# Patient Record
Sex: Female | Born: 1940
Health system: Southern US, Community
[De-identification: ages and names within clinical notes are randomized; demographics above are authoritative.]

## PROBLEM LIST (undated history)

## (undated) DIAGNOSIS — N390 Urinary tract infection, site not specified: Secondary | ICD-10-CM

## (undated) DIAGNOSIS — K5792 Diverticulitis of intestine, part unspecified, without perforation or abscess without bleeding: Secondary | ICD-10-CM

## (undated) DIAGNOSIS — K284 Chronic or unspecified gastrojejunal ulcer with hemorrhage: Secondary | ICD-10-CM

## (undated) DIAGNOSIS — E785 Hyperlipidemia, unspecified: Secondary | ICD-10-CM

## (undated) DIAGNOSIS — Q249 Congenital malformation of heart, unspecified: Secondary | ICD-10-CM

## (undated) DIAGNOSIS — M1990 Unspecified osteoarthritis, unspecified site: Secondary | ICD-10-CM

## (undated) DIAGNOSIS — F329 Major depressive disorder, single episode, unspecified: Secondary | ICD-10-CM

## (undated) DIAGNOSIS — K274 Chronic or unspecified peptic ulcer, site unspecified, with hemorrhage: Secondary | ICD-10-CM

## (undated) DIAGNOSIS — K55039 Acute (reversible) ischemia of large intestine, extent unspecified: Secondary | ICD-10-CM

## (undated) DIAGNOSIS — R51 Headache: Secondary | ICD-10-CM

## (undated) DIAGNOSIS — F32A Depression, unspecified: Secondary | ICD-10-CM

## (undated) DIAGNOSIS — K921 Melena: Secondary | ICD-10-CM

## (undated) DIAGNOSIS — I10 Essential (primary) hypertension: Secondary | ICD-10-CM

## (undated) HISTORY — DX: Hyperlipidemia, unspecified: E78.5

## (undated) HISTORY — DX: Congenital malformation of heart, unspecified: Q24.9

## (undated) HISTORY — DX: Headache: R51

## (undated) HISTORY — DX: Acute (reversible) ischemia of large intestine, extent unspecified: K55.039

## (undated) HISTORY — DX: Essential (primary) hypertension: I10

## (undated) HISTORY — DX: Unspecified osteoarthritis, unspecified site: M19.90

## (undated) HISTORY — DX: Chronic or unspecified peptic ulcer, site unspecified, with hemorrhage: K27.4

## (undated) HISTORY — DX: Major depressive disorder, single episode, unspecified: F32.9

## (undated) HISTORY — DX: Urinary tract infection, site not specified: N39.0

## (undated) HISTORY — DX: Diverticulitis of intestine, part unspecified, without perforation or abscess without bleeding: K57.92

## (undated) HISTORY — DX: Depression, unspecified: F32.A

## (undated) HISTORY — DX: Melena: K92.1

## (undated) HISTORY — PX: OTHER SURGICAL HISTORY: SHX169

## (undated) HISTORY — DX: Chronic or unspecified gastrojejunal ulcer with hemorrhage: K28.4

---

## 2000-06-02 ENCOUNTER — Encounter: Admission: RE | Admit: 2000-06-02 | Discharge: 2000-06-02 | Payer: Self-pay | Admitting: Internal Medicine

## 2005-12-09 ENCOUNTER — Emergency Department (HOSPITAL_COMMUNITY): Admission: EM | Admit: 2005-12-09 | Discharge: 2005-12-09 | Payer: Self-pay | Admitting: Emergency Medicine

## 2009-09-23 ENCOUNTER — Ambulatory Visit: Payer: Self-pay | Admitting: Cardiology

## 2009-11-04 ENCOUNTER — Ambulatory Visit: Payer: Self-pay | Admitting: Cardiology

## 2010-01-18 DIAGNOSIS — K55039 Acute (reversible) ischemia of large intestine, extent unspecified: Secondary | ICD-10-CM

## 2010-01-18 HISTORY — DX: Acute (reversible) ischemia of large intestine, extent unspecified: K55.039

## 2010-01-22 ENCOUNTER — Inpatient Hospital Stay (HOSPITAL_COMMUNITY)
Admission: EM | Admit: 2010-01-22 | Discharge: 2010-01-23 | Payer: Self-pay | Source: Home / Self Care | Attending: Internal Medicine | Admitting: Internal Medicine

## 2010-01-22 LAB — MRSA PCR SCREENING: MRSA by PCR: NEGATIVE

## 2010-01-22 LAB — CBC
HCT: 40.9 % (ref 36.0–46.0)
HCT: 36.4 % (ref 36.0–46.0)
Hemoglobin: 13.4 g/dL (ref 12.0–15.0)
Hemoglobin: 11.8 g/dL — ABNORMAL LOW (ref 12.0–15.0)
MCH: 30.4 pg (ref 26.0–34.0)
MCH: 30.2 pg (ref 26.0–34.0)
MCHC: 32.8 g/dL (ref 30.0–36.0)
MCHC: 32.4 g/dL (ref 30.0–36.0)
MCV: 92.7 fL (ref 78.0–100.0)
MCV: 93.1 fL (ref 78.0–100.0)
Platelets: 209 10*3/uL (ref 150–400)
Platelets: 237 10*3/uL (ref 150–400)
RBC: 4.41 MIL/uL (ref 3.87–5.11)
RBC: 3.91 MIL/uL (ref 3.87–5.11)
RDW: 12.6 % (ref 11.5–15.5)
RDW: 12.8 % (ref 11.5–15.5)
WBC: 13.4 10*3/uL — ABNORMAL HIGH (ref 4.0–10.5)
WBC: 12.9 10*3/uL — ABNORMAL HIGH (ref 4.0–10.5)

## 2010-01-22 LAB — COMPREHENSIVE METABOLIC PANEL
ALT: 27 U/L (ref 0–35)
AST: 23 U/L (ref 0–37)
Albumin: 4.8 g/dL (ref 3.5–5.2)
Alkaline Phosphatase: 69 U/L (ref 39–117)
BUN: 40 mg/dL — ABNORMAL HIGH (ref 6–23)
CO2: 22 mEq/L (ref 19–32)
Calcium: 10.3 mg/dL (ref 8.4–10.5)
Chloride: 104 mEq/L (ref 96–112)
Creatinine, Ser: 1.44 mg/dL — ABNORMAL HIGH (ref 0.4–1.2)
GFR calc Af Amer: 44 mL/min — ABNORMAL LOW (ref >60)
GFR calc non Af Amer: 36 mL/min — ABNORMAL LOW (ref >60)
Glucose, Bld: 155 mg/dL — ABNORMAL HIGH (ref 70–99)
Potassium: 4.1 mEq/L (ref 3.5–5.1)
Sodium: 137 mEq/L (ref 135–145)
Total Bilirubin: 0.6 mg/dL (ref 0.3–1.2)
Total Protein: 8.6 g/dL — ABNORMAL HIGH (ref 6.0–8.3)

## 2010-01-22 LAB — PROTIME-INR
INR: 1.02 (ref 0.00–1.49)
Prothrombin Time: 13.6 seconds (ref 11.6–15.2)

## 2010-01-22 LAB — ABO/RH: ABO/RH(D): O POS

## 2010-01-22 LAB — APTT: aPTT: 31 seconds (ref 24–37)

## 2010-01-22 LAB — LACTIC ACID, PLASMA: Lactic Acid, Venous: 1.3 mmol/L (ref 0.5–2.2)

## 2010-01-22 LAB — TYPE AND SCREEN
ABO/RH(D): O POS
Antibody Screen: NEGATIVE

## 2010-01-23 ENCOUNTER — Encounter (INDEPENDENT_AMBULATORY_CARE_PROVIDER_SITE_OTHER): Payer: Self-pay | Admitting: Internal Medicine

## 2010-01-23 LAB — URINALYSIS, ROUTINE W REFLEX MICROSCOPIC
Bilirubin Urine: NEGATIVE
Hemoglobin, Urine: NEGATIVE
Ketones, ur: 15 mg/dL — AB
Nitrite: NEGATIVE
Protein, ur: NEGATIVE mg/dL
Specific Gravity, Urine: 1.012 (ref 1.005–1.030)
Urine Glucose, Fasting: NEGATIVE mg/dL
Urobilinogen, UA: 0.2 mg/dL (ref 0.0–1.0)
pH: 5.5 (ref 5.0–8.0)

## 2010-01-23 LAB — DIFFERENTIAL
Basophils Absolute: 0 10*3/uL (ref 0.0–0.1)
Basophils Relative: 0 % (ref 0–1)
Eosinophils Absolute: 0.1 10*3/uL (ref 0.0–0.7)
Eosinophils Relative: 1 % (ref 0–5)
Lymphocytes Relative: 17 % (ref 12–46)
Lymphs Abs: 1.7 10*3/uL (ref 0.7–4.0)
Monocytes Absolute: 1 10*3/uL (ref 0.1–1.0)
Monocytes Relative: 10 % (ref 3–12)
Neutro Abs: 7.4 10*3/uL (ref 1.7–7.7)
Neutrophils Relative %: 72 % (ref 43–77)

## 2010-01-23 LAB — BASIC METABOLIC PANEL
BUN: 18 mg/dL (ref 6–23)
CO2: 24 mEq/L (ref 19–32)
Calcium: 8.8 mg/dL (ref 8.4–10.5)
Chloride: 110 mEq/L (ref 96–112)
Creatinine, Ser: 0.99 mg/dL (ref 0.4–1.2)
GFR calc Af Amer: >60 mL/min (ref >60)
GFR calc non Af Amer: 56 mL/min — ABNORMAL LOW (ref >60)
Glucose, Bld: 114 mg/dL — ABNORMAL HIGH (ref 70–99)
Potassium: 4.2 mEq/L (ref 3.5–5.1)
Sodium: 141 mEq/L (ref 135–145)

## 2010-01-23 LAB — CBC
HCT: 31.9 % — ABNORMAL LOW (ref 36.0–46.0)
HCT: 32.2 % — ABNORMAL LOW (ref 36.0–46.0)
Hemoglobin: 10.2 g/dL — ABNORMAL LOW (ref 12.0–15.0)
Hemoglobin: 10.3 g/dL — ABNORMAL LOW (ref 12.0–15.0)
MCH: 30.1 pg (ref 26.0–34.0)
MCH: 30.3 pg (ref 26.0–34.0)
MCHC: 32 g/dL (ref 30.0–36.0)
MCHC: 32 g/dL (ref 30.0–36.0)
MCV: 94.1 fL (ref 78.0–100.0)
MCV: 94.7 fL (ref 78.0–100.0)
Platelets: 149 10*3/uL — ABNORMAL LOW (ref 150–400)
Platelets: 187 10*3/uL (ref 150–400)
RBC: 3.39 MIL/uL — ABNORMAL LOW (ref 3.87–5.11)
RBC: 3.4 MIL/uL — ABNORMAL LOW (ref 3.87–5.11)
RDW: 13 % (ref 11.5–15.5)
RDW: 13 % (ref 11.5–15.5)
WBC: 10.3 10*3/uL (ref 4.0–10.5)
WBC: 10.8 10*3/uL — ABNORMAL HIGH (ref 4.0–10.5)

## 2010-01-23 LAB — LIPID PANEL
Cholesterol: 132 mg/dL (ref 0–200)
HDL: 58 mg/dL (ref >39)
LDL Cholesterol: 61 mg/dL (ref 0–99)
Total CHOL/HDL Ratio: 2.3 RATIO
Triglycerides: 67 mg/dL (ref <150)
VLDL: 13 mg/dL (ref 0–40)

## 2010-02-02 LAB — URINE CULTURE
Colony Count: 75000
Culture  Setup Time: 201201061139
Special Requests: NEGATIVE

## 2010-02-06 NOTE — H&P (Signed)
Madison Hernandez, Madison Hernandez               ACCOUNT NO.:  [id number]  MEDICAL RECORD NO.:  [id number]          PATIENT TYPE:  EMS  LOCATION:  ED                           FACILITY:  [facility]  PHYSICIAN:  Hartley Barefoot, MD    DATE OF BIRTH:  1940-11-13  DATE OF ADMISSION:  01/22/2010 DATE OF DISCHARGE:                             HISTORY & PHYSICAL   PRIMARY CARE PROVIDER:  [facility].  CARDIOLOGIST:  Colleen Can. Deborah Chalk, MD  The patient is being admitted triad hospitalist [facility].  CHIEF COMPLAINT:  Bright red blood per rectum and abdominal pain.  HISTORY OF PRESENT ILLNESS:  [name] is a very pleasant 70 year old female with a history of hypertension, hypercholesterolemia, and remote peptic ulcer disease, who presented to the [facility] ED this morning from home with a chief complaint of abdominal pain and bright red blood per rectum.  Information is obtained from the patient.  She states that she was in her usual state of health until she was awakened late last night with diffuse abdominal pain.  She describes the pain as sharp, crampy like.  She felt urge to have a BM that was loose, but she is unsure of the color as she did not look.  She states that she then became nauseated, diaphoretic.  Shortly thereafter, she vomited a small amount of emesis and again did not look, so she is unsure of its color or if it had any coffee-ground substance.  She states this episode lasted about 45 minutes.  She then went back to bed, rested for several hours, and later had a reoccurrence of the abdominal pain and another large loose dark stool as well as bright red blood.  She indicates that she saw bright red blood in her stool as well as on her toilet paper. She also indicates that the stool was very dark.  She then called for assistance and came to the emergency room.  She denies any chest pain, palpitations, shortness of breath.  She does endorse some chills and indicates that she  has suffered with chronic back pain and takes 2 to 3 Aleve 3 to 5 days a week and has done so for prolonged period of time. Information from the patient is quite mixed in terms of details around time frames and clinical symptoms.  She indicates that about 25 years ago she was hospitalized for "bleeding ulcer."  She has not had colonoscopy that she can remember or endoscopy.  She had 1 small BM while in the emergency room that did have bright red blood.  Symptoms came on suddenly, have persisted or characterized as moderate to severe. We are asked to admit for further evaluation and treatment.  ALLERGIES:  No known drug allergies.  PAST MEDICAL HISTORY: 1. Hypertension. 2. Hypercholesterolemia. 3. Chronic back pain. 4. Remote history of peptic ulcer disease.  PAST SURGICAL HISTORY:  None.  FAMILY MEDICAL HISTORY:  Her mother was deceased.  She was in her 90s. She had diabetes and hypertension and died of an MI.  Father was deceased and his medical history is unknown.  She has 3 siblings, they  are collective.  Medical history is positive for hypertension.  Negative for heart disease, GI bleed, stroke, cancer.  SOCIAL HISTORY:  She lives alone.  She is divorced.  She denies tobacco use.  Denies EtOH.  Denies drug use.  She is retired from [employer].  MEDICATIONS: 1. Crestor 10 mg p.o. daily 2. Toprol-XL 50 mg p.o. daily. 3. Amlodipine and benazepril 5/20 mg p.o. daily.  REVIEW OF SYSTEMS:  10-point system review is negative except for as stated in HPI.  LABORATORY DATA:  Sodium 137, potassium 4.1, chloride 104, CO2 22, BUN 40, creatinine 1.44, glucose 155.  WBC 13.4, hemoglobin 13.4, hematocrit 40.9, platelets 209.  PTT 31, PT 13.6, INR 1.02.  FOBT positive.  RADIOLOGY:  Acute abdominal series yield no acute findings.  PHYSICAL EXAMINATION:  VITAL SIGNS:  T 97.4, BP 123/64, HR 103, respiration 18, sats 97. GENERAL:  Awake, alert, well nourished, somewhat ill  appearing, no acute distress. HEENT:  Head, normocephalic, atraumatic.  Pupils equal, round, reactive to light.  EOMI.  Mucous membranes of her mouth are moist and pink.  No obvious lesion or exudate in her nose or ears. NECK:  Supple.  No JVD.  Full range of motion.  No lymphadenopathy. CV: Tachycardic, but regular.  No murmur, gallop, or rub.  No lower extremity edema.  Pedal pulses present and palpable. RESPIRATORY:  No increased work of breathing.  Breath sounds clear to auscultation bilaterally.  No rhonchi, wheezes, or rales. ABDOMEN:  Round, soft.  Positive bowel sounds, but sluggish.  Mild tenderness to palpation.  No mass or organomegaly noted. NEURO:  Alert and oriented x3.  Speech clear.  Facial symmetry. MUSCULOSKELETAL:  Moves all extremities.  No joint swelling/erythema. Full range of motion.  ASSESSMENT AND PLAN: 1. Bright red blood per rectum and melena, etiology unclear. Differential includes    diverticular bleed, peptic ulcer, gastritis, hemorrhoids, Ischemic colitis.     We will admit to step-down. We will get serial CBCs. We will provide Protonix.      We will keep n.p.o.  We will type and screen.  We will request a GI consult.  2. Abdominal pain. Differential: colitis, PUD, Gastritis.      We will hold off on a CT scan given her creatinine is 1.5,     request a GI consult, provide pain medicines. Will check lactic acid.  3. Leukocytosis.  We will workup for infection, may be secondary to     infectious colitis versus stress.  We will check a UA, lactoferrin,     We will cover with Cipro and Flagyl for now. 4. Acute renal failure, creatinine 1.4, probably prerenal secondary to     dehydration/diarrhea.  We will hold nephrotoxins.  We will gently     hydrate with IV fluids. We will check BMET in the a.m.      Increase BUN could be from upper GI bleed.  5. Hypertension.  Blood pressure is currently 123/64.  We will hold     antihypertensives for now. 6.  Hypercholesterolemia.  We will check a fasting lipid panel in the     a.m.  We will hold Crestor for now secondary to #1. 7. Deep venous thrombosis prophylaxis, we will use SCDs. 8. Code status.  The patient is a full code.  This assessment and plan was discussed with Dr. Sunnie Nielsen.     Gwenyth Bender, NP   ______________________________ Hartley Barefoot, MD    KMB/MEDQ  D:  01/22/2010  T:  01/22/2010  Job:  [id number]  cc:   Colleen Can. Deborah Chalk, M.D. Fax: [phone]  [facility]  Electronically Signed by Hartley Barefoot MD on 01/22/2010 05:59:52 PM Electronically Signed by Toya Smothers  on 02/06/2010 08:25:50 AM

## 2010-02-12 LAB — OCCULT BLOOD, POC DEVICE: Fecal Occult Bld: POSITIVE

## 2010-04-13 ENCOUNTER — Ambulatory Visit: Payer: Self-pay | Admitting: Cardiology

## 2010-04-29 ENCOUNTER — Telehealth: Payer: Self-pay | Admitting: Cardiology

## 2010-04-29 NOTE — Telephone Encounter (Signed)
Spoke with Britta Mccreedy (pt's sister) and RN will fax [facility] for Amlodipine 5 mg and Benazepril 20 mg one po daily #90 with three refills.

## 2010-04-29 NOTE — Telephone Encounter (Signed)
ABOUT AMLOIPINE/DRUG PLAN WILL NOT APPROVE BUT WILL IF SEPARATED AT NO CHARGE. CHART IN BOX.

## 2010-05-26 ENCOUNTER — Emergency Department (HOSPITAL_COMMUNITY)
Admission: EM | Admit: 2010-05-26 | Discharge: 2010-05-26 | Disposition: A | Discharge status: Home / Self Care | Payer: Medicare Other | Attending: Emergency Medicine | Admitting: Emergency Medicine

## 2010-05-26 ENCOUNTER — Emergency Department (HOSPITAL_COMMUNITY): Payer: Medicare Other

## 2010-05-26 DIAGNOSIS — R51 Headache: Secondary | ICD-10-CM | POA: Insufficient documentation

## 2010-05-26 DIAGNOSIS — I10 Essential (primary) hypertension: Secondary | ICD-10-CM | POA: Insufficient documentation

## 2010-05-26 DIAGNOSIS — E7800 Pure hypercholesterolemia, unspecified: Secondary | ICD-10-CM | POA: Insufficient documentation

## 2010-06-21 LAB — HM COLONOSCOPY

## 2010-07-07 ENCOUNTER — Encounter: Payer: Self-pay | Admitting: Cardiovascular Disease

## 2010-07-08 ENCOUNTER — Encounter: Payer: Self-pay | Admitting: Cardiovascular Disease

## 2010-07-08 ENCOUNTER — Ambulatory Visit (INDEPENDENT_AMBULATORY_CARE_PROVIDER_SITE_OTHER): Payer: Medicare Other | Admitting: Cardiovascular Disease

## 2010-07-08 DIAGNOSIS — E785 Hyperlipidemia, unspecified: Secondary | ICD-10-CM

## 2010-07-08 DIAGNOSIS — Z79899 Other long term (current) drug therapy: Secondary | ICD-10-CM

## 2010-07-08 DIAGNOSIS — E782 Mixed hyperlipidemia: Secondary | ICD-10-CM

## 2010-07-08 DIAGNOSIS — R9431 Abnormal electrocardiogram [ECG] [EKG]: Secondary | ICD-10-CM

## 2010-07-08 DIAGNOSIS — R002 Palpitations: Secondary | ICD-10-CM

## 2010-07-08 DIAGNOSIS — I10 Essential (primary) hypertension: Secondary | ICD-10-CM | POA: Insufficient documentation

## 2010-07-08 LAB — LIPID PANEL
HDL: 57 mg/dL (ref >39.00)
Total CHOL/HDL Ratio: 5
Triglycerides: 158 mg/dL — ABNORMAL HIGH (ref 0.0–149.0)

## 2010-07-08 LAB — HEPATIC FUNCTION PANEL
ALT: 31 U/L (ref 0–35)
Albumin: 4.7 g/dL (ref 3.5–5.2)
Total Bilirubin: 0.5 mg/dL (ref 0.3–1.2)

## 2010-07-08 LAB — LDL CHOLESTEROL, DIRECT: Direct LDL: 228.2 mg/dL

## 2010-07-08 NOTE — Assessment & Plan Note (Signed)
Well controlled.  Continue current medications and low sodium Dash type diet.    

## 2010-07-08 NOTE — Patient Instructions (Signed)
Your physician recommends that you schedule a follow-up appointment in: 6 MONTHS WITH DR [facility]  Your physician recommends that you continue on your current medications as directed. Please refer to the Current Medication list given to you today.  Your physician recommends that you return for lab work in: TODAY LIPID LIVER DX 272.4 V58.69

## 2010-07-08 NOTE — Assessment & Plan Note (Signed)
Cholesterol is at goal.  Continue current dose of statin and diet Rx.  No myalgias or side effects.  F/U  LFT's in 6 months. Lab Results  Component Value Date   LDLCALC  Value: 61        Total Cholesterol/HDL:CHD Risk Coronary Heart Disease Risk Table                     Men   Women  1/2 Average Risk   3.4   3.3  Average Risk       5.0   4.4  2 X Average Risk   9.6   7.1  3 X Average Risk  23.4   11.0        Use the calculated Patient Ratio above and the CHD Risk Table to determine the patient's CHD Risk.        ATP III CLASSIFICATION (LDL):  <100     mg/dL   Optimal  161-096  mg/dL   Near or Above                    Optimal  130-159  mg/dL   Borderline  045-409  mg/dL   High  >811     mg/dL   Very High 09/18/4780  Will have lab work today. No myalgias

## 2010-07-08 NOTE — Progress Notes (Signed)
70 yo divorced femal new to me.  Previously seen by Dr Deborah Chalk.  Reviewed his office notes for the past 3 years.  History of palpitations improved with beta blocker.  CRF;s HTN and elevated lipids.  No real primary care doctor.  Goes to urgent care sporadically.  Dr Deborah Chalk usually checks lab work.  Needs lipid and liver today.  On stable BP regiman for last 2 years except calcium blocker and ACE separated.  Caffeine makes palpitatons worse and she tries to minimize.  No known structural heart disease.  Previous echo and ETT normal in last 5 years.  No SSCP, syncope.  Mild LE edema with previous vein procedures in both legs ? Varicosities  ROS: Denies fever, malais, weight loss, blurry vision, decreased visual acuity, cough, sputum, SOB, hemoptysis, pleuritic pain, palpitaitons, heartburn, abdominal pain, melena, lower extremity edema, claudication, or rash.  All other systems reviewed and negative   General: Affect appropriate Healthy:  appears stated age HEENT: normal Neck supple with no adenopathy JVP normal no bruits no thyromegaly Lungs clear with no wheezing and good diaphragmatic motion Heart:  S1/S2 no murmur,rub, gallop or click PMI normal Abdomen: benighn, BS positve, no tenderness, no AAA no bruit.  No HSM or HJR Distal pulses intact with no bruits No edema Neuro non-focal Skin warm and dry No muscular weakness  Medications Current Outpatient Prescriptions  Medication Sig Dispense Refill  . amLODipine (NORVASC) 5 MG tablet Take 5 mg by mouth daily.        . benazepril (LOTENSIN) 20 MG tablet Take 20 mg by mouth daily.        . metoprolol (TOPROL-XL) 50 MG 24 hr tablet Take 50 mg by mouth daily.        . rosuvastatin (CRESTOR) 10 MG tablet Take 10 mg by mouth daily.          Allergies Review of patient's allergies indicates no known allergies.  Family History: Family History  Problem Relation Age of Onset  . Heart disease Mother     age 9    Social  History: History   Social History  . Marital Status: Single    Spouse Name: N/A    Number of Children: N/A  . Years of Education: N/A   Occupational History  . Not on file.   Social History Main Topics  . Smoking status: Former Smoker    Types: Cigarettes    Quit date: 01/19/1983  . Smokeless tobacco: Never Used  . Alcohol Use: Not on file  . Drug Use: Not on file  . Sexually Active: Not on file   Other Topics Concern  . Not on file   Social History Narrative   She retired early. She is non smoker. Never knew her father. Her mother died age 49 of heart disease. Her half sister Lucrezia [place]    Electrocardiogram:  NSR 60 Nonspecific ST/T wave changes QT 478  Assessment and Plan

## 2010-07-08 NOTE — Assessment & Plan Note (Signed)
Nonspecific chagnes.  Compared to one from [facility] Ed office a year ago and no change

## 2010-07-08 NOTE — Assessment & Plan Note (Signed)
Improved on beta blocker  

## 2010-07-20 ENCOUNTER — Telehealth: Payer: Self-pay | Admitting: Cardiovascular Disease

## 2010-07-20 NOTE — Telephone Encounter (Signed)
Pt needs refill on crestor 10mg  qd / amlodipine 5mg  qd / benazepril 20mg  qd / called into [facility]

## 2010-07-20 NOTE — Telephone Encounter (Signed)
Pt needs blood test results

## 2010-07-20 NOTE — Telephone Encounter (Signed)
Spoke with pt, she has not been taking the crestor due to muscle aches. She states she has been on all the meds and is unable to tolerate.she is not interested in seeing the lipid clinic. She states she is going to stasrt back on the crestor [employer]

## 2010-07-21 MED ORDER — ROSUVASTATIN CALCIUM 10 MG PO TABS: 10.0000 mg | ORAL_TABLET | Freq: Every day | ORAL | Status: DC

## 2010-07-21 MED ORDER — AMLODIPINE BESYLATE 5 MG PO TABS: 5.0000 mg | ORAL_TABLET | Freq: Every day | ORAL | Status: DC

## 2010-07-21 MED ORDER — BENAZEPRIL HCL 20 MG PO TABS: 20.0000 mg | ORAL_TABLET | Freq: Every day | ORAL | Status: DC

## 2010-08-21 ENCOUNTER — Ambulatory Visit (INDEPENDENT_AMBULATORY_CARE_PROVIDER_SITE_OTHER): Payer: Medicare Other | Admitting: Family Medicine

## 2010-08-21 ENCOUNTER — Encounter: Payer: Self-pay | Admitting: Family Medicine

## 2010-08-21 DIAGNOSIS — I10 Essential (primary) hypertension: Secondary | ICD-10-CM

## 2010-08-21 DIAGNOSIS — K55039 Acute (reversible) ischemia of large intestine, extent unspecified: Secondary | ICD-10-CM | POA: Insufficient documentation

## 2010-08-21 DIAGNOSIS — G43909 Migraine, unspecified, not intractable, without status migrainosus: Secondary | ICD-10-CM | POA: Insufficient documentation

## 2010-08-21 DIAGNOSIS — E785 Hyperlipidemia, unspecified: Secondary | ICD-10-CM

## 2010-08-21 DIAGNOSIS — Z8659 Personal history of other mental and behavioral disorders: Secondary | ICD-10-CM

## 2010-08-21 NOTE — Progress Notes (Signed)
  Subjective:    Patient ID: Madison Hernandez, female    DOB: 14-Sep-1940, 70 y.o.   MRN: [id number]  HPI New patient to establish care. Has been seen by local cardiology group. She is treated for hypertension hyperlipidemia. She relates history of osteoarthritis, past history of depression, diverticular disease of colon and history of migraine headaches. She had colonoscopy in January of this year which apparently showed some ischemic colitis. No flareups since then. Medications reviewed. Compliant with all. Blood pressure is well controlled. No history of CAD.  History of allergy with Avelox. Family history significant for mother diabetes and hypertension. Sister with hypertension.  Colonoscopy in January. Nonsmoker. Smoked only briefly in her 25s. No alcohol use. Last tetanus over 10 years ago. Pneumovax last year. No recent mammogram   Review of Systems  Constitutional: Negative for fever, activity change, appetite change and fatigue.  HENT: Negative for hearing loss, ear pain, sore throat and trouble swallowing.   Eyes: Negative for visual disturbance.  Respiratory: Negative for cough and shortness of breath.   Cardiovascular: Negative for chest pain and palpitations.  Gastrointestinal: Negative for abdominal pain, diarrhea, constipation and blood in stool.  Genitourinary: Negative for dysuria and hematuria.  Musculoskeletal: Negative for myalgias, back pain and arthralgias.  Skin: Negative for rash.  Neurological: Negative for dizziness, syncope and headaches.  Hematological: Negative for adenopathy.  Psychiatric/Behavioral: Negative for confusion and dysphoric mood.       Objective:   Physical Exam  Constitutional: She is oriented to person, place, and time. She appears well-developed and well-nourished.  HENT:  Head: Normocephalic and atraumatic.  Eyes: EOM are normal. Pupils are equal, round, and reactive to light.  Neck: Normal range of motion. Neck supple. No thyromegaly  present.  Cardiovascular: Normal rate, regular rhythm and normal heart sounds.   No murmur heard. Pulmonary/Chest: Breath sounds normal. No respiratory distress. She has no wheezes. She has no rales.  Abdominal: Soft. Bowel sounds are normal. She exhibits no distension and no mass. There is no tenderness. There is no rebound and no guarding.  Musculoskeletal: Normal range of motion. She exhibits no edema.  Lymphadenopathy:    She has no cervical adenopathy.  Neurological: She is alert and oriented to person, place, and time. She displays normal reflexes. No cranial nerve deficit.  Skin: No rash noted.  Psychiatric: She has a normal mood and affect. Her behavior is normal. Judgment and thought content normal.          Assessment & Plan:  #1 hypertension stable #2 hyperlipidemia #3 recent low back pain resolved at this time #4 history of migraine headaches  #5 history of ischemic colitis #6 health maintenance. Patient not had complete physical in several years and we have suggested scheduling in next few months. Address issues such as tetanus, shingles vaccine, mammogram, and discuss Pap smear

## 2010-08-25 ENCOUNTER — Encounter: Payer: Self-pay | Admitting: Family Medicine

## 2010-08-25 ENCOUNTER — Ambulatory Visit (INDEPENDENT_AMBULATORY_CARE_PROVIDER_SITE_OTHER): Payer: Medicare Other | Admitting: Family Medicine

## 2010-08-25 VITALS — BP 130/78 | Temp 98.1°F | Wt 158.0 lb

## 2010-08-25 DIAGNOSIS — R58 Hemorrhage, not elsewhere classified: Secondary | ICD-10-CM

## 2010-08-25 LAB — CBC WITH DIFFERENTIAL/PLATELET
Basophils Absolute: 0 10*3/uL (ref 0.0–0.1)
Hemoglobin: 12.3 g/dL (ref 12.0–15.0)
Lymphocytes Relative: 31 % (ref 12.0–46.0)
Monocytes Relative: 8.3 % (ref 3.0–12.0)
Neutro Abs: 3.5 10*3/uL (ref 1.4–7.7)
Neutrophils Relative %: 56.9 % (ref 43.0–77.0)
RDW: 13.2 % (ref 11.5–14.6)

## 2010-08-25 NOTE — Progress Notes (Signed)
  Subjective:    Patient ID: Madison Hernandez, female    DOB: 01-01-1941, 70 y.o.   MRN: [id number]  HPI Patient woke up with blood in mouth and on her bed sheets and clothing last night. No history of similar bleeding. She denies any recent obvious nosebleed. No hematemesis. No hemoptysis or cough. No dyspnea or pleuritic pain. She denies any abdominal pain or melena. No obvious mouth ulcers or sores. No difficulty with swallowing. Denies nausea or vomiting. No gum bleeding. No easy bruising. Rarely takes aspirin and none in several days.  Past Medical History  Diagnosis Date  . Bleeding ulcer     22 years ago  . Hypertension   . Dyslipidemia   . Diverticulitis   . Arthritis   . Blood in stool   . Depression   . Headache   . Cardiac arrhythmia due to congenital heart disease   . Hyperlipidemia   . UTI (urinary tract infection)   . Acute ischemic colitis 1/12  . Migraine    Past Surgical History  Procedure Date  . Bleeding ulcers     22 years ago  . Vein procedures     right and left lower extremity vein procedures    reports that she quit smoking about 27 years ago. Her smoking use included Cigarettes. She has never used smokeless tobacco. Her alcohol and drug histories not on file. family history includes Diabetes in her mother; Heart disease in her mother; and Hypertension in her mother and sister. Allergies  Allergen Reactions  . Avelox (Moxifloxacin Hcl In Nacl) Shortness Of Breath      Review of Systems  Constitutional: Negative for fever, chills and unexpected weight change.  HENT: Negative for sore throat, trouble swallowing and sinus pressure.   Respiratory: Negative for cough and shortness of breath.   Cardiovascular: Negative for chest pain.  Gastrointestinal: Negative for nausea, vomiting, abdominal pain and blood in stool.  Neurological: Negative for dizziness, syncope and headaches.  Hematological: Does not bruise/bleed easily.       Objective:   Physical Exam  Constitutional: She appears well-developed and well-nourished.  HENT:  Right Ear: External ear normal.  Left Ear: External ear normal.  Nose: Nose normal.  Mouth/Throat: Oropharynx is clear and moist.       No evidence of oropharyngeal ulcers or lesions.  No evidence for intranasal bleed  Neck: Neck supple.  Cardiovascular: Normal rate and regular rhythm.   Pulmonary/Chest: Effort normal and breath sounds normal. No respiratory distress. She has no wheezes. She has no rales.  Abdominal: Soft. There is no tenderness.  Lymphadenopathy:    She has no cervical adenopathy.          Assessment & Plan:  Bleeding. Source not determined.  No evidence to suggest hematemesis or hemoptysis.  By history, suspect this may have been a posterior nosebleed. No evidence for persistent bleeding at this time. Check CBC. Avoid aspirin.  followup promptly for any recurrent episodes

## 2010-08-25 NOTE — Patient Instructions (Signed)
Avoid aspirin for the next few days. Follow up promptly for any recurrent bleeding or other problems. Stay out of heat for next couple of days.

## 2010-08-26 ENCOUNTER — Telehealth: Payer: Self-pay

## 2010-08-26 NOTE — Telephone Encounter (Signed)
Message copied by Beverely Low on Wed Aug 26, 2010  9:40 AM ------      Message from: Kristian Covey      Created: Tue Aug 25, 2010 11:03 PM       CBC normal.

## 2010-08-26 NOTE — Telephone Encounter (Signed)
Pt.notified

## 2010-09-02 ENCOUNTER — Other Ambulatory Visit: Payer: Self-pay | Admitting: Cardiovascular Disease

## 2010-09-02 ENCOUNTER — Emergency Department (HOSPITAL_COMMUNITY)
Admission: EM | Admit: 2010-09-02 | Discharge: 2010-09-02 | Disposition: A | Discharge status: Home / Self Care | Payer: Medicare Other | Attending: Emergency Medicine | Admitting: Emergency Medicine

## 2010-09-02 DIAGNOSIS — I10 Essential (primary) hypertension: Secondary | ICD-10-CM | POA: Insufficient documentation

## 2010-09-02 DIAGNOSIS — E7800 Pure hypercholesterolemia, unspecified: Secondary | ICD-10-CM | POA: Insufficient documentation

## 2010-09-02 DIAGNOSIS — M549 Dorsalgia, unspecified: Secondary | ICD-10-CM | POA: Insufficient documentation

## 2010-09-03 ENCOUNTER — Encounter: Payer: Self-pay | Admitting: Family Medicine

## 2010-09-03 ENCOUNTER — Ambulatory Visit (INDEPENDENT_AMBULATORY_CARE_PROVIDER_SITE_OTHER): Payer: Medicare Other | Admitting: Family Medicine

## 2010-09-03 VITALS — BP 150/68 | Temp 98.3°F | Wt 157.0 lb

## 2010-09-03 DIAGNOSIS — M546 Pain in thoracic spine: Secondary | ICD-10-CM

## 2010-09-03 MED ORDER — METOPROLOL SUCCINATE ER 50 MG PO TB24: 50.0000 mg | ORAL_TABLET | Freq: Every day | ORAL | Status: DC

## 2010-09-03 MED ORDER — AMLODIPINE BESYLATE 5 MG PO TABS: 5.0000 mg | ORAL_TABLET | Freq: Every day | ORAL | Status: DC

## 2010-09-03 MED ORDER — BENAZEPRIL HCL 20 MG PO TABS: 20.0000 mg | ORAL_TABLET | Freq: Every day | ORAL | Status: DC

## 2010-09-03 MED ORDER — ROSUVASTATIN CALCIUM 10 MG PO TABS: 10.0000 mg | ORAL_TABLET | Freq: Every day | ORAL | Status: DC

## 2010-09-03 MED ORDER — OXYCODONE-ACETAMINOPHEN 5-325 MG PO TABS: 1.0000 | ORAL_TABLET | Freq: Four times a day (QID) | ORAL | Status: DC | PRN

## 2010-09-03 NOTE — Progress Notes (Signed)
  Subjective:    Patient ID: Madison Hernandez, female    DOB: Aug 18, 1940, 70 y.o.   MRN: [id number]  HPI Patient seen for ER followup. Onset thoracic back pain around 11 AM yesterday. Location was around T6 while exercising. She was doing some mild lifting with food cans lifting overhead while walking. Onset was acute. Quality was sharp. Severe pain initially. Went to the emergency room. Given Toradol 30 mg without help. Given Percocet which has helped. No clear exacerbating features. Pain is relieved totally this time with Percocet. History of similar occurrence in the past.  ER notes reviewed.  Patient denies any dyspnea, pleuritic pain, chest pain, cough, fever, chills, or any abdominal pain. No nausea or vomiting.   Review of Systems  Constitutional: Negative for fever, chills, appetite change, fatigue and unexpected weight change.  Respiratory: Negative for cough, shortness of breath and stridor.   Cardiovascular: Negative for chest pain, palpitations and leg swelling.  Gastrointestinal: Negative for abdominal pain and blood in stool.  Musculoskeletal: Negative for gait problem.  Neurological: Negative for dizziness, syncope and weakness.  Hematological: Negative for adenopathy.       Objective:   Physical Exam  Constitutional: She is oriented to person, place, and time. She appears well-developed and well-nourished. No distress.  Neck: Neck supple.  Cardiovascular: Normal rate, regular rhythm and normal heart sounds.   Pulmonary/Chest: Effort normal and breath sounds normal. No respiratory distress. She has no wheezes. She has no rales.  Abdominal: Soft. Bowel sounds are normal. She exhibits no distension and no mass. There is no tenderness. There is no rebound and no guarding.  Musculoskeletal: She exhibits no edema.       Thoracic spine is nontender. Minimal left parathoracic muscular tenderness. No visible rash  Lymphadenopathy:    She has no cervical adenopathy.  Neurological:  She is alert and oriented to person, place, and time.  Psychiatric: She has a normal mood and affect. Her behavior is normal.          Assessment & Plan:  Thoracic back pain. Suspect musculoskeletal. By history, no suggestion of pulmonary etiology.  No suspicion of abdominal or cardiac etiology.  Spine nontender so do not suspect acute compression fracture.   Pain is fully relieved at this time. Refilled Percocet 5/325 mg one every 6 hours #30 for when necessary use. Followup immediately for worsening symptoms. Consider thoracic spine films if no better by next week

## 2010-09-03 NOTE — Patient Instructions (Signed)
Follow up immediately for any chest pain, shortness of breath, fever, or worsening pain. Also, follow up if pain no better by next week.

## 2010-09-18 ENCOUNTER — Ambulatory Visit (INDEPENDENT_AMBULATORY_CARE_PROVIDER_SITE_OTHER): Payer: Medicare Other | Admitting: Family Medicine

## 2010-09-18 ENCOUNTER — Encounter: Payer: Self-pay | Admitting: Family Medicine

## 2010-09-18 VITALS — BP 140/70 | Temp 98.2°F | Wt 156.0 lb

## 2010-09-18 DIAGNOSIS — M546 Pain in thoracic spine: Secondary | ICD-10-CM

## 2010-09-18 DIAGNOSIS — M549 Dorsalgia, unspecified: Secondary | ICD-10-CM

## 2010-09-18 NOTE — Progress Notes (Signed)
  Subjective:    Patient ID: Madison Hernandez, female    DOB: 03/14/1940, 71 y.o.   MRN: [id number]  HPI Persistent thoracic back pain. Refer to prior note. Pain remains localized left of thoracic spine around T6. No rash. Sharp quality. Moderate severity. Relatively constant. Heat with temporary relief. No relief with ice. She's tried Advil, Aleve and had Toradol injection through emergency department without relief. Percocet helps slightly but had nausea and does not wish to stay on this. She's had some mild recent weight loss but hasscaled back calorie consumption and has lost some weight due to her efforts. Good appetite. Denies fever, chills, pleuritic pain, dyspnea, or abdominal pain   Review of Systems  Constitutional: Negative for fever, chills and appetite change.  Respiratory: Negative for cough, shortness of breath and wheezing.   Cardiovascular: Negative for chest pain and leg swelling.  Skin: Negative for rash.  Hematological: Negative for adenopathy.       Objective:   Physical Exam  Constitutional: She appears well-developed and well-nourished.  Neck: Neck supple.  Cardiovascular: Normal rate and regular rhythm.   Pulmonary/Chest: Effort normal and breath sounds normal. No respiratory distress. She has no wheezes. She has no rales.  Musculoskeletal:       No thoracic spine tenderness. She has area muscular tenderness as previously noted just left of thoracic spine around T6. No rash. No overlying skin changes. No erythema or ecchymosis. No visible swelling.  Lymphadenopathy:    She has no cervical adenopathy.          Assessment & Plan:  Thoracic back pain. Question trigger point. Discussed risk and benefits of trial of injection and patient consents.  Prepped skin with betadine over area of muscular tenderness R parathoracic area.  Using 25 gouge 5/8 inch needle injected 40 mg depomedrol and 1 cc plain xylocaine and pt tolerated well.  If no improvement thoracic spine  films by next week.

## 2010-09-18 NOTE — Patient Instructions (Signed)
Trigger Point Therapy Your exam shows your pain is coming from one or more trigger points. These points are places where the muscles of the neck, shoulder, back, and legs are tender to touch and cause pain, spasm, and fatigue. This condition can cause headaches, painful neck spasms, and low back pain. Trigger points can be thought of as weak areas of muscle that spasm and form hard knots when under chronic strain or stress. Treatment of trigger points may include medicines to reduce pain and inflammation and relax the muscles. Physical therapy can include several techniques. Deep friction massage over the tender areas can often help relieve pain and stiffness. Relaxation exercises and stretching may be helpful; cervical and lumbar traction can be used in severe cases. Cold therapy and hot packs have both proven helpful. Injection therapy with saline, anesthetics, or cortisone medicines, has also been very successful at reducing symptoms. Two or more injections are often needed over several weeks to gain the greatest benefit. Call your doctor to arrange for further treatment as recommended. Document Released: 02/12/2004 Document Re-Released: 03/31/2009 [facility] Patient Information 2011 [place], [place].  Call by next week if no improvement. Would consider thoracic spine films if no improvement. Also try local tissue massage

## 2010-09-22 ENCOUNTER — Telehealth: Payer: Self-pay | Admitting: *Deleted

## 2010-09-22 NOTE — Telephone Encounter (Signed)
Pt called to report the trigger point inj to back has helped, Dr Caryl Never wanted to know she stated.

## 2010-09-23 ENCOUNTER — Telehealth: Payer: Self-pay | Admitting: Family Medicine

## 2010-09-23 NOTE — Telephone Encounter (Signed)
Needs to be seen.  See if we can get her in tomorrow to be seen.

## 2010-09-23 NOTE — Telephone Encounter (Signed)
Pt informed, she does not want to come in again.  Pt will call back prn

## 2010-09-23 NOTE — Telephone Encounter (Signed)
Pt is having blood drain from sinus and is wanting a prescription called in. Please contact pt.

## 2010-09-23 NOTE — Telephone Encounter (Signed)
I called pt back to clarify request.  Pt states the blood is coming from her sinuses, draining down her throat, and they spitting it out, which has flecks/clots of blood.   Wondering if she needs Rx for her sinuses?

## 2010-11-20 ENCOUNTER — Ambulatory Visit: Payer: Medicare Other | Admitting: Family Medicine

## 2010-12-11 ENCOUNTER — Encounter: Payer: Self-pay | Admitting: Family Medicine

## 2010-12-11 ENCOUNTER — Ambulatory Visit (INDEPENDENT_AMBULATORY_CARE_PROVIDER_SITE_OTHER): Payer: Medicare Other | Admitting: Family Medicine

## 2010-12-11 VITALS — BP 138/78 | Temp 97.9°F | Wt 160.0 lb

## 2010-12-11 DIAGNOSIS — N644 Mastodynia: Secondary | ICD-10-CM

## 2010-12-11 MED ORDER — OXYCODONE-ACETAMINOPHEN 5-325 MG PO TABS: 1.0000 | ORAL_TABLET | Freq: Four times a day (QID) | ORAL | Status: DC | PRN

## 2010-12-11 NOTE — Progress Notes (Signed)
  Subjective:    Patient ID: Madison Hernandez, female    DOB: October 01, 1940, 70 y.o.   MRN: [id number]  HPI  Left breast itching and intermittent stinging type pain off and on for several months-if not years. No history of shingles and no recent rash. Denies any nipple discharge or nipple inversion. She has not had a mammogram in over 20 years. She denies any adenopathy and pt has not noted any breast masses. She has not had any right breast symptoms. She has not noted any areolar rashes.   Review of Systems  Constitutional: Negative for appetite change and unexpected weight change.  Skin: Negative for rash.  Hematological: Negative for adenopathy.       Objective:   Physical Exam  Constitutional: She appears well-developed and well-nourished.  Cardiovascular: Normal rate and regular rhythm.   Pulmonary/Chest: Effort normal and breath sounds normal. No respiratory distress. She has no wheezes. She has no rales.       Right and left breast exam was essentially normal. No nipple inversion. No breast masses. No skin changes such as erythema or warmth. No adenopathy          Assessment & Plan:  Left breast pain intermittently for several months if not years with basically normal exam. We have recommend she go ahead with screening mammography. She does not have evidence for cellulitis. Question neuropathic. Symptoms are relatively mild.  If mammogram normal observe.

## 2010-12-11 NOTE — Patient Instructions (Signed)
Consider setting up repeat mammogram.  

## 2010-12-14 ENCOUNTER — Other Ambulatory Visit: Payer: Self-pay | Admitting: Family Medicine

## 2010-12-14 DIAGNOSIS — Z1231 Encounter for screening mammogram for malignant neoplasm of breast: Secondary | ICD-10-CM

## 2011-01-05 ENCOUNTER — Ambulatory Visit: Payer: Medicare Other | Admitting: Cardiovascular Disease

## 2011-01-06 ENCOUNTER — Ambulatory Visit
Admission: RE | Admit: 2011-01-06 | Discharge: 2011-01-06 | Disposition: A | Discharge status: Home / Self Care | Payer: Medicare Other | Source: Ambulatory Visit | Attending: Family Medicine | Admitting: Family Medicine

## 2011-01-06 DIAGNOSIS — Z1231 Encounter for screening mammogram for malignant neoplasm of breast: Secondary | ICD-10-CM

## 2011-01-21 ENCOUNTER — Ambulatory Visit (INDEPENDENT_AMBULATORY_CARE_PROVIDER_SITE_OTHER): Payer: Medicare Other | Admitting: Cardiovascular Disease

## 2011-01-21 ENCOUNTER — Encounter: Payer: Self-pay | Admitting: Cardiovascular Disease

## 2011-01-21 DIAGNOSIS — E782 Mixed hyperlipidemia: Secondary | ICD-10-CM

## 2011-01-21 DIAGNOSIS — R5383 Other fatigue: Secondary | ICD-10-CM | POA: Insufficient documentation

## 2011-01-21 DIAGNOSIS — R5381 Other malaise: Secondary | ICD-10-CM | POA: Diagnosis not present

## 2011-01-21 DIAGNOSIS — I10 Essential (primary) hypertension: Secondary | ICD-10-CM

## 2011-01-21 DIAGNOSIS — R9431 Abnormal electrocardiogram [ECG] [EKG]: Secondary | ICD-10-CM | POA: Diagnosis not present

## 2011-01-21 DIAGNOSIS — R002 Palpitations: Secondary | ICD-10-CM

## 2011-01-21 NOTE — Patient Instructions (Signed)
Your physician wants you to follow-up in: 6 MONTHS WITH DR Haywood Filler will receive a reminder letter in the mail two months in advance. If you don't receive a letter, please call our office to schedule the follow-up appointment.  Your physician recommends that you continue on your current medications as directed. Please refer to the Current Medication list given to you today.  Your physician recommends that you return for lab work in: TODAY BMET LIPID  TSH T4  DX FATIGUE  272.4

## 2011-01-21 NOTE — Assessment & Plan Note (Signed)
She is now compliant with crestor.  This is the statin that has worked best for her.  Labs today

## 2011-01-21 NOTE — Progress Notes (Signed)
71 yo divorced femal new to me. Previously seen by Dr Deborah Chalk. Reviewed his office notes for the past 3 years. History of palpitations improved with beta blocker. CRF;s HTN and elevated lipids. No real primary care doctor. Goes to urgent care sporadically. Dr Deborah Chalk usually checks lab work. Needs lipid and liver today. On stable BP regiman for last 2 years except calcium blocker and ACE separated. Caffeine makes palpitatons worse and she tries to minimize. No known structural heart disease. Previous echo and ETT normal in last 5 years. No SSCP, syncope. Mild LE edema with previous vein procedures in both legs ? Varicosities  ROS: Denies fever, malais, weight loss, blurry vision, decreased visual acuity, cough, sputum, SOB, hemoptysis, pleuritic pain, palpitaitons, heartburn, abdominal pain, melena, lower extremity edema, claudication, or rash.  All other systems reviewed and negative  General: Affect appropriate Healthy:  appears stated age HEENT: normal Neck supple with no adenopathy JVP normal no bruits no thyromegaly Lungs clear with no wheezing and good diaphragmatic motion Heart:  S1/S2 no murmur,rub, gallop or click PMI normal Abdomen: benighn, BS positve, no tenderness, no AAA no bruit.  No HSM or HJR Distal pulses intact with no bruits No edema Neuro non-focal Skin warm and dry No muscular weakness   Current Outpatient Prescriptions  Medication Sig Dispense Refill  . amLODipine (NORVASC) 5 MG tablet Take 1 tablet (5 mg total) by mouth daily.  90 tablet  1  . benazepril (LOTENSIN) 20 MG tablet Take 1 tablet (20 mg total) by mouth daily.  90 tablet  1  . metoprolol (TOPROL-XL) 50 MG 24 hr tablet Take 1 tablet (50 mg total) by mouth daily.  90 tablet  1  . oxyCODONE-acetaminophen (PERCOCET) 5-325 MG per tablet Take 1 tablet by mouth every 6 (six) hours as needed for pain. May take 1-2 tabs every 6 hours prn pain  30 tablet  0  . rosuvastatin (CRESTOR) 10 MG tablet Take 1 tablet (10  mg total) by mouth daily.  90 tablet  1    Allergies  Avelox  Electrocardiogram:  Assessment and Plan

## 2011-01-21 NOTE — Assessment & Plan Note (Signed)
Well controlled.  Continue current medications and low sodium Dash type diet.    

## 2011-01-21 NOTE — Progress Notes (Signed)
Addended by: Royanne Foots on: 01/21/2011 03:07 PM   Modules accepted: Orders

## 2011-01-21 NOTE — Assessment & Plan Note (Signed)
Resolved   Observe

## 2011-01-21 NOTE — Assessment & Plan Note (Signed)
Check BMET and TSH today

## 2011-01-21 NOTE — Assessment & Plan Note (Signed)
No ECG done today  F/U in 6 months

## 2011-01-22 LAB — LIPID PANEL
Cholesterol: 167 mg/dL (ref 0–200)
HDL: 58.9 mg/dL (ref >39.00)
LDL Cholesterol: 87 mg/dL (ref 0–99)
Triglycerides: 104 mg/dL (ref 0.0–149.0)
VLDL: 20.8 mg/dL (ref 0.0–40.0)

## 2011-01-22 LAB — BASIC METABOLIC PANEL WITH GFR
CO2: 27 mEq/L (ref 19–32)
Chloride: 105 mEq/L (ref 96–112)
Creat: 0.9 mg/dL (ref 0.50–1.10)
GFR, Est Non African American: 66 mL/min
Potassium: 4.8 mEq/L (ref 3.5–5.3)
Sodium: 140 mEq/L (ref 135–145)

## 2011-01-22 LAB — HEPATIC FUNCTION PANEL
ALT: 27 U/L (ref 0–35)
Albumin: 4.4 g/dL (ref 3.5–5.2)
Total Protein: 7.6 g/dL (ref 6.0–8.3)

## 2011-02-01 DIAGNOSIS — H01009 Unspecified blepharitis unspecified eye, unspecified eyelid: Secondary | ICD-10-CM | POA: Diagnosis not present

## 2011-02-01 DIAGNOSIS — L719 Rosacea, unspecified: Secondary | ICD-10-CM | POA: Diagnosis not present

## 2011-02-01 DIAGNOSIS — H1045 Other chronic allergic conjunctivitis: Secondary | ICD-10-CM | POA: Diagnosis not present

## 2011-02-08 DIAGNOSIS — H01009 Unspecified blepharitis unspecified eye, unspecified eyelid: Secondary | ICD-10-CM | POA: Diagnosis not present

## 2011-02-08 DIAGNOSIS — H1045 Other chronic allergic conjunctivitis: Secondary | ICD-10-CM | POA: Diagnosis not present

## 2011-02-08 DIAGNOSIS — H04129 Dry eye syndrome of unspecified lacrimal gland: Secondary | ICD-10-CM | POA: Diagnosis not present

## 2011-02-08 DIAGNOSIS — H16149 Punctate keratitis, unspecified eye: Secondary | ICD-10-CM | POA: Diagnosis not present

## 2011-02-21 ENCOUNTER — Emergency Department (HOSPITAL_COMMUNITY)
Admission: EM | Admit: 2011-02-21 | Discharge: 2011-02-21 | Disposition: A | Discharge status: Home / Self Care | Payer: Medicare Other | Attending: Emergency Medicine | Admitting: Emergency Medicine

## 2011-02-21 ENCOUNTER — Encounter (HOSPITAL_COMMUNITY): Payer: Self-pay | Admitting: *Deleted

## 2011-02-21 DIAGNOSIS — M549 Dorsalgia, unspecified: Secondary | ICD-10-CM

## 2011-02-21 DIAGNOSIS — N390 Urinary tract infection, site not specified: Secondary | ICD-10-CM | POA: Insufficient documentation

## 2011-02-21 DIAGNOSIS — I10 Essential (primary) hypertension: Secondary | ICD-10-CM | POA: Insufficient documentation

## 2011-02-21 DIAGNOSIS — M545 Low back pain: Secondary | ICD-10-CM | POA: Diagnosis not present

## 2011-02-21 LAB — URINALYSIS, ROUTINE W REFLEX MICROSCOPIC
Ketones, ur: NEGATIVE mg/dL
Protein, ur: NEGATIVE mg/dL
Urobilinogen, UA: 0.2 mg/dL (ref 0.0–1.0)

## 2011-02-21 LAB — URINE MICROSCOPIC-ADD ON

## 2011-02-21 MED ORDER — DIAZEPAM 5 MG PO TABS: 5.0000 mg | ORAL_TABLET | Freq: Every day | ORAL | Status: AC

## 2011-02-21 MED ORDER — OXYCODONE-ACETAMINOPHEN 5-325 MG PO TABS: 1.0000 | ORAL_TABLET | Freq: Three times a day (TID) | ORAL | Status: DC | PRN

## 2011-02-21 MED ORDER — CEPHALEXIN 500 MG PO CAPS: 500.0000 mg | ORAL_CAPSULE | Freq: Two times a day (BID) | ORAL | Status: AC

## 2011-02-21 MED ORDER — CEPHALEXIN 500 MG PO CAPS
500.0000 mg | ORAL_CAPSULE | Freq: Once | ORAL | Status: AC
  Administered 2011-02-21: 500 mg via ORAL
  Filled 2011-02-21: qty 1

## 2011-02-21 NOTE — ED Provider Notes (Signed)
History     CSN: [id number]  Arrival date & time 02/21/11  4098   First MD Initiated Contact with Patient 02/21/11 [phone]      Chief Complaint  Patient presents with  . Back Pain   HPI The patient describes pain that began insidiously yesterday, ~12hr pta.  She recalls only "line dancing" as new or unusual activity that preceded the onset of Sx.  Since onset the pain has been constant, "crampy" R-sided in the lower back.  Minimal relief w activity, no attempts at PO control (she is out of Percocet).  She also c/o f/c (which is new).  She denies abd pain, emesis/diarrhea, confusion, le pain/weakness. Past Medical History  Diagnosis Date  . Bleeding ulcer     22 years ago  . Hypertension   . Dyslipidemia   . Diverticulitis   . Arthritis   . Blood in stool   . Depression   . Headache   . Cardiac arrhythmia due to congenital heart disease   . Hyperlipidemia   . UTI (urinary tract infection)   . Acute ischemic colitis 1/12  . Migraine     Past Surgical History  Procedure Date  . Bleeding ulcers     22 years ago  . Vein procedures     right and left lower extremity vein procedures    Family History  Problem Relation Age of Onset  . Heart disease Mother     age 59  . Diabetes Mother   . Hypertension Mother   . Hypertension Sister     History  Substance Use Topics  . Smoking status: Former Smoker    Types: Cigarettes    Quit date: 01/19/1983  . Smokeless tobacco: Never Used  . Alcohol Use: Not on file    OB History    Grav Para Term Preterm Abortions TAB SAB Ect Mult Living                  Review of Systems  Constitutional:       HPI  HENT:       HPI otherwise negative  Eyes: Negative.   Respiratory:       HPI, otherwise negative  Cardiovascular:       HPI, otherwise nmegative  Gastrointestinal: Negative for vomiting.  Genitourinary:       HPI, otherwise negative  Musculoskeletal:       HPI, otherwise negative  Skin: Negative.   Neurological:  Negative for syncope.    Allergies  Avelox  Home Medications   Current Outpatient Rx  Name Route Sig Dispense Refill  . AMLODIPINE BESYLATE 5 MG PO TABS Oral Take 1 tablet (5 mg total) by mouth daily. 90 tablet 1  . BENAZEPRIL HCL 20 MG PO TABS Oral Take 1 tablet (20 mg total) by mouth daily. 90 tablet 1  . METOPROLOL SUCCINATE ER 50 MG PO TB24 Oral Take 1 tablet (50 mg total) by mouth daily. 90 tablet 1  . OXYCODONE-ACETAMINOPHEN 5-325 MG PO TABS Oral Take 1 tablet by mouth every 6 (six) hours as needed for pain. May take 1-2 tabs every 6 hours prn pain 30 tablet 0  . ROSUVASTATIN CALCIUM 10 MG PO TABS Oral Take 1 tablet (10 mg total) by mouth daily. 90 tablet 1    BP 183/60  Pulse 67  Resp 18  SpO2 100%  Physical Exam  Nursing note and vitals reviewed. Constitutional: She is oriented to person, place, and time. She appears well-developed and well-nourished.  No distress.  HENT:  Head: Normocephalic and atraumatic.  Eyes: Conjunctivae and EOM are normal.  Cardiovascular: Normal rate and regular rhythm.   Pulmonary/Chest: Effort normal and breath sounds normal. No stridor. No respiratory distress.  Abdominal: Normal appearance. She exhibits no distension. There is no tenderness. There is no rebound and no guarding.  Musculoskeletal: She exhibits no edema.       Right hip: She exhibits normal strength.       Left hip: She exhibits normal strength.       Back:  Neurological: She is alert and oriented to person, place, and time. No cranial nerve deficit.  Skin: Skin is warm and dry.  Psychiatric: She has a normal mood and affect.    ED Course  Procedures (including critical care time)   Labs Reviewed  URINALYSIS, ROUTINE W REFLEX MICROSCOPIC   No results found.   No diagnosis found.    MDM  This elderly female now presents with one day of back pain.  On exam the patient is in no distress with no neurologic dysfunction, mild tenderness to palpation about her right  back.  Given the patient's description of fevers and chills, there was some concern for urinary tract infection.  Patient's urinalysis is suggestive of this, though not definitive.  Given her description she will provided antibiotics as well as analgesics.  She was discharged in stable condition to follow up with her primary care physician      Gerhard Munch, MD 02/21/11 [phone]

## 2011-02-21 NOTE — ED Notes (Addendum)
Pt c/o low back pain starting late yesterday. Pt states she last took ibuprofen 600 mg last night. Pt reports not having any other pain medication. Pt normally gets percocet for her back pain. Pt drove self to the ED today, has no one with her.

## 2011-02-21 NOTE — ED Notes (Signed)
MD at bedside. 

## 2011-02-21 NOTE — ED Notes (Signed)
Pt is experiencing lower back muscular spasms.  Pt has hx of same.  Pain is rated @ 9/10 and intermittent.

## 2011-02-21 NOTE — ED Notes (Signed)
Vital signs stable. 

## 2011-02-21 NOTE — ED Notes (Signed)
Patient is resting comfortably. 

## 2011-02-22 ENCOUNTER — Other Ambulatory Visit: Payer: Self-pay | Admitting: Family Medicine

## 2011-02-22 NOTE — Telephone Encounter (Signed)
Pt need new rx generic percocet. Pt was given 12 pills on 02-21-2011 by ER md.

## 2011-02-22 NOTE — Telephone Encounter (Signed)
If only received yesterday, should not need yet.  Would try to only take prn to avoid dependency.

## 2011-02-23 NOTE — Telephone Encounter (Signed)
Pt informed on VM

## 2011-03-11 DIAGNOSIS — L719 Rosacea, unspecified: Secondary | ICD-10-CM | POA: Diagnosis not present

## 2011-03-11 DIAGNOSIS — H16149 Punctate keratitis, unspecified eye: Secondary | ICD-10-CM | POA: Diagnosis not present

## 2011-03-22 ENCOUNTER — Other Ambulatory Visit: Payer: Self-pay | Admitting: Cardiovascular Disease

## 2011-03-22 MED ORDER — METOPROLOL SUCCINATE ER 50 MG PO TB24: 50.0000 mg | ORAL_TABLET | Freq: Every day | ORAL | Status: DC

## 2011-04-13 ENCOUNTER — Encounter: Payer: Self-pay | Admitting: *Deleted

## 2011-05-20 DIAGNOSIS — L719 Rosacea, unspecified: Secondary | ICD-10-CM | POA: Diagnosis not present

## 2011-05-20 DIAGNOSIS — H04129 Dry eye syndrome of unspecified lacrimal gland: Secondary | ICD-10-CM | POA: Diagnosis not present

## 2011-05-20 DIAGNOSIS — H179 Unspecified corneal scar and opacity: Secondary | ICD-10-CM | POA: Diagnosis not present

## 2011-05-20 DIAGNOSIS — H01009 Unspecified blepharitis unspecified eye, unspecified eyelid: Secondary | ICD-10-CM | POA: Diagnosis not present

## 2011-07-16 DIAGNOSIS — I10 Essential (primary) hypertension: Secondary | ICD-10-CM | POA: Diagnosis not present

## 2011-07-16 DIAGNOSIS — N390 Urinary tract infection, site not specified: Secondary | ICD-10-CM | POA: Diagnosis not present

## 2011-07-16 DIAGNOSIS — R300 Dysuria: Secondary | ICD-10-CM | POA: Diagnosis not present

## 2011-07-16 DIAGNOSIS — E785 Hyperlipidemia, unspecified: Secondary | ICD-10-CM | POA: Diagnosis not present

## 2011-07-21 ENCOUNTER — Encounter: Payer: Self-pay | Admitting: Cardiovascular Disease

## 2011-07-21 ENCOUNTER — Ambulatory Visit (INDEPENDENT_AMBULATORY_CARE_PROVIDER_SITE_OTHER): Payer: Medicare Other | Admitting: Cardiovascular Disease

## 2011-07-21 VITALS — BP 140/69 | HR 88 | Wt 161.0 lb

## 2011-07-21 DIAGNOSIS — Z79899 Other long term (current) drug therapy: Secondary | ICD-10-CM | POA: Diagnosis not present

## 2011-07-21 DIAGNOSIS — E785 Hyperlipidemia, unspecified: Secondary | ICD-10-CM | POA: Diagnosis not present

## 2011-07-21 DIAGNOSIS — I10 Essential (primary) hypertension: Secondary | ICD-10-CM

## 2011-07-21 DIAGNOSIS — R609 Edema, unspecified: Secondary | ICD-10-CM | POA: Insufficient documentation

## 2011-07-21 DIAGNOSIS — R002 Palpitations: Secondary | ICD-10-CM | POA: Diagnosis not present

## 2011-07-21 LAB — HEPATIC FUNCTION PANEL
ALT: 26 U/L (ref 0–35)
AST: 25 U/L (ref 0–37)
Total Protein: 8 g/dL (ref 6.0–8.3)

## 2011-07-21 LAB — LIPID PANEL
Cholesterol: 197 mg/dL (ref 0–200)
LDL Cholesterol: 110 mg/dL — ABNORMAL HIGH (ref 0–99)
Triglycerides: 134 mg/dL (ref 0.0–149.0)

## 2011-07-21 MED ORDER — BENAZEPRIL HCL 20 MG PO TABS: 20.0000 mg | ORAL_TABLET | Freq: Every day | ORAL | Status: DC

## 2011-07-21 MED ORDER — HYDROCHLOROTHIAZIDE 25 MG PO TABS: 25.0000 mg | ORAL_TABLET | ORAL | Status: DC

## 2011-07-21 MED ORDER — AMLODIPINE BESYLATE 5 MG PO TABS: 5.0000 mg | ORAL_TABLET | Freq: Every day | ORAL | Status: DC

## 2011-07-21 MED ORDER — METOPROLOL SUCCINATE ER 50 MG PO TB24: 50.0000 mg | ORAL_TABLET | Freq: Every day | ORAL | Status: DC

## 2011-07-21 NOTE — Assessment & Plan Note (Signed)
Dependant with minor varicosities  Low sodium diet PRN HCTZ called in

## 2011-07-21 NOTE — Assessment & Plan Note (Signed)
Well controlled.  Continue current medications and low sodium Dash type diet.    

## 2011-07-21 NOTE — Assessment & Plan Note (Signed)
Recheck labs today May need more statin or add zetia

## 2011-07-21 NOTE — Assessment & Plan Note (Signed)
Resolved continue beta blocker 

## 2011-07-21 NOTE — Patient Instructions (Signed)
Your physician wants you to follow-up in:   6 MONTHS WITH DR Haywood Filler will receive a reminder letter in the mail two months in advance. If you don't receive a letter, please call our office to schedule the follow-up appointment. Your physician has recommended you make the following change in your medication: YOU MAY TAKE HYDROCHLOROTHIAZIDE 25 MG AS NEEDED FOR SWELLING Your physician recommends that you return for lab work in: TODAY  LIPID LIVER  DX 272.4  V58.69

## 2011-07-21 NOTE — Progress Notes (Signed)
Patient ID: Madison Hernandez, female   DOB: 22-Sep-1940, 71 y.o.   MRN: [id number] 71 yo divorced femal new to me. Previously seen by Dr Deborah Chalk. Reviewed his office notes for the past 3 years. History of palpitations improved with beta blocker. CRF;s HTN and elevated lipids. No real primary care doctor. Goes to urgent care sporadically. Dr Deborah Chalk usually checks lab work.   On stable BP regiman for last 2 years except calcium blocker and ACE separated. Caffeine makes palpitatons worse and she tries to minimize. No known structural heart disease. Previous echo and ETT normal in last 5 years. No SSCP, syncope. Mild LE edema with previous vein procedures in both legs ? Varicosities  LDL 228 in February and crestor restarted  ROS: Denies fever, malais, weight loss, blurry vision, decreased visual acuity, cough, sputum, SOB, hemoptysis, pleuritic pain, palpitaitons, heartburn, abdominal pain, melena, lower extremity edema, claudication, or rash.  All other systems reviewed and negative  General: Affect appropriate Healthy:  appears stated age HEENT: normal Neck supple with no adenopathy JVP normal no bruits no thyromegaly Lungs clear with no wheezing and good diaphragmatic motion Heart:  S1/S2 no murmur, no rub, gallop or click PMI normal Abdomen: benighn, BS positve, no tenderness, no AAA no bruit.  No HSM or HJR Distal pulses intact with no bruits No edema Neuro non-focal Skin warm and dry No muscular weakness   Current Outpatient Prescriptions  Medication Sig Dispense Refill  . amLODipine (NORVASC) 5 MG tablet Take 5 mg by mouth daily.      . benazepril (LOTENSIN) 20 MG tablet Take 20 mg by mouth daily.      . metoprolol succinate (TOPROL-XL) 50 MG 24 hr tablet Take 1 tablet (50 mg total) by mouth daily.  90 tablet  1  . rosuvastatin (CRESTOR) 10 MG tablet Take 10 mg by mouth daily.        Allergies  Avelox  Electrocardiogram:  NSR PAC PVC QT 486 rate 88  Assessment and Plan

## 2011-07-26 ENCOUNTER — Other Ambulatory Visit: Payer: Self-pay | Admitting: *Deleted

## 2011-07-26 ENCOUNTER — Telehealth: Payer: Self-pay | Admitting: Cardiovascular Disease

## 2011-07-26 MED ORDER — ROSUVASTATIN CALCIUM 10 MG PO TABS: 10.0000 mg | ORAL_TABLET | Freq: Every day | ORAL | Status: DC

## 2011-07-26 NOTE — Telephone Encounter (Signed)
New Problem:    Patient called in wanting to know the results of her blood work.  Please call back.

## 2011-07-26 NOTE — Telephone Encounter (Signed)
PT AWARE OF LAB RESULTS./CY 

## 2011-08-12 ENCOUNTER — Encounter (HOSPITAL_COMMUNITY): Payer: Self-pay | Admitting: *Deleted

## 2011-08-12 ENCOUNTER — Emergency Department (HOSPITAL_COMMUNITY)
Admission: EM | Admit: 2011-08-12 | Discharge: 2011-08-12 | Disposition: A | Discharge status: Home / Self Care | Payer: Medicare Other | Attending: Emergency Medicine | Admitting: Emergency Medicine

## 2011-08-12 DIAGNOSIS — M129 Arthropathy, unspecified: Secondary | ICD-10-CM | POA: Insufficient documentation

## 2011-08-12 DIAGNOSIS — H6120 Impacted cerumen, unspecified ear: Secondary | ICD-10-CM

## 2011-08-12 DIAGNOSIS — E785 Hyperlipidemia, unspecified: Secondary | ICD-10-CM | POA: Diagnosis not present

## 2011-08-12 DIAGNOSIS — Z87891 Personal history of nicotine dependence: Secondary | ICD-10-CM | POA: Insufficient documentation

## 2011-08-12 DIAGNOSIS — I10 Essential (primary) hypertension: Secondary | ICD-10-CM | POA: Insufficient documentation

## 2011-08-12 DIAGNOSIS — H9190 Unspecified hearing loss, unspecified ear: Secondary | ICD-10-CM | POA: Diagnosis not present

## 2011-08-12 NOTE — ED Notes (Signed)
Pt reports she was cleaning left ear and now is having difficulty hearing out or ear. Pt reports she is unable to hear out of right ear since childhood.  Pt denies pain.  Pt reports history of puncturing left TM with Q-tip prior to this episode and having it patched.

## 2011-08-12 NOTE — ED Provider Notes (Signed)
History     CSN: [id number]  Arrival date & time 08/12/11  1107   First MD Initiated Contact with Patient 08/12/11 1123      Chief Complaint  Patient presents with  . Ear Injury    (Consider location/radiation/quality/duration/timing/severity/associated sxs/prior treatment) HPI Comments: Madison Hernandez is a 71 year old Caucasian female who presents today with a chief complaint of sudden hearing loss in her left ear. 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 She also reports a past rupture of her left TM due to a q-tip approximately 3 years ago that was surgically repaired. Today Madison Hernandez was using a q-tip to clean wax from her left external ear when she experienced a sudden loss of hearing. She denies trauma to her ear, pain, discharge, tinnitus, or bleeding from the ear. She denies recent fever, chills, nausea, vomiting, otalgia, congestion, rhinorrhea, or sore throat.   Patient is a 71 y.o. female presenting with plugged ear sensation. The history is provided by the patient.  Ear Fullness This is a new problem. The current episode started today. The problem occurs constantly. The problem has been unchanged. Pertinent negatives include no congestion, fever, headaches, nausea, sore throat or vomiting. Nothing aggravates the symptoms. She has tried nothing for the symptoms.    Past Medical History  Diagnosis Date  . Bleeding ulcer     22 years ago  . Hypertension   . Dyslipidemia   . Diverticulitis   . Arthritis   . Blood in stool   . Depression   . Headache   . Cardiac arrhythmia due to congenital heart disease   . Hyperlipidemia   . UTI (urinary tract infection)   . Acute ischemic colitis 1/12  . Migraine     Past Surgical History  Procedure Date  . Bleeding ulcers     22 years ago  . Vein procedures     right and left lower extremity vein procedures    Family History  Problem Relation Age of  Onset  . Heart disease Mother     age 38  . Diabetes Mother   . Hypertension Mother   . Hypertension Sister     History  Substance Use Topics  . Smoking status: Former Smoker    Types: Cigarettes    Quit date: 01/19/1983  . Smokeless tobacco: Never Used  . Alcohol Use: Not on file    OB History    Grav Para Term Preterm Abortions TAB SAB Ect Mult Living                  Review of Systems  Constitutional: Negative for fever.  HENT: Positive for hearing loss. Negative for ear pain, congestion, sore throat, rhinorrhea and ear discharge.   Gastrointestinal: Negative for nausea and vomiting.  Neurological: Negative for headaches.    Allergies  Avelox  Home Medications   Current Outpatient Rx  Name Route Sig Dispense Refill  . AMLODIPINE BESYLATE 5 MG PO TABS Oral Take 5 mg by mouth daily.    Marland Kitchen BENAZEPRIL HCL 20 MG PO TABS Oral Take 20 mg by mouth daily.    Marland Kitchen HYDROCHLOROTHIAZIDE 25 MG PO TABS Oral Take 25 mg by mouth daily.    Marland Kitchen ROSUVASTATIN CALCIUM 10 MG PO TABS Oral Take 10 mg by mouth daily.      BP 159/62  Pulse 92  Temp 97.8 F (36.6 C) (Oral)  Resp 18  SpO2 98%  Physical Exam  Nursing note and vitals reviewed. Constitutional: She appears well-developed and well-nourished.  HENT:  Head: Normocephalic and atraumatic.  Right Ear: No drainage or tenderness. No mastoid tenderness. Tympanic membrane is perforated.  Left Ear: No drainage or tenderness. No mastoid tenderness. Tympanic membrane is scarred. Tympanic membrane is not injected and not perforated.  Eyes: Conjunctivae are normal.       Cerumen impaction in L ear canal. R TM appears chronically damaged.   Neck: Normal range of motion. Neck supple.  Pulmonary/Chest: No respiratory distress.  Neurological: She is alert.  Skin: Skin is warm and dry.  Psychiatric: She has a normal mood and affect.    ED Course  Procedures (including critical care time)  Labs Reviewed - No data to display No results  found.   1. Hearing loss secondary to cerumen impaction     12:13 PM Patient seen and examined. Cerumen removed from ear canal with cuvette without difficulty.   Vital signs reviewed and are as follows: Filed Vitals:   08/12/11 1112  BP: 159/62  Pulse: 92  Temp: 97.8 F (36.6 C)  Resp: 18   ENT referral given for hearing difficulty.    MDM  Hearing loss 2/2 cerumen impaction -- impaction removed and hearing improved to baseline.   Chronic hearing loss -- ENT referral to re-establish care.         Renne Crigler, [place] 08/12/11 1217

## 2011-08-13 NOTE — ED Provider Notes (Signed)
Medical screening examination/treatment/procedure(s) were conducted as a shared visit with non-physician practitioner(s) and myself.  I personally evaluated the patient during the encounter.  71yF with decreased hearing in L ear 2/2 cerumen impaction. Chronic hearing problems R ear from prior TM perf. Cerumen removed with relief of symptoms. L TM and ext aud canal normal after procedure.  Raeford Razor, MD 08/13/11 [phone]

## 2011-08-25 ENCOUNTER — Ambulatory Visit (INDEPENDENT_AMBULATORY_CARE_PROVIDER_SITE_OTHER): Payer: Medicare Other | Admitting: Family Medicine

## 2011-08-25 ENCOUNTER — Encounter: Payer: Self-pay | Admitting: Family Medicine

## 2011-08-25 VITALS — BP 160/70 | Temp 98.3°F | Wt 168.0 lb

## 2011-08-25 DIAGNOSIS — I10 Essential (primary) hypertension: Secondary | ICD-10-CM | POA: Diagnosis not present

## 2011-08-25 DIAGNOSIS — M546 Pain in thoracic spine: Secondary | ICD-10-CM | POA: Diagnosis not present

## 2011-08-25 MED ORDER — HYDROCODONE-ACETAMINOPHEN 5-325 MG PO TABS: 1.0000 | ORAL_TABLET | Freq: Four times a day (QID) | ORAL | Status: AC | PRN

## 2011-08-25 MED ORDER — CYCLOBENZAPRINE HCL 5 MG PO TABS: 5.0000 mg | ORAL_TABLET | Freq: Three times a day (TID) | ORAL | Status: AC | PRN

## 2011-08-25 NOTE — Patient Instructions (Addendum)
Take HCTZ daily until follow up and continue other blood pressure medications. Watch sodium intake.

## 2011-08-25 NOTE — Progress Notes (Signed)
  Subjective:    Patient ID: Madison Hernandez, female    DOB: [birth year], 71 y.o.   MRN: [id number]  HPI  Patient seen with recurrent left thoracic back pain. Had similar episodes several times over the years. Never had any rash. Burning quality. Just left of midline. No dyspnea. No pleuritic pain. No cough. Denies chest pain. Tried heat and Aleve without relief. Has previously taken muscle relaxers and opioids with relief. Denies injury.  Hypertension treated with amlodipine and enalapril. Not monitoring blood pressure on. Recent blood pressure 160 systolic in cardiology office. She takes HCTZ but only sporadically-for edema. Using support hose. No headaches. Has had some gradual increase in edema over the past year. Occasionally significant sodium intake  Past Medical History  Diagnosis Date  . Bleeding ulcer     22 years ago  . Hypertension   . Dyslipidemia   . Diverticulitis   . Arthritis   . Blood in stool   . Depression   . Headache   . Cardiac arrhythmia due to congenital heart disease   . Hyperlipidemia   . UTI (urinary tract infection)   . Acute ischemic colitis 1/12  . Migraine    Past Surgical History  Procedure Date  . Bleeding ulcers     22 years ago  . Vein procedures     right and left lower extremity vein procedures    reports that she quit smoking about 28 years ago. Her smoking use included Cigarettes. She has never used smokeless tobacco. Her alcohol and drug histories not on file. family history includes Diabetes in her mother; Heart disease in her mother; and Hypertension in her mother and sister. Allergies  Allergen Reactions  . Avelox (Moxifloxacin Hcl In Nacl) Shortness Of Breath      Review of Systems  Constitutional: Negative for fatigue.  Eyes: Negative for visual disturbance.  Respiratory: Negative for cough, chest tightness, shortness of breath and wheezing.   Cardiovascular: Positive for leg swelling. Negative for chest pain and palpitations.    Genitourinary: Negative for dysuria.  Musculoskeletal: Positive for back pain.  Neurological: Negative for dizziness, seizures, syncope, weakness, light-headedness and headaches.       Objective:   Physical Exam  Constitutional: She appears well-developed and well-nourished.  Neck: Neck supple. No thyromegaly present.  Cardiovascular: Normal rate and regular rhythm.   Pulmonary/Chest: Effort normal and breath sounds normal. No respiratory distress. She has no wheezes. She has no rales.  Musculoskeletal:       Patient has support hose on. Mild bilateral peripheral edema  Mild tenderness left parathoracic musculature. No ecchymosis. No visible rash. No erythema. No spinal tenderness          Assessment & Plan:  #1 left thoracic back pain. Suspect muscular. Low-dose short-term use Flexeril 5 mg every 8 hours when necessary. Limited hydrocodone 5/325 mg one every 6 hours as a for severe pain. Question neuropathic. Consider low-dose gabapentin if pain persists. #2 hypertension. Poorly controlled. Add HCTZ 25 mg daily and reassess blood pressure 3 weeks. Check basic metabolic panel then. Consider home blood pressure cuff. Low sodium diet

## 2011-09-15 ENCOUNTER — Ambulatory Visit (INDEPENDENT_AMBULATORY_CARE_PROVIDER_SITE_OTHER): Payer: Medicare Other | Admitting: Family Medicine

## 2011-09-15 ENCOUNTER — Encounter: Payer: Self-pay | Admitting: Family Medicine

## 2011-09-15 VITALS — BP 138/60 | Temp 97.6°F | Wt 165.0 lb

## 2011-09-15 DIAGNOSIS — M25562 Pain in left knee: Secondary | ICD-10-CM

## 2011-09-15 DIAGNOSIS — I10 Essential (primary) hypertension: Secondary | ICD-10-CM | POA: Diagnosis not present

## 2011-09-15 DIAGNOSIS — M25569 Pain in unspecified knee: Secondary | ICD-10-CM | POA: Diagnosis not present

## 2011-09-15 MED ORDER — HYDROCHLOROTHIAZIDE 25 MG PO TABS: 25.0000 mg | ORAL_TABLET | Freq: Every day | ORAL | Status: DC

## 2011-09-15 MED ORDER — DICLOFENAC SODIUM 75 MG PO TBEC: 75.0000 mg | DELAYED_RELEASE_TABLET | Freq: Two times a day (BID) | ORAL | Status: DC

## 2011-09-15 NOTE — Progress Notes (Signed)
  Subjective:    Patient ID: Madison Hernandez, female    DOB: [birth year], 71 y.o.   MRN: [id number]  HPI  Patient seen for the following issues  Hypertension. Last visit systolic around 160. We added HCTZ to her amlodipine and benazepril. Blood pressure improved since then. Overall feels more energy and feels well overall. No headaches. No dizziness.  New complaint of intermittent left knee pain. Off and on for years. No injury. Possibly some mild swelling during flareups. She has not noted warmth or erythema. No history of gout or pseudogout. Pain is very intermittent. Worse with ambulation. Requesting anti-inflammatory for flareups. No history of peptic ulcer disease.  Past Medical History  Diagnosis Date  . Bleeding ulcer     22 years ago  . Hypertension   . Dyslipidemia   . Diverticulitis   . Arthritis   . Blood in stool   . Depression   . Headache   . Cardiac arrhythmia due to congenital heart disease   . Hyperlipidemia   . UTI (urinary tract infection)   . Acute ischemic colitis 1/12  . Migraine    Past Surgical History  Procedure Date  . Bleeding ulcers     22 years ago  . Vein procedures     right and left lower extremity vein procedures    reports that she quit smoking about 28 years ago. Her smoking use included Cigarettes. She has never used smokeless tobacco. Her alcohol and drug histories not on file. family history includes Diabetes in her mother; Heart disease in her mother; and Hypertension in her mother and sister. Allergies  Allergen Reactions  . Avelox (Moxifloxacin Hcl In Nacl) Shortness Of Breath      Review of Systems  Constitutional: Negative for fatigue.  Eyes: Negative for visual disturbance.  Respiratory: Negative for cough, chest tightness, shortness of breath and wheezing.   Cardiovascular: Negative for chest pain, palpitations and leg swelling.  Neurological: Negative for dizziness, seizures, syncope, weakness, light-headedness and  headaches.       Objective:   Physical Exam  Constitutional: She appears well-developed and well-nourished.  HENT:       Right eardrum reveals chronic perforation with no signs of infection. She has distorted landmarks. Left eardrum is normal  Neck: Neck supple. No thyromegaly present.  Cardiovascular: Normal rate and regular rhythm.  Exam reveals no gallop.   Pulmonary/Chest: Effort normal and breath sounds normal. No respiratory distress. She has no wheezes. She has no rales.  Musculoskeletal:       Left knee reveals full range of motion. No localized tenderness. No warmth. No effusion.  Lymphadenopathy:    She has no cervical adenopathy.          Assessment & Plan:  #1 hypertension. Improved. Check basic metabolic panel with recent initiation of HCTZ. Refill HCTZ for one year #2 left knee pain. Intermittent. Question acute inflammatory etiology such as pseudogout. Prescription for diclofenac 75 mg twice a day when necessary for flareups only. Consider x-rays if she has any further recurrences or worsening

## 2011-10-08 DIAGNOSIS — Z23 Encounter for immunization: Secondary | ICD-10-CM | POA: Diagnosis not present

## 2011-10-21 ENCOUNTER — Ambulatory Visit (INDEPENDENT_AMBULATORY_CARE_PROVIDER_SITE_OTHER): Payer: Medicare Other | Admitting: Family Medicine

## 2011-10-21 ENCOUNTER — Encounter: Payer: Self-pay | Admitting: Family Medicine

## 2011-10-21 VITALS — BP 140/62 | Temp 98.0°F | Wt 167.0 lb

## 2011-10-21 DIAGNOSIS — M549 Dorsalgia, unspecified: Secondary | ICD-10-CM

## 2011-10-21 DIAGNOSIS — M546 Pain in thoracic spine: Secondary | ICD-10-CM

## 2011-10-21 DIAGNOSIS — J309 Allergic rhinitis, unspecified: Secondary | ICD-10-CM

## 2011-10-21 MED ORDER — DICLOFENAC SODIUM 75 MG PO TBEC: 75.0000 mg | DELAYED_RELEASE_TABLET | Freq: Two times a day (BID) | ORAL | Status: DC

## 2011-10-21 NOTE — Progress Notes (Signed)
  Subjective:    Patient ID: Madison Hernandez, female    DOB: 04-Jul-1940, 71 y.o.   MRN: [id number]  HPI  Patient seen with 2 day history of increased nasal congestion symptoms. She has had frequent symptoms of watery discharge and itching. She woke up this morning with some coughing and reported wheezing. No dyspnea. No fevers or chills. She's had some upper back pain left upper thoracic area past couple days. History of similar pain in the past which is very intermittent and she thinks related to a recent cough over the past day. She has pain with movement and touch. No rash. No pleuritic pain. No hemoptysis. Nonsmoker   Review of Systems  Constitutional: Negative for fever, chills, appetite change and unexpected weight change.  HENT: Positive for congestion and postnasal drip.   Respiratory: Positive for cough and wheezing. Negative for shortness of breath.   Neurological: Negative for dizziness.       Objective:   Physical Exam  Constitutional: She appears well-developed and well-nourished.  Neck: Neck supple.  Cardiovascular: Normal rate and regular rhythm.   Pulmonary/Chest: Effort normal and breath sounds normal. No respiratory distress. She has no wheezes. She has no rales.  Musculoskeletal:       Patient has some minimal tenderness palpating musculature left midthoracic area lateral to spine  Skin: No rash noted.          Assessment & Plan:  #1 probable allergic rhinitis. Try over-the-counter Allegra 180 mg once daily #2 muscular pain left upper back. Continue heat. Refill diclofenac 75 mg twice daily with food and use cautiously and short-term only. She has used this previously without difficulty

## 2011-10-21 NOTE — Patient Instructions (Addendum)
Try over the counter Allegra (fexofenadine) one daily

## 2011-10-22 ENCOUNTER — Telehealth: Payer: Self-pay | Admitting: Family Medicine

## 2011-10-22 NOTE — Telephone Encounter (Signed)
Caller: Brezlyn/Patient; Patient Name: Madison Hernandez; PCP: Evelena Peat [facility]); Best Callback Phone Number: [phone]; Reason for call: Cough/Congestion. Along with mid back pain.   Onset x4 days. She was seen in the office yesterday 10/21/11. Dr. Caryl Never told her that she had allergies. He gave her Allegra and Diflocenac for back pain. She reports last night taking Diclofenac and she felt worse.  Refused allegra and did not take it.  She found an old prescription for Entex at home and she states it has loosened the congestion.  She is coughing up mucus- yellow and green.  No fever, +sneezing intermittent.  She states she now feels better since starting the Entex. She does not have enough of the medication.  Emergent s/sx ruled out per Upper Respiratory infection Protocol with the exception of "Productive Cough with colored sputum". See Provider in 24 hours. Home care instructions reviewed, understanding reviewed. Encouraged to call back for questions, changes or concerns. Marland Kitchen PATIENT REQUEST MEDICATION ENTEX.  . Advised message would be forwarded to provider for review.

## 2011-10-22 NOTE — Telephone Encounter (Signed)
Pt reports she cannot take mucinex as it causes her heart to flutter.  She will purchase something OTC and monitor her BP.

## 2011-10-22 NOTE — Telephone Encounter (Signed)
Entex is an expectorant/decongestant.  Contains guaifenesin and phenylephrine (decongestant). I would be cautious about using decongestants with her hypertension hx.  Mucinex contains guaifenesin and I would suggest OTC Mucinex .Marland Kitchen  Decongestants such as sudafed are available without prescription but must be signed for -but again would only use for limited time and only if BP monitored closely.

## 2011-11-25 DIAGNOSIS — H524 Presbyopia: Secondary | ICD-10-CM | POA: Diagnosis not present

## 2011-11-25 DIAGNOSIS — H2510 Age-related nuclear cataract, unspecified eye: Secondary | ICD-10-CM | POA: Diagnosis not present

## 2011-11-25 DIAGNOSIS — H01009 Unspecified blepharitis unspecified eye, unspecified eyelid: Secondary | ICD-10-CM | POA: Diagnosis not present

## 2011-11-25 DIAGNOSIS — L719 Rosacea, unspecified: Secondary | ICD-10-CM | POA: Diagnosis not present

## 2011-12-31 ENCOUNTER — Ambulatory Visit (INDEPENDENT_AMBULATORY_CARE_PROVIDER_SITE_OTHER): Payer: Medicare Other | Admitting: Cardiovascular Disease

## 2011-12-31 ENCOUNTER — Encounter: Payer: Self-pay | Admitting: Cardiovascular Disease

## 2011-12-31 VITALS — BP 146/78 | HR 87 | Ht 67.0 in | Wt 165.8 lb

## 2011-12-31 DIAGNOSIS — R002 Palpitations: Secondary | ICD-10-CM | POA: Diagnosis not present

## 2011-12-31 DIAGNOSIS — R7309 Other abnormal glucose: Secondary | ICD-10-CM

## 2011-12-31 DIAGNOSIS — E785 Hyperlipidemia, unspecified: Secondary | ICD-10-CM | POA: Diagnosis not present

## 2011-12-31 DIAGNOSIS — R739 Hyperglycemia, unspecified: Secondary | ICD-10-CM

## 2011-12-31 DIAGNOSIS — Z131 Encounter for screening for diabetes mellitus: Secondary | ICD-10-CM | POA: Diagnosis not present

## 2011-12-31 DIAGNOSIS — I10 Essential (primary) hypertension: Secondary | ICD-10-CM

## 2011-12-31 LAB — HEMOGLOBIN A1C: Hgb A1c MFr Bld: 6.7 % — ABNORMAL HIGH (ref 4.6–6.5)

## 2011-12-31 LAB — COMPREHENSIVE METABOLIC PANEL
ALT: 91 U/L — ABNORMAL HIGH (ref 0–35)
AST: 53 U/L — ABNORMAL HIGH (ref 0–37)
Albumin: 4.9 g/dL (ref 3.5–5.2)
BUN: 29 mg/dL — ABNORMAL HIGH (ref 6–23)
CO2: 25 mEq/L (ref 19–32)
Calcium: 9.7 mg/dL (ref 8.4–10.5)
Chloride: 102 mEq/L (ref 96–112)
Creatinine, Ser: 1.3 mg/dL — ABNORMAL HIGH (ref 0.4–1.2)
GFR: 44.24 mL/min — ABNORMAL LOW (ref >60.00)
Potassium: 3.7 mEq/L (ref 3.5–5.1)

## 2011-12-31 MED ORDER — ROSUVASTATIN CALCIUM 10 MG PO TABS: 10.0000 mg | ORAL_TABLET | Freq: Every day | ORAL | Status: DC

## 2011-12-31 MED ORDER — HYDROCHLOROTHIAZIDE 25 MG PO TABS: 25.0000 mg | ORAL_TABLET | Freq: Every day | ORAL | Status: DC

## 2011-12-31 MED ORDER — BENAZEPRIL HCL 20 MG PO TABS: 20.0000 mg | ORAL_TABLET | Freq: Every day | ORAL | Status: DC

## 2011-12-31 MED ORDER — AMLODIPINE BESYLATE 5 MG PO TABS: 5.0000 mg | ORAL_TABLET | Freq: Every day | ORAL | Status: DC

## 2011-12-31 NOTE — Patient Instructions (Addendum)
LABS TODAY:  CBC w/ platelet and diff, CMET, TSH, HGB A1C.  Your physician wants you to follow-up in: 1 year with Dr. Eden Emms. You will receive a reminder letter in the mail two months in advance. If you don't receive a letter, please call our office to schedule the follow-up appointment.

## 2011-12-31 NOTE — Progress Notes (Signed)
Patient ID: Madison Hernandez, female   DOB: [birth year], 71 y.o.   MRN: [id number] 71 yo divorced female. Previously seen by Dr Deborah Chalk. Reviewed his office notes for the past 3 years. History of palpitations improved with beta blocker. CRF;s HTN and elevated lipids. No real primary care doctor. Goes to urgent care sporadically. Dr Deborah Chalk usually checks lab work. On stable BP regiman for last 2 years except calcium blocker and ACE separated. Caffeine makes palpitatons worse and she tries to minimize. No known structural heart disease. Previous echo and ETT normal in last 5 years. No SSCP, syncope. Mild LE edema with previous vein procedures in both legs ? Varicosities   LDL 228 in February and crestor restarted  F/U LDL 110 on 7/13 much improved  Wants refills for 90 days and repeat lab work.  No side effects from statin   ROS: Denies fever, malais, weight loss, blurry vision, decreased visual acuity, cough, sputum, SOB, hemoptysis, pleuritic pain, palpitaitons, heartburn, abdominal pain, melena, lower extremity edema, claudication, or rash.  All other systems reviewed and negative  General: Affect appropriate Healthy:  appears stated age HEENT: normal Neck supple with no adenopathy JVP normal no bruits no thyromegaly Lungs clear with no wheezing and good diaphragmatic motion Heart:  S1/S2 no murmur, no rub, gallop or click PMI normal Abdomen: benighn, BS positve, no tenderness, no AAA no bruit.  No HSM or HJR Distal pulses intact with no bruits No edema Neuro non-focal Skin warm and dry No muscular weakness   Current Outpatient Prescriptions  Medication Sig Dispense Refill  . amLODipine (NORVASC) 5 MG tablet Take 5 mg by mouth daily.      . benazepril (LOTENSIN) 20 MG tablet Take 20 mg by mouth daily.      . diclofenac (VOLTAREN) 75 MG EC tablet Take 1 tablet (75 mg total) by mouth 2 (two) times daily.  60 tablet  0  . hydrochlorothiazide (HYDRODIURIL) 25 MG tablet Take 1 tablet (25 mg  total) by mouth daily.  90 tablet  1  . rosuvastatin (CRESTOR) 10 MG tablet Take 10 mg by mouth daily.        Allergies  Avelox and Mucinex  Electrocardiogram:  Assessment and Plan

## 2011-12-31 NOTE — Assessment & Plan Note (Signed)
Resolved with no evidence of structural heart disease  

## 2011-12-31 NOTE — Assessment & Plan Note (Signed)
Well controlled.  Continue current medications and low sodium Dash type diet.    

## 2011-12-31 NOTE — Assessment & Plan Note (Signed)
Cholesterol is at goal.  Continue current dose of statin and diet Rx.  No myalgias or side effects.  F/U  LFT's in 6 months. Lab Results  Component Value Date   LDLCALC 110* 07/21/2011

## 2012-01-03 ENCOUNTER — Telehealth: Payer: Self-pay | Admitting: *Deleted

## 2012-01-03 ENCOUNTER — Other Ambulatory Visit: Payer: Self-pay | Admitting: *Deleted

## 2012-01-03 DIAGNOSIS — E785 Hyperlipidemia, unspecified: Secondary | ICD-10-CM

## 2012-01-03 DIAGNOSIS — R748 Abnormal levels of other serum enzymes: Secondary | ICD-10-CM

## 2012-01-03 DIAGNOSIS — Z79899 Other long term (current) drug therapy: Secondary | ICD-10-CM

## 2012-01-03 MED ORDER — AMLODIPINE BESYLATE 5 MG PO TABS: 10.0000 mg | ORAL_TABLET | Freq: Every day | ORAL | Status: DC

## 2012-01-03 MED ORDER — ROSUVASTATIN CALCIUM 10 MG PO TABS: 5.0000 mg | ORAL_TABLET | Freq: Every day | ORAL | Status: DC

## 2012-01-03 MED ORDER — HYDROCHLOROTHIAZIDE 25 MG PO TABS: 12.5000 mg | ORAL_TABLET | Freq: Every day | ORAL | Status: DC

## 2012-01-03 NOTE — Telephone Encounter (Signed)
SEE LAB RESULTS NOTE MED CHANGES  .Madison Hernandez

## 2012-01-07 ENCOUNTER — Telehealth: Payer: Self-pay | Admitting: Cardiovascular Disease

## 2012-01-07 NOTE — Telephone Encounter (Signed)
Spoke with pharmacy about the increase in meds

## 2012-01-07 NOTE — Telephone Encounter (Signed)
They need clarification on amilodopine meds ref# [id number]

## 2012-02-04 ENCOUNTER — Other Ambulatory Visit: Payer: Self-pay | Admitting: *Deleted

## 2012-02-04 MED ORDER — AMLODIPINE BESYLATE 10 MG PO TABS: 10.0000 mg | ORAL_TABLET | Freq: Every day | ORAL | Status: DC

## 2012-02-04 NOTE — Telephone Encounter (Signed)
Pt called for refill of amodipine 10mg  once daily by mouth, [facility] never received rx per patient. She also wanted the nurse to call her back about lab results for better understanding of why her medications have changed and her lab results. Will forward message to [place]. amlo 10mg  90, 3 refills sent to [facility] 02/04/12   Tymara Saur CMA

## 2012-02-08 NOTE — Telephone Encounter (Signed)
REVIEWED LAB VALUES ONCE AGAIN WITH PT AND INSTRUCTED  PT TO FOLLOW UP WITH PMD .Zack Seal

## 2012-02-14 ENCOUNTER — Encounter: Payer: Self-pay | Admitting: Family Medicine

## 2012-02-14 ENCOUNTER — Ambulatory Visit (INDEPENDENT_AMBULATORY_CARE_PROVIDER_SITE_OTHER): Payer: Medicare Other | Admitting: Family Medicine

## 2012-02-14 ENCOUNTER — Telehealth: Payer: Self-pay | Admitting: Family Medicine

## 2012-02-14 VITALS — BP 130/60 | Temp 97.5°F | Wt 153.0 lb

## 2012-02-14 DIAGNOSIS — R6889 Other general symptoms and signs: Secondary | ICD-10-CM | POA: Diagnosis not present

## 2012-02-14 DIAGNOSIS — R7989 Other specified abnormal findings of blood chemistry: Secondary | ICD-10-CM

## 2012-02-14 DIAGNOSIS — R7309 Other abnormal glucose: Secondary | ICD-10-CM | POA: Diagnosis not present

## 2012-02-14 DIAGNOSIS — I10 Essential (primary) hypertension: Secondary | ICD-10-CM | POA: Diagnosis not present

## 2012-02-14 DIAGNOSIS — E782 Mixed hyperlipidemia: Secondary | ICD-10-CM

## 2012-02-14 DIAGNOSIS — R5383 Other fatigue: Secondary | ICD-10-CM

## 2012-02-14 DIAGNOSIS — R739 Hyperglycemia, unspecified: Secondary | ICD-10-CM

## 2012-02-14 MED ORDER — DICLOFENAC SODIUM 75 MG PO TBEC: 75.0000 mg | DELAYED_RELEASE_TABLET | Freq: Two times a day (BID) | ORAL | Status: DC

## 2012-02-14 NOTE — Telephone Encounter (Signed)
Refill Percocet 5/325 mg one every 6 hours prn severe pain. Use sparingly #30 with 0 refill.

## 2012-02-14 NOTE — Progress Notes (Signed)
  Subjective:    Patient ID: Madison Hernandez, female    DOB: 1940/02/10, 72 y.o.   MRN: [id number]  HPI Medical followup. Patient recently saw cardiologist. She had several labs which were abnormal. Fasting blood sugar 131 with A1c 6.7%. TSH slightly over 6. Patient has never been diagnosed with hypothyroidism. She's had some chronic thirst but no urine frequency.  Patient has done a tremendous job over the past month with dietary change. She's lost over 10 pounds. She feels great. Good appetite. She's done this mostly through reduction of sugars and starches. She does not have any symptoms of overt hypothyroidism such as fatigue, constipation, hair loss, or cold intolerance. No significant fatigue  She takes Lotensin, HCTZ, and amlodipine. Recent increase in amlodipine and reduction hydrochlorothiazide. No orthostasis. No dizziness. No chest pains. Takes Crestor 5 mg daily for hyperlipidemia. Takes rare diclofenac. Minimally elevated liver transaminases last visit.  Past Medical History  Diagnosis Date  . Bleeding ulcer     22 years ago  . Hypertension   . Dyslipidemia   . Diverticulitis   . Arthritis   . Blood in stool   . Depression   . Headache   . Cardiac arrhythmia due to congenital heart disease   . Hyperlipidemia   . UTI (urinary tract infection)   . Acute ischemic colitis 1/12  . Migraine    Past Surgical History  Procedure Date  . Bleeding ulcers     22 years ago  . Vein procedures     right and left lower extremity vein procedures    reports that she quit smoking about 29 years ago. Her smoking use included Cigarettes. She has a 20 pack-year smoking history. She has never used smokeless tobacco. Her alcohol and drug histories not on file. family history includes Diabetes in her mother; Heart disease in her mother; and Hypertension in her mother and sister. Allergies  Allergen Reactions  . Avelox (Moxifloxacin Hcl In Nacl) Shortness Of Breath  . Mucinex (Guaifenesin Er)      "Makes my heart flutter"      Review of Systems  Constitutional: Negative for fatigue.  Eyes: Negative for visual disturbance.  Respiratory: Negative for cough, chest tightness, shortness of breath and wheezing.   Cardiovascular: Negative for chest pain, palpitations and leg swelling.  Neurological: Negative for dizziness, seizures, syncope, weakness, light-headedness and headaches.       Objective:   Physical Exam  Constitutional: She appears well-developed and well-nourished.  Neck: Neck supple. No thyromegaly present.  Cardiovascular: Normal rate and regular rhythm.  Exam reveals no gallop.   Pulmonary/Chest: Effort normal and breath sounds normal. No respiratory distress. She has no wheezes. She has no rales.  Musculoskeletal: She exhibits no edema.          Assessment & Plan:  #1 hyperglycemia. Recent A1c 6.7% with fasting blood sugar 131. Recheck fasting blood sugar today. Consider A1C in 3 months #2 abnormal TSH. Elevated at 6 and no symptoms of hypothyroidism. Question subclinical hypothyroidism. Recheck TSH in 3 months and if elevating at that point consider thyroid replacement #3 hypertension. Well controlled  #4 hyperlipidemia on low-dose Crestor  #5 minimally elevated liver transaminase. Recheck hepatic in 3 months and see if this is improved with her weight loss

## 2012-02-14 NOTE — Telephone Encounter (Signed)
Patient Information:  Caller Name: Cheyanne  Phone: [phone]  Patient: Madison Hernandez, Madison Hernandez  Gender: Female  DOB: 17-Jan-1941  Age: 72 Years  PCP: Evelena Peat [facility])  Office Follow Up:  Does the office need to follow up with this patient?: Yes  Instructions For The Office: Requesting Percocet Pain medication be called to [facility] at  [place]. Voltaren does not help.  RN Note:  She states that she has Chronic Back Pain and it flairs up once or twice a year requiring Pain medicine. She spoke to Dr. about it today and thought he was going to call in Percocet.  Symptoms  Reason For Call & Symptoms: Seen in office today and Prescribed medication for back pain that doesn't help-Voltaren. She is requesting Percocet be called to [facility] at [place].  Reviewed Health History In EMR: Yes  Reviewed Medications In EMR: Yes  Reviewed Allergies In EMR: Yes  Reviewed Surgeries / Procedures: Yes  Date of Onset of Symptoms: 02/14/2012  Guideline(s) Used:  No Protocol Available - Sick Adult  Disposition Per Guideline:   Discuss with PCP and Callback by Nurse within 1 Hour  Reason For Disposition Reached:   Nursing judgment  Advice Given:  N/A

## 2012-02-14 NOTE — Telephone Encounter (Signed)
Per CAN note, pt reported diclofenac does not work.

## 2012-02-14 NOTE — Telephone Encounter (Signed)
We refilled her Diclofenac, which is what I thought she was requesting.  I would try that first.

## 2012-02-14 NOTE — Patient Instructions (Signed)
Continue weight loss/weight maintenance efforts

## 2012-02-15 MED ORDER — OXYCODONE-ACETAMINOPHEN 5-325 MG PO TABS: 1.0000 | ORAL_TABLET | Freq: Four times a day (QID) | ORAL | Status: DC | PRN

## 2012-02-15 NOTE — Telephone Encounter (Signed)
Pt informed Rx ready to pick up

## 2012-02-24 ENCOUNTER — Other Ambulatory Visit (INDEPENDENT_AMBULATORY_CARE_PROVIDER_SITE_OTHER): Payer: Medicare Other

## 2012-02-24 DIAGNOSIS — R748 Abnormal levels of other serum enzymes: Secondary | ICD-10-CM | POA: Diagnosis not present

## 2012-02-24 DIAGNOSIS — Z79899 Other long term (current) drug therapy: Secondary | ICD-10-CM

## 2012-02-24 DIAGNOSIS — E785 Hyperlipidemia, unspecified: Secondary | ICD-10-CM | POA: Diagnosis not present

## 2012-02-24 LAB — BASIC METABOLIC PANEL
CO2: 25 mEq/L (ref 19–32)
Calcium: 10.2 mg/dL (ref 8.4–10.5)
Creatinine, Ser: 1.6 mg/dL — ABNORMAL HIGH (ref 0.4–1.2)
GFR: 33.63 mL/min — ABNORMAL LOW (ref >60.00)
Glucose, Bld: 97 mg/dL (ref 70–99)
Sodium: 136 mEq/L (ref 135–145)

## 2012-02-24 LAB — HEPATIC FUNCTION PANEL
ALT: 25 U/L (ref 0–35)
Total Protein: 7.4 g/dL (ref 6.0–8.3)

## 2012-02-25 ENCOUNTER — Telehealth: Payer: Self-pay | Admitting: Cardiovascular Disease

## 2012-02-25 NOTE — Telephone Encounter (Signed)
Pt aware of kidney values ./cy

## 2012-02-25 NOTE — Telephone Encounter (Signed)
New Problem:    Patient called in wanting to speak with you about her elevated kidney levels.  Please call back.

## 2012-03-01 ENCOUNTER — Encounter: Payer: Self-pay | Admitting: Family Medicine

## 2012-03-01 ENCOUNTER — Ambulatory Visit (INDEPENDENT_AMBULATORY_CARE_PROVIDER_SITE_OTHER): Payer: Medicare Other | Admitting: Family Medicine

## 2012-03-01 VITALS — BP 134/70 | HR 72 | Temp 97.7°F | Wt 152.0 lb

## 2012-03-01 DIAGNOSIS — N390 Urinary tract infection, site not specified: Secondary | ICD-10-CM

## 2012-03-01 DIAGNOSIS — R350 Frequency of micturition: Secondary | ICD-10-CM

## 2012-03-01 LAB — POCT URINALYSIS DIPSTICK
Leukocytes, UA: NEGATIVE
Nitrite, UA: POSITIVE
Protein, UA: 1
Urobilinogen, UA: 1

## 2012-03-01 MED ORDER — SULFAMETHOXAZOLE-TRIMETHOPRIM 800-160 MG PO TABS: 1.0000 | ORAL_TABLET | Freq: Two times a day (BID) | ORAL | Status: DC

## 2012-03-01 NOTE — Progress Notes (Addendum)
Chief Complaint  Patient presents with  . bladder pressure    HPI:  Acute visit for ? UTI: -started: a few weeks ago -symptoms: pressure in bladder when urinates, frequent urination -denies: fevers, dysuria, blood in urine, NVD, back pain or flank pain -Hx of: has hx of recurrent UTIs and has seen urology in the past for this, also has known chronic kidney disease -reports can't take cipro, but can take bactrim  ROS: See pertinent positives and negatives per HPI.  Past Medical History  Diagnosis Date  . Bleeding ulcer     22 years ago  . Hypertension   . Dyslipidemia   . Diverticulitis   . Arthritis   . Blood in stool   . Depression   . Headache   . Cardiac arrhythmia due to congenital heart disease   . Hyperlipidemia   . UTI (urinary tract infection)   . Acute ischemic colitis 1/12  . Migraine     Family History  Problem Relation Age of Onset  . Heart disease Mother     age 83  . Diabetes Mother   . Hypertension Mother   . Hypertension Sister     History   Social History  . Marital Status: Single    Spouse Name: N/A    Number of Children: N/A  . Years of Education: N/A   Social History Main Topics  . Smoking status: Former Smoker -- 2.00 packs/day for 10 years    Types: Cigarettes    Quit date: 01/19/1983  . Smokeless tobacco: Never Used  . Alcohol Use: None  . Drug Use: None  . Sexually Active: None   Other Topics Concern  . None   Social History Narrative   She retired early. She is non smoker. Never knew her father. Her mother died age 78 of heart disease. Her half sister Lucrezia [place]    Current outpatient prescriptions:amLODipine (NORVASC) 10 MG tablet, Take 1 tablet (10 mg total) by mouth daily., Disp: 90 tablet, Rfl: 3;  benazepril (LOTENSIN) 20 MG tablet, Take 1 tablet (20 mg total) by mouth daily., Disp: 90 tablet, Rfl: 3;  diclofenac (VOLTAREN) 75 MG EC tablet, Take 1 tablet (75 mg total) by mouth 2 (two) times daily., Disp: 60 tablet,  Rfl: 0 hydrochlorothiazide (HYDRODIURIL) 25 MG tablet, Take 0.5 tablets (12.5 mg total) by mouth daily., Disp: 90 tablet, Rfl: 3;  oxyCODONE-acetaminophen (PERCOCET/ROXICET) 5-325 MG per tablet, Take 1 tablet by mouth every 6 (six) hours as needed. For pain., Disp: 30 tablet, Rfl: 0;  rosuvastatin (CRESTOR) 10 MG tablet, Take 0.5 tablets (5 mg total) by mouth daily., Disp: 90 tablet, Rfl: 3 sulfamethoxazole-trimethoprim (BACTRIM DS,SEPTRA DS) 800-160 MG per tablet, Take 1 tablet by mouth 2 (two) times daily., Disp: 14 tablet, Rfl: 0  EXAM:  Filed Vitals:   03/01/12 0847  BP: 134/70  Pulse: 72  Temp: 97.7 F (36.5 C)    Body mass index is 23.8 kg/(m^2).  GENERAL: vitals reviewed and listed above, alert, oriented, appears well hydrated and in no acute distress  HEENT: atraumatic, conjunttiva clear, no obvious abnormalities on inspection of external nose and ears  NECK: no obvious masses on inspection  LUNGS: clear to auscultation bilaterally, no wheezes, rales or rhonchi, good air movement  ABD: soft, NTTP, no CVA TTP  CV: HRRR, no peripheral edema  MS: moves all extremities without noticeable abnormality  PSYCH: pleasant and cooperative, no obvious depression or anxiety  ASSESSMENT AND PLAN:  Discussed the following assessment and  plan:  1. Urinary frequency  POCT urinalysis dipstick   POCT urinalysis dipstick  2. Urinary tract infection, site not specified  sulfamethoxazole-trimethoprim (BACTRIM DS,SEPTRA DS) 800-160 MG per tablet   sulfamethoxazole-trimethoprim (BACTRIM DS,SEPTRA DS) 800-160 MG per tablet   -Udip with nitrites, P, glu, ket - and pt with symptoms of UTI, otherwise appears well - will get UA, micro and culture and tx in case UTI as pt is concern for UTI. Our udips have been rather inconsistent recently. Discussed risks with abx and dosed abx per GFR. Advised follow up with PCP to recheck urine and renal function. If no UTI on culture will refer to urology for  further evaluation. -Patient advised to return or notify a doctor immediately if symptoms worsen or persist or new concerns arise.  There are no Patient Instructions on file for this visit.   Kriste Basque R.

## 2012-03-01 NOTE — Addendum Note (Signed)
Addended by: Azucena Freed on: 03/01/2012 10:14 AM   Modules accepted: Orders

## 2012-03-01 NOTE — Patient Instructions (Addendum)
-  As we discussed, we have prescribed a new medication (Bactrim) for you at this appointment. We discussed the common and serious potential adverse effects of this medication and you can review these and more with the pharmacist when you pick up your medication.  Please follow the instructions for use carefully and notify us immediately if you have any problems taking this medication.  -please follow up with your doctor in 1-2 weeks to recheck urine and kidney function and ensure symptoms are resolving

## 2012-03-07 ENCOUNTER — Ambulatory Visit (INDEPENDENT_AMBULATORY_CARE_PROVIDER_SITE_OTHER): Payer: Medicare Other | Admitting: Family Medicine

## 2012-03-07 ENCOUNTER — Encounter: Payer: Self-pay | Admitting: Family Medicine

## 2012-03-07 ENCOUNTER — Other Ambulatory Visit (HOSPITAL_COMMUNITY)
Admission: RE | Admit: 2012-03-07 | Discharge: 2012-03-07 | Disposition: A | Discharge status: Home / Self Care | Payer: Medicare Other | Source: Ambulatory Visit | Attending: Family Medicine | Admitting: Family Medicine

## 2012-03-07 VITALS — HR 91 | Temp 98.0°F | Wt 152.0 lb

## 2012-03-07 DIAGNOSIS — N390 Urinary tract infection, site not specified: Secondary | ICD-10-CM | POA: Diagnosis not present

## 2012-03-07 DIAGNOSIS — N760 Acute vaginitis: Secondary | ICD-10-CM | POA: Diagnosis not present

## 2012-03-07 DIAGNOSIS — R300 Dysuria: Secondary | ICD-10-CM | POA: Diagnosis not present

## 2012-03-07 LAB — POCT URINALYSIS DIPSTICK
Blood, UA: NEGATIVE
Glucose, UA: NEGATIVE
Leukocytes, UA: NEGATIVE
Nitrite, UA: NEGATIVE

## 2012-03-07 NOTE — Progress Notes (Signed)
Chief Complaint  Patient presents with  . bladder pressure    HPI:  Acute visit for Bladder Pressure: -seen 2/12 for pressure in bladder (urgency), burning with urination at urethral opening, when urinating and frequent urination - tx empirically and UA with micro and culture ordered, but due to lab error not done -she is worried because she was contacted about lab error and wonders if could still have a UTI as has continued symptoms -pt reports hx of CKD and chronic urologic issues including recurrent UTIs for which she saw urology in the past - reports she saw urologist in the past and had to have urethral dilation - she wants a referral to see a urologist as she is worried about a stricture or a kidney stone -denies vaginal pruritis or discharge, fevers, vomiting, nausea, back pain or pelvic pain, urinary incontinence   ROS: See pertinent positives and negatives per HPI.  Past Medical History  Diagnosis Date  . Bleeding ulcer     22 years ago  . Hypertension   . Dyslipidemia   . Diverticulitis   . Arthritis   . Blood in stool   . Depression   . Headache   . Cardiac arrhythmia due to congenital heart disease   . Hyperlipidemia   . UTI (urinary tract infection)   . Acute ischemic colitis 1/12  . Migraine     Family History  Problem Relation Age of Onset  . Heart disease Mother     age 58  . Diabetes Mother   . Hypertension Mother   . Hypertension Sister     History   Social History  . Marital Status: Single    Spouse Name: N/A    Number of Children: N/A  . Years of Education: N/A   Social History Main Topics  . Smoking status: Former Smoker -- 2.00 packs/day for 10 years    Types: Cigarettes    Quit date: 01/19/1983  . Smokeless tobacco: Never Used  . Alcohol Use: None  . Drug Use: None  . Sexually Active: None   Other Topics Concern  . None   Social History Narrative   She retired early. She is non smoker. Never knew her father. Her mother died age 24  of heart disease. Her half sister Lucrezia [place]    Current outpatient prescriptions:amLODipine (NORVASC) 10 MG tablet, Take 1 tablet (10 mg total) by mouth daily., Disp: 90 tablet, Rfl: 3;  benazepril (LOTENSIN) 20 MG tablet, Take 1 tablet (20 mg total) by mouth daily., Disp: 90 tablet, Rfl: 3;  diclofenac (VOLTAREN) 75 MG EC tablet, Take 1 tablet (75 mg total) by mouth 2 (two) times daily., Disp: 60 tablet, Rfl: 0 hydrochlorothiazide (HYDRODIURIL) 25 MG tablet, Take 0.5 tablets (12.5 mg total) by mouth daily., Disp: 90 tablet, Rfl: 3;  oxyCODONE-acetaminophen (PERCOCET/ROXICET) 5-325 MG per tablet, Take 1 tablet by mouth every 6 (six) hours as needed. For pain., Disp: 30 tablet, Rfl: 0;  rosuvastatin (CRESTOR) 10 MG tablet, Take 0.5 tablets (5 mg total) by mouth daily., Disp: 90 tablet, Rfl: 3 sulfamethoxazole-trimethoprim (BACTRIM DS,SEPTRA DS) 800-160 MG per tablet, Take 1 tablet by mouth 2 (two) times daily., Disp: 14 tablet, Rfl: 0  EXAM:  Filed Vitals:   03/07/12 1324  Pulse: 91  Temp: 98 F (36.7 C)    Body mass index is 23.8 kg/(m^2).  GENERAL: vitals reviewed and listed above, alert, oriented, appears well hydrated and in no acute distress  HEENT: atraumatic, conjunttiva clear, no obvious abnormalities  on inspection of external nose and ears  NECK: no obvious masses on inspection  LUNGS: clear to auscultation bilaterally, no wheezes, rales or rhonchi, good air movement  CV: HRRR, no peripheral edema  ABD: BS +, soft, NTTP, no CVA TTP  GU: mild vulvar irritation, no irritation of urethral opening, otherwise normal appearance of external genitalia, no abnormal appearing vaginal discharge or lesions, did not appreciate any cystocele, or uterine prolapse  MS: moves all extremities without noticeable abnormality  PSYCH: pleasant and cooperative, no obvious depression or anxiety  ASSESSMENT AND PLAN:  Discussed the following assessment and plan:  Dysuria - Plan:  Ambulatory referral to Urology, Cervicovaginal ancillary only  Urinary tract infection, site not specified - Plan: POCT urinalysis dipstick, Cervicovaginal ancillary only  -pelvic exam and wet prep in case vaginitis causing symptoms -suspect possible vaginal atrophy/irritation/versus treated UTI -pt wants to see urologist and this is reasonable given hx - referral place -advised plenty of water in the meantime -Udip improved, culture pending today -Patient advised to return or notify a doctor immediately if symptoms worsen or persist or new concerns arise.  There are no Patient Instructions on file for this visit.   Kriste Basque R.

## 2012-03-07 NOTE — Addendum Note (Signed)
Addended by: Rita Ohara R on: 03/07/2012 03:06 PM   Modules accepted: Orders

## 2012-03-08 ENCOUNTER — Telehealth: Payer: Self-pay | Admitting: Family Medicine

## 2012-03-08 NOTE — Telephone Encounter (Signed)
Pt would like UA culture results

## 2012-03-09 ENCOUNTER — Telehealth: Payer: Self-pay | Admitting: Family Medicine

## 2012-03-09 NOTE — Telephone Encounter (Signed)
Pt states the urologist cannot see her until March 24th.  Pls advise if pt needs to be seen sooner.  Pt aware culture results are not resulted as of yet.

## 2012-03-09 NOTE — Telephone Encounter (Signed)
Called pt and made aware that culture results are still pending.  Pt is aware that she will be called when these are received.

## 2012-03-09 NOTE — Telephone Encounter (Signed)
Please let her know the swab with did for vaginal infections was negative.

## 2012-03-09 NOTE — Telephone Encounter (Signed)
Called and spoke with pt and pt is aware. Pt states she is still taking AZO when she needs to void. Pt states her back started hurting.

## 2012-03-10 ENCOUNTER — Telehealth: Payer: Self-pay | Admitting: Family Medicine

## 2012-03-10 ENCOUNTER — Other Ambulatory Visit (INDEPENDENT_AMBULATORY_CARE_PROVIDER_SITE_OTHER): Payer: Medicare Other

## 2012-03-10 DIAGNOSIS — R5381 Other malaise: Secondary | ICD-10-CM | POA: Diagnosis not present

## 2012-03-10 DIAGNOSIS — R7309 Other abnormal glucose: Secondary | ICD-10-CM

## 2012-03-10 DIAGNOSIS — I10 Essential (primary) hypertension: Secondary | ICD-10-CM | POA: Diagnosis not present

## 2012-03-10 DIAGNOSIS — R5383 Other fatigue: Secondary | ICD-10-CM

## 2012-03-10 DIAGNOSIS — E782 Mixed hyperlipidemia: Secondary | ICD-10-CM | POA: Diagnosis not present

## 2012-03-10 DIAGNOSIS — R6889 Other general symptoms and signs: Secondary | ICD-10-CM | POA: Diagnosis not present

## 2012-03-10 DIAGNOSIS — R7989 Other specified abnormal findings of blood chemistry: Secondary | ICD-10-CM

## 2012-03-10 DIAGNOSIS — R739 Hyperglycemia, unspecified: Secondary | ICD-10-CM

## 2012-03-10 DIAGNOSIS — N390 Urinary tract infection, site not specified: Secondary | ICD-10-CM

## 2012-03-10 LAB — URINE CULTURE: Colony Count: 75000

## 2012-03-10 LAB — HEPATIC FUNCTION PANEL
ALT: 15 U/L (ref 0–35)
Total Protein: 7.8 g/dL (ref 6.0–8.3)

## 2012-03-10 LAB — TSH: TSH: 1.36 u[IU]/mL (ref 0.35–5.50)

## 2012-03-10 LAB — BASIC METABOLIC PANEL
GFR: 37.94 mL/min — ABNORMAL LOW (ref >60.00)
Potassium: 4.7 mEq/L (ref 3.5–5.1)
Sodium: 138 mEq/L (ref 135–145)

## 2012-03-10 LAB — LIPID PANEL
Total CHOL/HDL Ratio: 3
VLDL: 20.6 mg/dL (ref 0.0–40.0)

## 2012-03-10 MED ORDER — NITROFURANTOIN MONOHYD MACRO 100 MG PO CAPS: 100.0000 mg | ORAL_CAPSULE | Freq: Two times a day (BID) | ORAL | Status: DC

## 2012-03-10 NOTE — Telephone Encounter (Signed)
Please let her know. The culture showed some bacteria - maybe a partially treated infection. Another abx sent to pharmacy to take for 7 days - twice daily. I hope this helps clear up her symptoms.

## 2012-03-10 NOTE — Telephone Encounter (Signed)
Called and spoke with pt and pt is aware.  

## 2012-03-14 ENCOUNTER — Ambulatory Visit (INDEPENDENT_AMBULATORY_CARE_PROVIDER_SITE_OTHER): Payer: Medicare Other | Admitting: Family Medicine

## 2012-03-14 VITALS — BP 128/68 | Temp 98.6°F | Wt 150.0 lb

## 2012-03-14 DIAGNOSIS — I10 Essential (primary) hypertension: Secondary | ICD-10-CM

## 2012-03-14 DIAGNOSIS — H6120 Impacted cerumen, unspecified ear: Secondary | ICD-10-CM

## 2012-03-14 DIAGNOSIS — E782 Mixed hyperlipidemia: Secondary | ICD-10-CM

## 2012-03-14 DIAGNOSIS — R7309 Other abnormal glucose: Secondary | ICD-10-CM | POA: Diagnosis not present

## 2012-03-14 DIAGNOSIS — H6122 Impacted cerumen, left ear: Secondary | ICD-10-CM

## 2012-03-14 DIAGNOSIS — R739 Hyperglycemia, unspecified: Secondary | ICD-10-CM

## 2012-03-14 DIAGNOSIS — N189 Chronic kidney disease, unspecified: Secondary | ICD-10-CM | POA: Diagnosis not present

## 2012-03-14 NOTE — Patient Instructions (Addendum)
Avoid regular use of NSAIDS such as Advil, Alleve, or Ibuprofen secondary to chronic kidney disease.

## 2012-03-14 NOTE — Progress Notes (Signed)
  Subjective:    Patient ID: Madison Hernandez, female    DOB: 1940/08/18, 72 y.o.   MRN: [id number]  HPI Here for several items  Left ear fullness. Concern for cerumen impaction. She's had similar in the past. She has chronic perforation right TM. She's noticed some decreased hearing left ear recently. No drainage.  Hypertension treated with amlodipine, benazepril, HCTZ. No headaches or dizziness. She has chronic kidney disease with recent creatinine 1.6 and followup 1.5. Possibly some mild prerenal azotemia. Her diuretic was reduced to half dosage recently. She does not take any regular nonsteroidals  Recent dysuria. Improved on antibiotics and symptoms have resolved at this time. She is not sure she wishes to proceed with urology referral  Previous elevated blood sugar 131 fasting with A1c 6.7%. Patient has reduced carbohydrate intake. Repeat A1c 2 days ago 6.2%. No symptoms of hyperglycemia  Past Medical History  Diagnosis Date  . Bleeding ulcer     22 years ago  . Hypertension   . Dyslipidemia   . Diverticulitis   . Arthritis   . Blood in stool   . Depression   . Headache   . Cardiac arrhythmia due to congenital heart disease   . Hyperlipidemia   . UTI (urinary tract infection)   . Acute ischemic colitis 1/12  . Migraine    Past Surgical History  Procedure Laterality Date  . Bleeding ulcers      22 years ago  . Vein procedures      right and left lower extremity vein procedures    reports that she quit smoking about 29 years ago. Her smoking use included Cigarettes. She has a 20 pack-year smoking history. She has never used smokeless tobacco. Her alcohol and drug histories are not on file. family history includes Diabetes in her mother; Heart disease in her mother; and Hypertension in her mother and sister. Allergies  Allergen Reactions  . Avelox (Moxifloxacin Hcl In Nacl) Shortness Of Breath  . Mucinex (Guaifenesin Er)     "Makes my heart flutter"      Review of  Systems  Constitutional: Negative for fever, chills and fatigue.  Eyes: Negative for visual disturbance.  Respiratory: Negative for cough, chest tightness, shortness of breath and wheezing.   Cardiovascular: Negative for chest pain, palpitations and leg swelling.  Genitourinary: Negative for dysuria.  Neurological: Negative for dizziness, seizures, syncope, weakness, light-headedness and headaches.       Objective:   Physical Exam  Constitutional: She appears well-developed and well-nourished. No distress.  HENT:  Right TM reveals chronic perforation. No active drainage. Left ear canal impacted with cerumen. Removed with curette without difficulty  Cardiovascular: Normal rate and regular rhythm.   Pulmonary/Chest: Effort normal and breath sounds normal. No respiratory distress. She has no wheezes. She has no rales.  Musculoskeletal: She exhibits no edema.          Assessment & Plan:  #1 cerumen impaction left ear canal. Removed with curette #2 hypertension. Stable at goal #3 hyperglycemia. 1 fasting blood sugar over 126. Recent A1c improved with weight control #4 chronic kidney disease. Recheck basic metabolic panel in 3 months. Avoid nonsteroidals #5 hyperlipidemia. Recent lipids at goal #6 recent abnormal TSH with followup normal range.

## 2012-03-17 ENCOUNTER — Ambulatory Visit: Payer: Medicare Other | Admitting: Family Medicine

## 2012-03-17 DIAGNOSIS — R339 Retention of urine, unspecified: Secondary | ICD-10-CM | POA: Diagnosis not present

## 2012-03-17 DIAGNOSIS — N3000 Acute cystitis without hematuria: Secondary | ICD-10-CM | POA: Diagnosis not present

## 2012-04-20 ENCOUNTER — Encounter: Payer: Self-pay | Admitting: Family Medicine

## 2012-04-20 ENCOUNTER — Ambulatory Visit (INDEPENDENT_AMBULATORY_CARE_PROVIDER_SITE_OTHER): Payer: Medicare Other | Admitting: Family Medicine

## 2012-04-20 VITALS — BP 130/60 | Temp 98.1°F | Wt 148.0 lb

## 2012-04-20 DIAGNOSIS — M546 Pain in thoracic spine: Secondary | ICD-10-CM

## 2012-04-20 MED ORDER — CYCLOBENZAPRINE HCL 5 MG PO TABS: 5.0000 mg | ORAL_TABLET | Freq: Three times a day (TID) | ORAL | Status: DC | PRN

## 2012-04-20 MED ORDER — OXYCODONE-ACETAMINOPHEN 5-325 MG PO TABS: 1.0000 | ORAL_TABLET | Freq: Four times a day (QID) | ORAL | Status: DC | PRN

## 2012-04-20 NOTE — Progress Notes (Signed)
  Subjective:    Patient ID: Madison Hernandez, female    DOB: Oct 09, 1940, 72 y.o.   MRN: [id number]  HPI  Patient seen with recurrent back pain. She's had repeated episodes of left thoracic back pain. These generally last just a few days and are relieved with oxycodone but not nonsteroidals. She describes a burning type pain never associated with the rash. She denies any cough, pleuritic pain, hemoptysis. Pain worse with movement. She is no associated abdominal pain. No radiation of pain. She tried over-the-counter analgesics without improvement. Usually resolves after a few days.  Patient has good appetite. She's lost some weight in the past year but due to her efforts  Past Medical History  Diagnosis Date  . Bleeding ulcer     22 years ago  . Hypertension   . Dyslipidemia   . Diverticulitis   . Arthritis   . Blood in stool   . Depression   . Headache   . Cardiac arrhythmia due to congenital heart disease   . Hyperlipidemia   . UTI (urinary tract infection)   . Acute ischemic colitis 1/12  . Migraine    Past Surgical History  Procedure Laterality Date  . Bleeding ulcers      22 years ago  . Vein procedures      right and left lower extremity vein procedures    reports that she quit smoking about 29 years ago. Her smoking use included Cigarettes. She has a 20 pack-year smoking history. She has never used smokeless tobacco. Her alcohol and drug histories are not on file. family history includes Diabetes in her mother; Heart disease in her mother; and Hypertension in her mother and sister. Allergies  Allergen Reactions  . Avelox (Moxifloxacin Hcl In Nacl) Shortness Of Breath  . Mucinex (Guaifenesin Er)     "Makes my heart flutter"     Review of Systems  Constitutional: Negative for fever, chills and appetite change.  Respiratory: Negative for cough and shortness of breath.   Cardiovascular: Negative for chest pain.  Gastrointestinal: Negative for abdominal pain.   Musculoskeletal: Positive for back pain.       Objective:   Physical Exam  Constitutional: She appears well-developed and well-nourished. No distress.  Cardiovascular: Normal rate and regular rhythm.   Pulmonary/Chest: Effort normal and breath sounds normal. No respiratory distress. She has no wheezes. She has no rales.  Abdominal: Soft. Bowel sounds are normal. She exhibits no distension and no mass. There is no tenderness. There is no rebound and no guarding.  Musculoskeletal:  Patient some localized tenderness midthoracic area lateral to the spine around T. 8 region. There is no evidence for rash. No erythema.          Assessment & Plan:  Recurrent left-sided midthoracic pain. worse with movement and locolized tenderness suggest musculoskeletal origin. No spinal tenderness. She had recurrent episodes and each seem to get better completely on its own. Sounds more neuropathic with burning quality but she's never had rash. We discussed possible suppression with gabapentin but because these are intermittent she is not interested at this time. Refill Percocet for limited use. We've advised against long-term use. Also refilled Flexeril which has helped in the past.

## 2012-05-25 ENCOUNTER — Encounter: Payer: Self-pay | Admitting: Family Medicine

## 2012-05-25 ENCOUNTER — Ambulatory Visit (INDEPENDENT_AMBULATORY_CARE_PROVIDER_SITE_OTHER): Payer: Medicare Other | Admitting: Family Medicine

## 2012-05-25 VITALS — BP 130/70 | Temp 98.0°F | Wt 146.0 lb

## 2012-05-25 DIAGNOSIS — H8110 Benign paroxysmal vertigo, unspecified ear: Secondary | ICD-10-CM

## 2012-05-25 NOTE — Progress Notes (Signed)
  Subjective:    Patient ID: Madison Hernandez, female    DOB: 06/19/1940, 72 y.o.   MRN: [id number]  HPI Vertigo first noted today getting out of bed Symptoms are relatively mild this time No headaches, speech changes, focal weakness, or any dysphagia.  No hearing changes.  No visual changes. No syncope or presyncope. No ataxia.  Exacerbated by movement of head Denies any significant nausea or vomiting  Past Medical History  Diagnosis Date  . Bleeding ulcer     22 years ago  . Hypertension   . Dyslipidemia   . Diverticulitis   . Arthritis   . Blood in stool   . Depression   . Headache   . Cardiac arrhythmia due to congenital heart disease   . Hyperlipidemia   . UTI (urinary tract infection)   . Acute ischemic colitis 1/12  . Migraine    Past Surgical History  Procedure Laterality Date  . Bleeding ulcers      22 years ago  . Vein procedures      right and left lower extremity vein procedures    reports that she quit smoking about 29 years ago. Her smoking use included Cigarettes. She has a 20 pack-year smoking history. She has never used smokeless tobacco. Her alcohol and drug histories are not on file. family history includes Diabetes in her mother; Heart disease in her mother; and Hypertension in her mother and sister. Allergies  Allergen Reactions  . Avelox (Moxifloxacin Hcl In Nacl) Shortness Of Breath  . Mucinex (Guaifenesin Er)     "Makes my heart flutter"      Review of Systems  Constitutional: Negative for fever, appetite change and unexpected weight change.  Eyes: Negative for visual disturbance.  Respiratory: Negative for shortness of breath.   Cardiovascular: Negative for chest pain.  Gastrointestinal: Negative for abdominal pain.  Genitourinary: Negative for dysuria.  Neurological: Positive for dizziness. Negative for syncope, weakness and headaches.  Hematological: Negative for adenopathy.  Psychiatric/Behavioral: Negative for confusion.        Objective:   Physical Exam  Constitutional: She is oriented to person, place, and time. She appears well-developed and well-nourished.  Eyes: Pupils are equal, round, and reactive to light.  Neck: Neck supple. No thyromegaly present.  Cardiovascular: Normal rate and regular rhythm.   Pulmonary/Chest: Effort normal and breath sounds normal. No respiratory distress. She has no wheezes. She has no rales.  Musculoskeletal: She exhibits no edema.  Lymphadenopathy:    She has no cervical adenopathy.  Neurological: She is alert and oriented to person, place, and time. No cranial nerve deficit. Coordination normal.  No focal weakness  Psychiatric: She has a normal mood and affect. Her behavior is normal. Judgment and thought content normal.          Assessment & Plan:  Vertigo. Suspect benign positional vertigo. Nonfocal exam. Reassurance. Medications such as meclizine only if she has any nausea. Touch base by next week if not resolving and sooner if any new symptoms

## 2012-05-25 NOTE — Patient Instructions (Addendum)
Benign Positional Vertigo Vertigo means you feel like you or your surroundings are moving when they are not. Benign positional vertigo is the most common form of vertigo. Benign means that the cause of your condition is not serious. Benign positional vertigo is more common in older adults. CAUSES  Benign positional vertigo is the result of an upset in the labyrinth system. This is an area in the middle ear that helps control your balance. This may be caused by a viral infection, head injury, or repetitive motion. However, often no specific cause is found. SYMPTOMS  Symptoms of benign positional vertigo occur when you move your head or eyes in different directions. Some of the symptoms may include:  Loss of balance and falls.  Vomiting.  Blurred vision.  Dizziness.  Nausea.  Involuntary eye movements (nystagmus). DIAGNOSIS  Benign positional vertigo is usually diagnosed by physical exam. If the specific cause of your benign positional vertigo is unknown, your caregiver may perform imaging tests, such as magnetic resonance imaging (MRI) or computed tomography (CT). TREATMENT  Your caregiver may recommend movements or procedures to correct the benign positional vertigo. Medicines such as meclizine, benzodiazepines, and medicines for nausea may be used to treat your symptoms. In rare cases, if your symptoms are caused by certain conditions that affect the inner ear, you may need surgery. HOME CARE INSTRUCTIONS   Follow your caregiver's instructions.  Move slowly. Do not make sudden body or head movements.  Avoid driving.  Avoid operating heavy machinery.  Avoid performing any tasks that would be dangerous to you or others during a vertigo episode.  Drink enough fluids to keep your urine clear or pale yellow. SEEK IMMEDIATE MEDICAL CARE IF:   You develop problems with walking, weakness, numbness, or using your arms, hands, or legs.  You have difficulty speaking.  You develop  severe headaches.  Your nausea or vomiting continues or gets worse.  You develop visual changes.  Your family or friends notice any behavioral changes.  Your condition gets worse.  You have a fever.  You develop a stiff neck or sensitivity to light. MAKE SURE YOU:   Understand these instructions.  Will watch your condition.  Will get help right away if you are not doing well or get worse. Document Released: 10/12/2005 Document Revised: 03/29/2011 Document Reviewed: 09/24/2010 [facility] Patient Information 2013 [place], [place].  Touch base by next week if not improved.

## 2012-07-17 ENCOUNTER — Ambulatory Visit (INDEPENDENT_AMBULATORY_CARE_PROVIDER_SITE_OTHER): Payer: Medicare Other | Admitting: Family Medicine

## 2012-07-17 ENCOUNTER — Other Ambulatory Visit: Payer: Self-pay | Admitting: *Deleted

## 2012-07-17 ENCOUNTER — Encounter: Payer: Self-pay | Admitting: Family Medicine

## 2012-07-17 ENCOUNTER — Encounter: Payer: Self-pay | Admitting: *Deleted

## 2012-07-17 VITALS — BP 130/62 | HR 89 | Temp 97.8°F | Ht 66.0 in | Wt 147.0 lb

## 2012-07-17 DIAGNOSIS — H6690 Otitis media, unspecified, unspecified ear: Secondary | ICD-10-CM

## 2012-07-17 DIAGNOSIS — B9789 Other viral agents as the cause of diseases classified elsewhere: Secondary | ICD-10-CM | POA: Diagnosis not present

## 2012-07-17 DIAGNOSIS — B349 Viral infection, unspecified: Secondary | ICD-10-CM

## 2012-07-17 DIAGNOSIS — H6691 Otitis media, unspecified, right ear: Secondary | ICD-10-CM

## 2012-07-17 MED ORDER — CIPROFLOXACIN-DEXAMETHASONE 0.3-0.1 % OT SUSP: 4.0000 [drp] | Freq: Two times a day (BID) | OTIC | Status: DC

## 2012-07-17 MED ORDER — OXYCODONE-ACETAMINOPHEN 5-325 MG PO TABS: 1.0000 | ORAL_TABLET | Freq: Four times a day (QID) | ORAL | Status: DC | PRN

## 2012-07-17 NOTE — Patient Instructions (Signed)
Viral Syndrome You or your child has Viral Syndrome. It is the most common infection causing "colds" and infections in the nose, throat, sinuses, and breathing tubes. Sometimes the infection causes nausea, vomiting, or diarrhea. The germ that causes the infection is a virus. No antibiotic or other medicine will kill it. There are medicines that you can take to make you or your child more comfortable.  HOME CARE INSTRUCTIONS   Rest in bed until you start to feel better.  If you have diarrhea or vomiting, eat small amounts of crackers and toast. Soup is helpful.  Do not give aspirin or medicine that contains aspirin to children.  Only take over-the-counter or prescription medicines for pain, discomfort, or fever as directed by your caregiver. SEEK IMMEDIATE MEDICAL CARE IF:   You or your child has not improved within one week.  You or your child has pain that is not at least partially relieved by over-the-counter medicine.  Thick, colored mucus or blood is coughed up.  Discharge from the nose becomes thick yellow or green.  Diarrhea or vomiting gets worse.  There is any major change in your or your child's condition.  You or your child develops a skin rash, stiff neck, severe headache, or are unable to hold down food or fluid.  You or your child has an oral temperature above 102 F (38.9 C), not controlled by medicine.  Your baby is older than 3 months with a rectal temperature of 102 F (38.9 C) or higher.  Your baby is 2 months old or younger with a rectal temperature of 100.4 F (38 C) or higher. Document Released: 12/20/2005 Document Revised: 03/29/2011 Document Reviewed: 12/21/2006 [facility] Patient Information 2014 [place], [place].  Keep water out of right ear.

## 2012-07-17 NOTE — Progress Notes (Signed)
  Subjective:    Patient ID: Madison Hernandez, female    DOB: Sep 30, 1940, 72 y.o.   MRN: [id number]  HPI Acute visit Patient seen with nonspecific symptoms of malaise, intermittent headaches, body aches, nasal congestion, frontal and maxillary sinus pressure. Onset of symptoms yesterday. She's had chronic perforation right eardrum with some recent drainage past couple days. Mostly clear drainage. Denies any fever or chills Has taken Ciprodex for ear in the past which has helped She denies any nausea, vomiting, dizziness, vertigo, or any recent hearing changes  Past Medical History  Diagnosis Date  . Bleeding ulcer     22 years ago  . Hypertension   . Dyslipidemia   . Diverticulitis   . Arthritis   . Blood in stool   . Depression   . Headache(784.0)   . Cardiac arrhythmia due to congenital heart disease   . Hyperlipidemia   . UTI (urinary tract infection)   . Acute ischemic colitis 1/12  . Migraine    Past Surgical History  Procedure Laterality Date  . Bleeding ulcers      22 years ago  . Vein procedures      right and left lower extremity vein procedures    reports that she quit smoking about 29 years ago. Her smoking use included Cigarettes. She has a 20 pack-year smoking history. She has never used smokeless tobacco. Her alcohol and drug histories are not on file. family history includes Diabetes in her mother; Heart disease in her mother; and Hypertension in her mother and sister. Allergies  Allergen Reactions  . Avelox (Moxifloxacin Hcl In Nacl) Shortness Of Breath  . Mucinex (Guaifenesin Er)     "Makes my heart flutter"      Review of Systems  Constitutional: Positive for fatigue. Negative for fever and chills.  HENT: Positive for ear discharge. Negative for hearing loss and neck stiffness.   Respiratory: Negative for cough and shortness of breath.   Cardiovascular: Negative for chest pain.  Musculoskeletal: Positive for myalgias.  Skin: Negative for rash.   Neurological: Positive for headaches.       Objective:   Physical Exam  Constitutional: She appears well-developed and well-nourished.  HENT:  Left Ear: External ear normal.  Mouth/Throat: Oropharynx is clear and moist.  Patient has chronic perforation right eardrum posteriorly with clear drainage.  Neck: Neck supple. No thyromegaly present.  Cardiovascular: Normal rate.   Pulmonary/Chest: Effort normal and breath sounds normal. No respiratory distress. She has no wheezes. She has no rales.  Lymphadenopathy:    She has no cervical adenopathy.          Assessment & Plan:  Viral syndrome. Patient has chronic perforation right eardrum. Refill Ciprodex twice daily for as needed use. Keep water out of ear. Consider Advil or Aleve for symptomatic relief. Followup when necessary

## 2012-08-31 DIAGNOSIS — R339 Retention of urine, unspecified: Secondary | ICD-10-CM | POA: Diagnosis not present

## 2012-08-31 DIAGNOSIS — N3000 Acute cystitis without hematuria: Secondary | ICD-10-CM | POA: Diagnosis not present

## 2012-09-07 ENCOUNTER — Ambulatory Visit (INDEPENDENT_AMBULATORY_CARE_PROVIDER_SITE_OTHER): Payer: Medicare Other | Admitting: Family Medicine

## 2012-09-07 ENCOUNTER — Other Ambulatory Visit: Payer: Self-pay | Admitting: Cardiovascular Disease

## 2012-09-07 ENCOUNTER — Encounter: Payer: Self-pay | Admitting: Family Medicine

## 2012-09-07 VITALS — BP 128/62 | HR 83 | Temp 97.3°F | Wt 150.0 lb

## 2012-09-07 DIAGNOSIS — M546 Pain in thoracic spine: Secondary | ICD-10-CM

## 2012-09-07 MED ORDER — OXYCODONE-ACETAMINOPHEN 5-325 MG PO TABS: 1.0000 | ORAL_TABLET | Freq: Four times a day (QID) | ORAL | Status: DC | PRN

## 2012-09-07 NOTE — Patient Instructions (Addendum)
Try topical heat and muscle massage.

## 2012-09-07 NOTE — Progress Notes (Signed)
  Subjective:    Patient ID: Madison Hernandez, female    DOB: 29-Oct-1940, 72 y.o.   MRN: [id number]  HPI Acute visit for right lower thoracic back pain. Patient's had similar back pain in the past, though other locations. She does not recall injury. She's had some recent urinary frequency and dysuria and saw urologist. Apparently placed on Bactrim DS Denies any fever or chills. No current urinary symptoms other than frequency.  Back pain is lower thoracic region. No rash. Dull ache. Moderate intensity. Not relieved with Aleve, heat, ice, or Tylenol. She's taken short-term use of oxycodone the past which helps. She tried muscle relaxer with Flexeril which did not help  Past Medical History  Diagnosis Date  . Bleeding ulcer     22 years ago  . Hypertension   . Dyslipidemia   . Diverticulitis   . Arthritis   . Blood in stool   . Depression   . Headache(784.0)   . Cardiac arrhythmia due to congenital heart disease   . Hyperlipidemia   . UTI (urinary tract infection)   . Acute ischemic colitis 1/12  . Migraine    Past Surgical History  Procedure Laterality Date  . Bleeding ulcers      22 years ago  . Vein procedures      right and left lower extremity vein procedures    reports that she quit smoking about 29 years ago. Her smoking use included Cigarettes. She has a 20 pack-year smoking history. She has never used smokeless tobacco. Her alcohol and drug histories are not on file. family history includes Diabetes in her mother; Heart disease in her mother; Hypertension in her mother and sister. Allergies  Allergen Reactions  . Avelox [Moxifloxacin Hcl In Nacl] Shortness Of Breath  . Mucinex [Guaifenesin Er]     "Makes my heart flutter"      Review of Systems  Constitutional: Negative for fever, chills, appetite change and unexpected weight change.  Respiratory: Negative for cough and shortness of breath.   Cardiovascular: Negative for chest pain.  Genitourinary: Negative for  dysuria.  Musculoskeletal: Positive for back pain.  Skin: Negative for rash.       Objective:   Physical Exam  Constitutional: She appears well-developed and well-nourished.  Cardiovascular: Normal rate and regular rhythm.   Pulmonary/Chest: Effort normal and breath sounds normal. No respiratory distress. She has no wheezes. She has no rales.  Musculoskeletal:  Straight leg raises are negative. Patient has some mild tenderness right lower thoracic region. No spinal tenderness. No visible swelling or erythema.  Neurological:  Full-strength lower extremities  Skin: No rash noted.          Assessment & Plan:  Nonspecific right lower thoracic back pain. Suspect muscular. Continued heat or ice. Limited Percocet 5 mg one every 6 hours. Consider physical therapy if no better in one to 2 weeks

## 2012-09-13 ENCOUNTER — Other Ambulatory Visit: Payer: Self-pay | Admitting: *Deleted

## 2012-09-13 MED ORDER — AMLODIPINE BESYLATE 10 MG PO TABS: 10.0000 mg | ORAL_TABLET | Freq: Every day | ORAL | Status: DC

## 2012-09-20 DIAGNOSIS — R3989 Other symptoms and signs involving the genitourinary system: Secondary | ICD-10-CM | POA: Diagnosis not present

## 2012-09-20 DIAGNOSIS — N3000 Acute cystitis without hematuria: Secondary | ICD-10-CM | POA: Diagnosis not present

## 2012-09-29 DIAGNOSIS — Z23 Encounter for immunization: Secondary | ICD-10-CM | POA: Diagnosis not present

## 2012-10-23 DIAGNOSIS — N3010 Interstitial cystitis (chronic) without hematuria: Secondary | ICD-10-CM | POA: Diagnosis not present

## 2012-11-20 DIAGNOSIS — I8310 Varicose veins of unspecified lower extremity with inflammation: Secondary | ICD-10-CM | POA: Diagnosis not present

## 2012-12-12 DIAGNOSIS — I8310 Varicose veins of unspecified lower extremity with inflammation: Secondary | ICD-10-CM | POA: Diagnosis not present

## 2012-12-26 DIAGNOSIS — I8310 Varicose veins of unspecified lower extremity with inflammation: Secondary | ICD-10-CM | POA: Diagnosis not present

## 2012-12-26 DIAGNOSIS — M79609 Pain in unspecified limb: Secondary | ICD-10-CM | POA: Diagnosis not present

## 2013-01-12 DIAGNOSIS — I8310 Varicose veins of unspecified lower extremity with inflammation: Secondary | ICD-10-CM | POA: Diagnosis not present

## 2013-01-12 DIAGNOSIS — M79609 Pain in unspecified limb: Secondary | ICD-10-CM | POA: Diagnosis not present

## 2013-01-26 DIAGNOSIS — I8310 Varicose veins of unspecified lower extremity with inflammation: Secondary | ICD-10-CM | POA: Diagnosis not present

## 2013-01-26 DIAGNOSIS — M79609 Pain in unspecified limb: Secondary | ICD-10-CM | POA: Diagnosis not present

## 2013-02-02 ENCOUNTER — Other Ambulatory Visit: Payer: Self-pay | Admitting: Cardiovascular Disease

## 2013-02-27 ENCOUNTER — Other Ambulatory Visit: Payer: Self-pay | Admitting: Cardiovascular Disease

## 2013-02-28 ENCOUNTER — Other Ambulatory Visit: Payer: Self-pay | Admitting: *Deleted

## 2013-03-09 ENCOUNTER — Encounter: Payer: Self-pay | Admitting: Family Medicine

## 2013-03-09 ENCOUNTER — Ambulatory Visit (INDEPENDENT_AMBULATORY_CARE_PROVIDER_SITE_OTHER): Payer: Medicare Other | Admitting: Family Medicine

## 2013-03-09 VITALS — BP 132/68 | HR 86 | Temp 97.4°F | Wt 159.0 lb

## 2013-03-09 DIAGNOSIS — M546 Pain in thoracic spine: Secondary | ICD-10-CM | POA: Diagnosis not present

## 2013-03-09 MED ORDER — OXYCODONE-ACETAMINOPHEN 5-325 MG PO TABS: 1.0000 | ORAL_TABLET | Freq: Four times a day (QID) | ORAL | Status: DC | PRN

## 2013-03-09 NOTE — Patient Instructions (Signed)
Continue with heat or ice for symptomatic relief.

## 2013-03-09 NOTE — Progress Notes (Signed)
   Subjective:    Patient ID: Madison Hernandez, female    DOB: Apr 25, 1940, 73 y.o.   MRN: [id number]  Back Pain Pertinent negatives include no abdominal pain, chest pain, fever, numbness or weakness.   Patient seen with right mid thoracic back pain. Onset yesterday after sneezing. She has had similar type pain in the past. She denies any cough. No pleuritic pain. No hemoptysis. No chest pains. No abdominal pain. She describes a burning type pain which is relatively constant but does wax and wane. She tried Aleve without much improvement. Heat without much improvement. No skin rash. She's taken oxycodone briefly in the past which was only thing that seemed to help. She previously used muscle relaxers which did not help  Past Medical History  Diagnosis Date  . Bleeding ulcer     22 years ago  . Hypertension   . Dyslipidemia   . Diverticulitis   . Arthritis   . Blood in stool   . Depression   . Headache(784.0)   . Cardiac arrhythmia due to congenital heart disease   . Hyperlipidemia   . UTI (urinary tract infection)   . Acute ischemic colitis 1/12  . Migraine    Past Surgical History  Procedure Laterality Date  . Bleeding ulcers      22 years ago  . Vein procedures      right and left lower extremity vein procedures    reports that she quit smoking about 30 years ago. Her smoking use included Cigarettes. She has a 20 pack-year smoking history. She has never used smokeless tobacco. Her alcohol and drug histories are not on file. family history includes Diabetes in her mother; Heart disease in her mother; Hypertension in her mother and sister. Allergies  Allergen Reactions  . Avelox [Moxifloxacin Hcl In Nacl] Shortness Of Breath  . Mucinex [Guaifenesin Er]     "Makes my heart flutter"      Review of Systems  Constitutional: Negative for fever, chills and appetite change.  Respiratory: Negative for cough and shortness of breath.   Cardiovascular: Negative for chest pain.    Gastrointestinal: Negative for abdominal pain.  Musculoskeletal: Positive for back pain.  Skin: Negative for rash.  Neurological: Negative for weakness and numbness.       Objective:   Physical Exam  Constitutional: She appears well-developed and well-nourished.  Cardiovascular: Normal rate.   Pulmonary/Chest: Effort normal and breath sounds normal. No respiratory distress. She has no wheezes. She has no rales.  Musculoskeletal:  She has some minimal paraspinous muscular tenderness mid left thoracic region. No rash          Assessment & Plan:  Musculoskeletal pain left mid thoracic region. Continue heat or ice. Limited oxycodone 5 mg for severe pain. She already tried nonsteroidals without improvement. Consider physical therapy by next week if no improvement

## 2013-04-05 ENCOUNTER — Ambulatory Visit (INDEPENDENT_AMBULATORY_CARE_PROVIDER_SITE_OTHER): Payer: Medicare Other | Admitting: Family Medicine

## 2013-04-05 ENCOUNTER — Encounter: Payer: Self-pay | Admitting: Family Medicine

## 2013-04-05 VITALS — BP 132/68 | HR 95 | Temp 97.5°F | Wt 158.0 lb

## 2013-04-05 DIAGNOSIS — H6122 Impacted cerumen, left ear: Secondary | ICD-10-CM

## 2013-04-05 DIAGNOSIS — H6120 Impacted cerumen, unspecified ear: Secondary | ICD-10-CM

## 2013-04-05 DIAGNOSIS — E782 Mixed hyperlipidemia: Secondary | ICD-10-CM

## 2013-04-05 DIAGNOSIS — R7309 Other abnormal glucose: Secondary | ICD-10-CM | POA: Diagnosis not present

## 2013-04-05 DIAGNOSIS — I10 Essential (primary) hypertension: Secondary | ICD-10-CM

## 2013-04-05 DIAGNOSIS — N189 Chronic kidney disease, unspecified: Secondary | ICD-10-CM | POA: Diagnosis not present

## 2013-04-05 DIAGNOSIS — R739 Hyperglycemia, unspecified: Secondary | ICD-10-CM

## 2013-04-05 LAB — LIPID PANEL
Cholesterol: 231 mg/dL — ABNORMAL HIGH (ref 0–200)
HDL: 64.5 mg/dL (ref >39.00)
LDL Cholesterol: 142 mg/dL — ABNORMAL HIGH (ref 0–99)
TRIGLYCERIDES: 124 mg/dL (ref 0.0–149.0)
Total CHOL/HDL Ratio: 4
VLDL: 24.8 mg/dL (ref 0.0–40.0)

## 2013-04-05 LAB — HEPATIC FUNCTION PANEL
ALBUMIN: 5 g/dL (ref 3.5–5.2)
ALT: 26 U/L (ref 0–35)
AST: 23 U/L (ref 0–37)
Alkaline Phosphatase: 63 U/L (ref 39–117)
Bilirubin, Direct: 0 mg/dL (ref 0.0–0.3)
TOTAL PROTEIN: 8.1 g/dL (ref 6.0–8.3)
Total Bilirubin: 0.5 mg/dL (ref 0.3–1.2)

## 2013-04-05 LAB — HEMOGLOBIN A1C: Hgb A1c MFr Bld: 6.6 % — ABNORMAL HIGH (ref 4.6–6.5)

## 2013-04-05 LAB — BASIC METABOLIC PANEL
BUN: 30 mg/dL — ABNORMAL HIGH (ref 6–23)
CALCIUM: 9.9 mg/dL (ref 8.4–10.5)
CO2: 26 mEq/L (ref 19–32)
Chloride: 100 mEq/L (ref 96–112)
Creatinine, Ser: 1.2 mg/dL (ref 0.4–1.2)
GFR: 48.45 mL/min — AB (ref >60.00)
Glucose, Bld: 113 mg/dL — ABNORMAL HIGH (ref 70–99)
POTASSIUM: 4.3 meq/L (ref 3.5–5.1)
SODIUM: 138 meq/L (ref 135–145)

## 2013-04-05 NOTE — Progress Notes (Signed)
   Subjective:    Patient ID: Madison Hernandez, female    DOB: 06-23-1940, 73 y.o.   MRN: [id number]  Ear Fullness  Associated symptoms include hearing loss. Pertinent negatives include no coughing, ear discharge or headaches.   Patient seen with acute issue of left ear fullness. Onset a few days ago after getting some water in her ear. She's had problems with cerumen impaction the past. 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 She has not had any recent dizziness.  Her chronic problems include history of hypertension, hyperlipidemia, chronic kidney disease, history of prediabetes. Current medications reviewed and compliant with all. She has not had lab work in 1 year. Denies any recent chest pains, dizziness, headaches, or any dyspnea. Mood is stable. She has done excellent job with weight loss during the past year that he has recently gained some weight back. No symptoms of hyperglycemia.  Past Medical History  Diagnosis Date  . Bleeding ulcer     22 years ago  . Hypertension   . Dyslipidemia   . Diverticulitis   . Arthritis   . Blood in stool   . Depression   . Headache(784.0)   . Cardiac arrhythmia due to congenital heart disease   . Hyperlipidemia   . UTI (urinary tract infection)   . Acute ischemic colitis 1/12  . Migraine    Past Surgical History  Procedure Laterality Date  . Bleeding ulcers      22 years ago  . Vein procedures      right and left lower extremity vein procedures    reports that she quit smoking about 30 years ago. Her smoking use included Cigarettes. She has a 20 pack-year smoking history. She has never used smokeless tobacco. Her alcohol and drug histories are not on file. family history includes Diabetes in her mother; Heart disease in her mother; Hypertension in her mother and sister. Allergies  Allergen Reactions  . Avelox [Moxifloxacin Hcl In Nacl] Shortness Of Breath  . Mucinex [Guaifenesin Er]     "Makes my heart  flutter"      Review of Systems  Constitutional: Negative for fatigue.  HENT: Positive for hearing loss. Negative for ear discharge and ear pain.   Eyes: Negative for visual disturbance.  Respiratory: Negative for cough, chest tightness, shortness of breath and wheezing.   Cardiovascular: Negative for chest pain, palpitations and leg swelling.  Endocrine: Negative for polydipsia and polyuria.  Neurological: Negative for dizziness, seizures, syncope, weakness, light-headedness and headaches.       Objective:   Physical Exam  Constitutional: She appears well-developed and well-nourished.  HENT:  Cerumen impaction left canal. She has very distorted right eardrum from old trauma. No cerumen right canal  Neck: Neck supple.  Cardiovascular: Normal rate.   Pulmonary/Chest: Effort normal and breath sounds normal. No respiratory distress. She has no wheezes. She has no rales.  Musculoskeletal: She exhibits no edema.          Assessment & Plan:  #1 cerumen impaction left canal. Removed with irrigation #2 dyslipidemia. Repeat lipid and hepatic panel. Continue Crestor #3 hypertension stable. Continue benazepril, amlodipine, HCTZ. Check basic metabolic panel #4 chronic kidney disease. Recheck basic chemistries

## 2013-04-05 NOTE — Progress Notes (Signed)
Pre visit review using our clinic review tool, if applicable. No additional management support is needed unless otherwise documented below in the visit note. 

## 2013-04-06 ENCOUNTER — Telehealth: Payer: Self-pay | Admitting: Family Medicine

## 2013-04-06 NOTE — Telephone Encounter (Signed)
Relevant patient education mailed to patient.  

## 2013-05-08 ENCOUNTER — Encounter: Payer: Self-pay | Admitting: Physician Assistant

## 2013-05-08 ENCOUNTER — Ambulatory Visit (INDEPENDENT_AMBULATORY_CARE_PROVIDER_SITE_OTHER): Payer: Medicare Other | Admitting: Physician Assistant

## 2013-05-08 VITALS — BP 150/60 | HR 92 | Temp 98.1°F | Resp 16 | Ht 66.0 in | Wt 160.0 lb

## 2013-05-08 DIAGNOSIS — J029 Acute pharyngitis, unspecified: Secondary | ICD-10-CM

## 2013-05-08 DIAGNOSIS — J309 Allergic rhinitis, unspecified: Secondary | ICD-10-CM

## 2013-05-08 LAB — POCT RAPID STREP A (OFFICE): RAPID STREP A SCREEN: NEGATIVE

## 2013-05-08 NOTE — Progress Notes (Signed)
Subjective:    Patient ID: Madison Hernandez, female    DOB: [birth year], 73 y.o.   MRN: [id number]  Sore Throat  This is a new problem. The current episode started more than 1 month ago. The problem has been waxing and waning. The pain is worse on the left side. There has been no fever. The pain is mild. Associated symptoms include trouble swallowing. Pertinent negatives include no abdominal pain, congestion, coughing, diarrhea, drooling, ear discharge, ear pain, headaches, hoarse voice, plugged ear sensation, neck pain, shortness of breath, stridor, swollen glands or vomiting. She has had no exposure to mono. Exposure to: Unsure if exposed to Strep. She has tried nothing for the symptoms.    Review of Systems  Constitutional: Negative for fever, chills, appetite change and unexpected weight change.  HENT: Positive for postnasal drip, sore throat and trouble swallowing. Negative for congestion, drooling, ear discharge, ear pain, hoarse voice, rhinorrhea, sinus pressure and voice change.        Dry mouth  Respiratory: Negative for cough, shortness of breath and stridor.   Gastrointestinal: Negative for vomiting, abdominal pain and diarrhea.  Musculoskeletal: Negative for neck pain.  Neurological: Negative for headaches.  All other systems reviewed and are negative.  Past Medical History  Diagnosis Date  . Bleeding ulcer     22 years ago  . Hypertension   . Dyslipidemia   . Diverticulitis   . Arthritis   . Blood in stool   . Depression   . Headache(784.0)   . Cardiac arrhythmia due to congenital heart disease   . Hyperlipidemia   . UTI (urinary tract infection)   . Acute ischemic colitis 1/12  . Migraine    Past Surgical History  Procedure Laterality Date  . Bleeding ulcers      22 years ago  . Vein procedures      right and left lower extremity vein procedures    reports that she quit smoking about 30 years ago. Her smoking use included Cigarettes. She has a 20 pack-year  smoking history. She has never used smokeless tobacco. Her alcohol and drug histories are not on file. family history includes Diabetes in her mother; Heart disease in her mother; Hypertension in her mother and sister. Allergies  Allergen Reactions  . Avelox [Moxifloxacin Hcl In Nacl] Shortness Of Breath  . Mucinex [Guaifenesin Er]     "Makes my heart flutter"       Objective:   Physical Exam  Nursing note and vitals reviewed. Constitutional: She is oriented to person, place, and time. She appears well-developed and well-nourished. No distress.  HENT:  Head: Normocephalic and atraumatic.  Right Ear: External ear normal.  Left Ear: External ear normal.  Nose: Nose normal.  Mouth/Throat: No oropharyngeal exudate.  Oropharynx is slightly erythematous, no exudate.  Eyes: Conjunctivae and EOM are normal. Pupils are equal, round, and reactive to light.  Neck: Normal range of motion. Neck supple. No tracheal deviation present. No thyromegaly present.  Cardiovascular: Normal rate, regular rhythm, normal heart sounds and intact distal pulses.  Exam reveals no gallop and no friction rub.   No murmur heard. Pulmonary/Chest: Effort normal and breath sounds normal. No stridor. No respiratory distress. She has no wheezes. She has no rales. She exhibits no tenderness.  Abdominal: Soft. Bowel sounds are normal. There is no tenderness.  Musculoskeletal: Normal range of motion. She exhibits no edema.  Lymphadenopathy:    She has no cervical adenopathy.  Neurological: She is alert and  oriented to person, place, and time. She has normal reflexes. No cranial nerve deficit. Coordination normal.  Skin: Skin is warm and dry. No rash noted. She is not diaphoretic. No erythema. No pallor.  Psychiatric: She has a normal mood and affect. Her behavior is normal. Judgment and thought content normal.   Filed Vitals:   05/08/13 1302  BP: 150/60  Pulse: 92  Temp: 98.1 F (36.7 C)  Resp: 16   Lab Results    Component Value Date   WBC 6.2 08/25/2010   HGB 12.3 08/25/2010   HCT 36.7 08/25/2010   PLT 205.0 08/25/2010   GLUCOSE 113* 04/05/2013   CHOL 231* 04/05/2013   TRIG 124.0 04/05/2013   HDL 64.50 04/05/2013   LDLDIRECT 228.2 07/08/2010   LDLCALC 142* 04/05/2013   ALT 26 04/05/2013   AST 23 04/05/2013   NA 138 04/05/2013   K 4.3 04/05/2013   CL 100 04/05/2013   CREATININE 1.2 04/05/2013   BUN 30* 04/05/2013   CO2 26 04/05/2013   TSH 1.36 03/10/2012   INR 1.02 01/22/2010   HGBA1C 6.6* 04/05/2013      Assessment & Plan:  May was seen today for sore throat.  Diagnoses and associated orders for this visit:  Acute pharyngitis - POC Rapid Strep A - negative  Allergic rhinitis    Patient Instructions  Saltwater gargles for symptom relief.  Saline spray and sinus rinses as tolerated to thin mucus secretions.  Over-the-counter sore throat lozenges.  Force NON Hydrologist).    OTC Flonase OR Nasacort AQ 1 spray in each nostril twice a day as needed. Use the "crossover" technique into opposite nostril spraying toward opposite ear @ 45 degree angle, not straight up into nostril.   Plain OTC Allegra (NOT D )  160 daily , OTC Loratidine 10 mg , OR OTC Zyrtec 10 mg @ bedtime  as needed for itchy eyes & sneezing.   Follow up is symptoms worsen or do not improve despite treatment.

## 2013-05-08 NOTE — Progress Notes (Signed)
Pre visit review using our clinic review tool, if applicable. No additional management support is needed unless otherwise documented below in the visit note. 

## 2013-05-08 NOTE — Patient Instructions (Signed)
Saltwater gargles for symptom relief.  Saline spray and sinus rinses as tolerated to thin mucus secretions.  Over-the-counter sore throat lozenges.  Force NON Hydrologist).    OTC Flonase OR Nasacort AQ 1 spray in each nostril twice a day as needed. Use the "crossover" technique into opposite nostril spraying toward opposite ear @ 45 degree angle, not straight up into nostril.   Plain OTC Allegra (NOT D )  160 daily , OTC Loratidine 10 mg , OR OTC Zyrtec 10 mg @ bedtime  as needed for itchy eyes & sneezing.   Follow up is symptoms worsen or do not improve despite treatment.  Pharyngitis Pharyngitis is a sore throat (pharynx). There is redness, pain, and swelling of your throat. HOME CARE   Drink enough fluids to keep your pee (urine) clear or pale yellow.  Only take medicine as told by your doctor.  You may get sick again if you do not take medicine as told. Finish your medicines, even if you start to feel better.  Do not take aspirin.  Rest.  Rinse your mouth (gargle) with salt water ( tsp of salt per 1 qt of water) every 1 2 hours. This will help the pain.  If you are not at risk for choking, you can suck on hard candy or sore throat lozenges. GET HELP IF:  You have large, tender lumps on your neck.  You have a rash.  You cough up green, yellow-brown, or bloody spit. GET HELP RIGHT AWAY IF:   You have a stiff neck.  You drool or cannot swallow liquids.  You throw up (vomit) or are not able to keep medicine or liquids down.  You have very bad pain that does not go away with medicine.  You have problems breathing (not from a stuffy nose). MAKE SURE YOU:   Understand these instructions.  Will watch your condition.  Will get help right away if you are not doing well or get worse. Document Released: 06/23/2007 Document Revised: 10/25/2012 Document Reviewed: 09/11/2012 [facility] Patient Information 2014 [place].

## 2013-05-14 ENCOUNTER — Encounter: Payer: Self-pay | Admitting: Family Medicine

## 2013-05-14 ENCOUNTER — Ambulatory Visit (INDEPENDENT_AMBULATORY_CARE_PROVIDER_SITE_OTHER): Payer: Medicare Other | Admitting: Family Medicine

## 2013-05-14 ENCOUNTER — Telehealth: Payer: Self-pay | Admitting: Family Medicine

## 2013-05-14 VITALS — BP 136/66 | HR 94 | Wt 160.0 lb

## 2013-05-14 DIAGNOSIS — M545 Low back pain, unspecified: Secondary | ICD-10-CM | POA: Diagnosis not present

## 2013-05-14 MED ORDER — CELECOXIB 200 MG PO CAPS: 200.0000 mg | ORAL_CAPSULE | Freq: Every day | ORAL | Status: DC | PRN

## 2013-05-14 NOTE — Telephone Encounter (Signed)
Pt is on celebrex and would like to try  tramadol  Instead of oxycodone. Pt was seen today.harris Scientist, product/process development

## 2013-05-14 NOTE — Telephone Encounter (Signed)
We just prescribed the Celebrex today.  Did she not get filled?

## 2013-05-14 NOTE — Progress Notes (Signed)
Pre visit review using our clinic review tool, if applicable. No additional management support is needed unless otherwise documented below in the visit note. 

## 2013-05-14 NOTE — Telephone Encounter (Signed)
Pt thought that the medication was going to relieve the pain instantly. Pt did get the Rx filled today.

## 2013-05-14 NOTE — Telephone Encounter (Signed)
Pt informed

## 2013-05-14 NOTE — Telephone Encounter (Signed)
NO.  Give this more time.

## 2013-05-14 NOTE — Progress Notes (Signed)
Subjective:    Patient ID: Madison Hernandez, female    DOB: [birth year], 73 y.o.   MRN: [id number]  HPI Comments: Patient is a 73 year-old female presenting with complaints of back pain. Patient states she was picking up heavy boxes Saturday evening and the pain started Sunday afternoon while at rest. The burning and stabbing pain has been constant since then. Patient states pain is bilateral across her lumbar region and does not radiate. She has a history of chronic thoracic backaches, no surgery. Movement exacerbates the pain Tried 3 alleves and alternates ice and heat. No relief. She thinks she may need Celebrex. She also states she has used hydrocodone in the past which has helped her thoracic back pain. Patient endorses chills and history of recent cold for which she is taking Zyrtec and Nasacort. She is also gargling salt water. These remedies seem to help. She denies fever, headache, dizziness, shortness of breath, cough, chest pain, abdominal pain, difficulty urinating, dysuria, or recent weight loss.     Back Pain The pain is present in the lumbar spine. The quality of the pain is described as stabbing and burning. The pain is the same all the time. The symptoms are aggravated by bending, coughing and twisting. Pertinent negatives include no abdominal pain, chest pain, dysuria, fever, headaches, leg pain, numbness, paresthesias, tingling, weakness or weight loss. Risk factors include recent trauma and history of cancer. She has tried ice, heat and NSAIDs for the symptoms. The treatment provided no relief.      Review of Systems  Constitutional: Negative for fever and weight loss.  Respiratory: Negative for cough and shortness of breath.   Cardiovascular: Negative for chest pain and palpitations.  Gastrointestinal: Negative for abdominal pain.  Genitourinary: Negative for dysuria and difficulty urinating.  Musculoskeletal: Positive for back pain.  Neurological: Negative for dizziness,  tingling, weakness, numbness, headaches and paresthesias.       Objective:   Physical Exam  Constitutional: She appears well-developed and well-nourished.  HENT:  Head: Normocephalic.  Eyes: Pupils are equal, round, and reactive to light.  Neck: Normal range of motion.  Cardiovascular: Intact distal pulses.   Musculoskeletal: Normal range of motion.       Right hip: She exhibits normal range of motion, normal strength and no tenderness.       Left hip: She exhibits normal range of motion, normal strength and no tenderness.       Right knee: She exhibits normal range of motion. No tenderness found.       Left knee: She exhibits normal range of motion. No tenderness found.       Right ankle: She exhibits normal range of motion. No tenderness.       Lumbar back: She exhibits tenderness and bony tenderness.  Bilateral lumbar pain to palpation in bandlike distribution including spine and muscles.   Neurological: She is alert. She has normal strength. Coordination and gait normal.  Reflex Scores:      Patellar reflexes are 2+ on the right side and 1+ on the left side.      Achilles reflexes are 1+ on the right side and 1+ on the left side. Skin: Skin is warm and dry.          Assessment & Plan:  1. Lumbar strain Prescribed Celebrex 200mg  and recommended alternating heat and ice to manage pain. Recommended proper lifting mechanics and gave educational handout on lumbosacral strain.   [place], PA-S  As above.  Suspect muscular strain.  nonfocal exam.  Patient given handout and parameters for follow up.  Carolann Littler, MD

## 2013-05-14 NOTE — Patient Instructions (Signed)
Lumbosacral Strain Lumbosacral strain is a strain of any of the parts that make up your lumbosacral vertebrae. Your lumbosacral vertebrae are the bones that make up the lower third of your backbone. Your lumbosacral vertebrae are held together by muscles and tough, fibrous tissue (ligaments).  CAUSES  A sudden blow to your back can cause lumbosacral strain. Also, anything that causes an excessive stretch of the muscles in the low back can cause this strain. This is typically seen when people exert themselves strenuously, fall, lift heavy objects, bend, or crouch repeatedly. RISK FACTORS  Physically demanding work.  Participation in pushing or pulling sports or sports that require sudden twist of the back (tennis, golf, baseball).  Weight lifting.  Excessive lower back curvature.  Forward-tilted pelvis.  Weak back or abdominal muscles or both.  Tight hamstrings. SIGNS AND SYMPTOMS  Lumbosacral strain may cause pain in the area of your injury or pain that moves (radiates) down your leg.  DIAGNOSIS Your health care provider can often diagnose lumbosacral strain through a physical exam. In some cases, you may need tests such as X-ray exams.  TREATMENT  Treatment for your lower back injury depends on many factors that your clinician will have to evaluate. However, most treatment will include the use of anti-inflammatory medicines. HOME CARE INSTRUCTIONS   Avoid hard physical activities (tennis, racquetball, waterskiing) if you are not in proper physical condition for it. This may aggravate or create problems.  If you have a back problem, avoid sports requiring sudden body movements. Swimming and walking are generally safer activities.  Maintain good posture.  Maintain a healthy weight.  For acute conditions, you may put ice on the injured area.  Put ice in a plastic bag.  Place a towel between your skin and the bag.  Leave the ice on for 20 minutes, 2 3 times a day.  When the  low back starts healing, stretching and strengthening exercises may be recommended. SEEK MEDICAL CARE IF:  Your back pain is getting worse.  You experience severe back pain not relieved with medicines. SEEK IMMEDIATE MEDICAL CARE IF:   You have numbness, tingling, weakness, or problems with the use of your arms or legs.  There is a change in bowel or bladder control.  You have increasing pain in any area of the body, including your belly (abdomen).  You notice shortness of breath, dizziness, or feel faint.  You feel sick to your stomach (nauseous), are throwing up (vomiting), or become sweaty.  You notice discoloration of your toes or legs, or your feet get very cold. MAKE SURE YOU:   Understand these instructions.  Will watch your condition.  Will get help right away if you are not doing well or get worse. Document Released: 10/14/2004 Document Revised: 10/25/2012 Document Reviewed: 08/23/2012 [employer] Patient Information 2014 [employer].  

## 2013-05-16 ENCOUNTER — Other Ambulatory Visit: Payer: Self-pay

## 2013-05-16 MED ORDER — MELOXICAM 15 MG PO TABS: 15.0000 mg | ORAL_TABLET | Freq: Every day | ORAL | Status: DC

## 2013-06-05 DIAGNOSIS — M79609 Pain in unspecified limb: Secondary | ICD-10-CM | POA: Diagnosis not present

## 2013-06-12 ENCOUNTER — Other Ambulatory Visit: Payer: Self-pay | Admitting: Cardiovascular Disease

## 2013-06-12 DIAGNOSIS — I872 Venous insufficiency (chronic) (peripheral): Secondary | ICD-10-CM | POA: Diagnosis not present

## 2013-06-18 ENCOUNTER — Other Ambulatory Visit: Payer: Self-pay | Admitting: Cardiovascular Disease

## 2013-06-19 ENCOUNTER — Telehealth: Payer: Self-pay | Admitting: Family Medicine

## 2013-06-19 MED ORDER — MELOXICAM 15 MG PO TABS: 15.0000 mg | ORAL_TABLET | Freq: Every day | ORAL | Status: DC

## 2013-06-19 NOTE — Telephone Encounter (Signed)
I called patient about refilling 3 of her cardiac medication, amlodipine, lotensin, crestor, she states Dr Elease Hashimoto has been filling these medications and she just recently received supplies, she was able to call these 3 medicines as recently being filled. She has not been here to see Dr Johnsie Cancel since 2013.

## 2013-06-19 NOTE — Telephone Encounter (Signed)
Rx sent to mail order

## 2013-06-19 NOTE — Telephone Encounter (Signed)
[facility] is requesting re-fill on meloxicam (MOBIC) 15 MG tablet

## 2013-06-29 ENCOUNTER — Other Ambulatory Visit: Payer: Self-pay | Admitting: Cardiovascular Disease

## 2013-07-03 ENCOUNTER — Telehealth: Payer: Self-pay | Admitting: Family Medicine

## 2013-07-03 MED ORDER — ROSUVASTATIN CALCIUM 10 MG PO TABS: 5.0000 mg | ORAL_TABLET | Freq: Every day | ORAL | Status: DC

## 2013-07-03 MED ORDER — AMLODIPINE BESYLATE 10 MG PO TABS: 10.0000 mg | ORAL_TABLET | Freq: Every day | ORAL | Status: DC

## 2013-07-03 MED ORDER — BENAZEPRIL HCL 20 MG PO TABS: ORAL_TABLET | ORAL | Status: DC

## 2013-07-03 NOTE — Telephone Encounter (Signed)
Rx sent to pharmacy   

## 2013-07-03 NOTE — Telephone Encounter (Signed)
Pt states [facility] sent her a letter stating she will need new scripts for her meds. amLODipine (NORVASC) 10 MG tablet 90 day benazepril (LOTENSIN) 20 MG tablet  90 day rosuvastatin (CRESTOR) 10 MG tablet  90 day Pt would like to know if she needs to sch an appt for her refills.

## 2013-09-22 DIAGNOSIS — Z23 Encounter for immunization: Secondary | ICD-10-CM | POA: Diagnosis not present

## 2013-11-06 ENCOUNTER — Encounter: Payer: Self-pay | Admitting: Family Medicine

## 2013-11-06 ENCOUNTER — Ambulatory Visit (INDEPENDENT_AMBULATORY_CARE_PROVIDER_SITE_OTHER): Payer: Medicare Other | Admitting: Family Medicine

## 2013-11-06 ENCOUNTER — Telehealth: Payer: Self-pay | Admitting: Family Medicine

## 2013-11-06 VITALS — BP 132/68 | HR 90 | Temp 97.7°F | Wt 163.0 lb

## 2013-11-06 DIAGNOSIS — Z23 Encounter for immunization: Secondary | ICD-10-CM

## 2013-11-06 DIAGNOSIS — M150 Primary generalized (osteo)arthritis: Secondary | ICD-10-CM

## 2013-11-06 DIAGNOSIS — I10 Essential (primary) hypertension: Secondary | ICD-10-CM

## 2013-11-06 DIAGNOSIS — E782 Mixed hyperlipidemia: Secondary | ICD-10-CM

## 2013-11-06 DIAGNOSIS — M159 Polyosteoarthritis, unspecified: Secondary | ICD-10-CM | POA: Insufficient documentation

## 2013-11-06 LAB — HEPATIC FUNCTION PANEL
ALBUMIN: 3.9 g/dL (ref 3.5–5.2)
ALK PHOS: 65 U/L (ref 39–117)
ALT: 27 U/L (ref 0–35)
AST: 26 U/L (ref 0–37)
BILIRUBIN TOTAL: 0.4 mg/dL (ref 0.2–1.2)
Bilirubin, Direct: 0 mg/dL (ref 0.0–0.3)
Total Protein: 8 g/dL (ref 6.0–8.3)

## 2013-11-06 LAB — BASIC METABOLIC PANEL
BUN: 29 mg/dL — ABNORMAL HIGH (ref 6–23)
CO2: 23 meq/L (ref 19–32)
CREATININE: 1.2 mg/dL (ref 0.4–1.2)
Calcium: 9.8 mg/dL (ref 8.4–10.5)
Chloride: 102 mEq/L (ref 96–112)
GFR: 48.85 mL/min — ABNORMAL LOW (ref >60.00)
GLUCOSE: 112 mg/dL — AB (ref 70–99)
Potassium: 4.2 mEq/L (ref 3.5–5.1)
Sodium: 139 mEq/L (ref 135–145)

## 2013-11-06 LAB — LIPID PANEL
CHOL/HDL RATIO: 5
Cholesterol: 262 mg/dL — ABNORMAL HIGH (ref 0–200)
HDL: 56.7 mg/dL (ref >39.00)
NONHDL: 205.3
Triglycerides: 313 mg/dL — ABNORMAL HIGH (ref 0.0–149.0)
VLDL: 62.6 mg/dL — ABNORMAL HIGH (ref 0.0–40.0)

## 2013-11-06 LAB — LDL CHOLESTEROL, DIRECT: Direct LDL: 169.4 mg/dL

## 2013-11-06 MED ORDER — ROSUVASTATIN CALCIUM 10 MG PO TABS: 5.0000 mg | ORAL_TABLET | Freq: Every day | ORAL | Status: DC

## 2013-11-06 MED ORDER — MELOXICAM 15 MG PO TABS: 15.0000 mg | ORAL_TABLET | Freq: Every day | ORAL | Status: DC

## 2013-11-06 NOTE — Progress Notes (Signed)
   Subjective:    Patient ID: Madison Hernandez, female    DOB: 1940/05/17, 73 y.o.   MRN: [id number]  HPI  Patient seen for routine medical followup. Her chronic problems include history of hyperlipidemia, migraine headaches, hypertension, stage I chronic kidney disease, and remote history of acute ischemic colitis. She feels well overall. Blood pressure stable. She has not been consistently taking amlodipine but blood pressures have been stable with HCTZ and enalapril. Compliant with other medications. Takes Crestor low dosage for hyperlipidemia. Her weight has increased back up somewhat over the past year. No consistent exercise.  She has multiple arthritic complaints. She has some plantar fasciitis but also arthritis in her wrist and knees. No history of any inflammatory changes. She takes meloxicam as needed in this is working well for her in the past. She doesn't take this consistently on daily basis.  She has already had flu vaccine. Needs Prevnar 13. She thinks she's had previous shingles vaccine.   Past Medical History  Diagnosis Date  . Bleeding ulcer     22 years ago  . Hypertension   . Dyslipidemia   . Diverticulitis   . Arthritis   . Blood in stool   . Depression   . Headache(784.0)   . Cardiac arrhythmia due to congenital heart disease   . Hyperlipidemia   . UTI (urinary tract infection)   . Acute ischemic colitis 1/12  . Migraine    Past Surgical History  Procedure Laterality Date  . Bleeding ulcers      22 years ago  . Vein procedures      right and left lower extremity vein procedures    reports that she quit smoking about 30 years ago. Her smoking use included Cigarettes. She has a 20 pack-year smoking history. She has never used smokeless tobacco. Her alcohol and drug histories are not on file. family history includes Diabetes in her mother; Heart disease in her mother; Hypertension in her mother and sister. Allergies  Allergen Reactions  . Avelox  [Moxifloxacin Hcl In Nacl] Shortness Of Breath  . Mucinex [Guaifenesin Er]     "Makes my heart flutter"      Review of Systems  Constitutional: Negative for fever, chills, fatigue and unexpected weight change.  Eyes: Negative for visual disturbance.  Respiratory: Negative for cough, chest tightness, shortness of breath and wheezing.   Cardiovascular: Negative for chest pain, palpitations and leg swelling.  Gastrointestinal: Negative for abdominal pain.  Genitourinary: Negative for dysuria.  Neurological: Negative for dizziness, seizures, syncope, weakness, light-headedness and headaches.       Objective:   Physical Exam  Constitutional: She appears well-developed and well-nourished.  Neck: Neck supple. No thyromegaly present.  Cardiovascular: Normal rate and regular rhythm.   Pulmonary/Chest: Effort normal and breath sounds normal. No respiratory distress. She has no wheezes. She has no rales.  Musculoskeletal: She exhibits no edema.  Patient has support hose in place bilaterally          Assessment & Plan:  #1 hypertension which is stable. Check basic metabolic panel. Continue current medications #2 hyperlipidemia. Repeat lipid panel. Consider further titration of Crestor not to goal #3 osteoarthritis involving multiple joints. Refill meloxicam. She is advised to try Tylenol first line treatment and avoid daily use of blocks can possible  #4 health maintenance. Flu vaccine are given. Prevnar 13 given.

## 2013-11-06 NOTE — Progress Notes (Signed)
Pre visit review using our clinic review tool, if applicable. No additional management support is needed unless otherwise documented below in the visit note. 

## 2013-11-07 ENCOUNTER — Other Ambulatory Visit: Payer: Self-pay | Admitting: Cardiovascular Disease

## 2013-11-07 ENCOUNTER — Other Ambulatory Visit: Payer: Self-pay

## 2013-11-07 NOTE — Telephone Encounter (Signed)
Pt notified of labs

## 2013-11-12 ENCOUNTER — Telehealth: Payer: Self-pay | Admitting: Family Medicine

## 2013-11-12 MED ORDER — ROSUVASTATIN CALCIUM 10 MG PO TABS: 10.0000 mg | ORAL_TABLET | Freq: Every day | ORAL | Status: DC

## 2013-11-12 NOTE — Telephone Encounter (Signed)
Patient says [facility] called her and said that we denied her Rx for Crestor 10mg  but that Dr Sheldon Silvan told he to take the Crestor 10mg . Please call her and or [facility] to clarify.

## 2013-11-12 NOTE — Telephone Encounter (Signed)
Rx sent to mail order

## 2014-03-20 ENCOUNTER — Encounter (HOSPITAL_COMMUNITY): Payer: Self-pay

## 2014-03-20 ENCOUNTER — Ambulatory Visit: Payer: Medicare Other | Admitting: Family Medicine

## 2014-03-20 ENCOUNTER — Emergency Department (HOSPITAL_COMMUNITY)
Admission: EM | Admit: 2014-03-20 | Discharge: 2014-03-20 | Disposition: A | Discharge status: Home / Self Care | Payer: Medicare Other | Attending: Emergency Medicine | Admitting: Emergency Medicine

## 2014-03-20 DIAGNOSIS — Z8719 Personal history of other diseases of the digestive system: Secondary | ICD-10-CM | POA: Diagnosis not present

## 2014-03-20 DIAGNOSIS — M62838 Other muscle spasm: Secondary | ICD-10-CM

## 2014-03-20 DIAGNOSIS — Q248 Other specified congenital malformations of heart: Secondary | ICD-10-CM | POA: Diagnosis not present

## 2014-03-20 DIAGNOSIS — G43909 Migraine, unspecified, not intractable, without status migrainosus: Secondary | ICD-10-CM | POA: Insufficient documentation

## 2014-03-20 DIAGNOSIS — M545 Low back pain: Secondary | ICD-10-CM | POA: Insufficient documentation

## 2014-03-20 DIAGNOSIS — M2550 Pain in unspecified joint: Secondary | ICD-10-CM | POA: Diagnosis not present

## 2014-03-20 DIAGNOSIS — F329 Major depressive disorder, single episode, unspecified: Secondary | ICD-10-CM | POA: Insufficient documentation

## 2014-03-20 DIAGNOSIS — M1990 Unspecified osteoarthritis, unspecified site: Secondary | ICD-10-CM | POA: Diagnosis not present

## 2014-03-20 DIAGNOSIS — M549 Dorsalgia, unspecified: Secondary | ICD-10-CM

## 2014-03-20 DIAGNOSIS — E785 Hyperlipidemia, unspecified: Secondary | ICD-10-CM | POA: Insufficient documentation

## 2014-03-20 DIAGNOSIS — Z791 Long term (current) use of non-steroidal anti-inflammatories (NSAID): Secondary | ICD-10-CM | POA: Diagnosis not present

## 2014-03-20 DIAGNOSIS — Z87440 Personal history of urinary (tract) infections: Secondary | ICD-10-CM | POA: Diagnosis not present

## 2014-03-20 DIAGNOSIS — M62830 Muscle spasm of back: Secondary | ICD-10-CM | POA: Insufficient documentation

## 2014-03-20 DIAGNOSIS — I10 Essential (primary) hypertension: Secondary | ICD-10-CM | POA: Diagnosis not present

## 2014-03-20 DIAGNOSIS — Z79899 Other long term (current) drug therapy: Secondary | ICD-10-CM | POA: Insufficient documentation

## 2014-03-20 DIAGNOSIS — Z87891 Personal history of nicotine dependence: Secondary | ICD-10-CM | POA: Insufficient documentation

## 2014-03-20 MED ORDER — HYDROCODONE-ACETAMINOPHEN 5-325 MG PO TABS: 1.0000 | ORAL_TABLET | Freq: Four times a day (QID) | ORAL | Status: DC | PRN

## 2014-03-20 NOTE — Discharge Instructions (Signed)
Back Pain, Adult °Low back pain is very common. About 1 in 5 people have back pain. The cause of low back pain is rarely dangerous. The pain often gets better over time. About half of people with a sudden onset of back pain feel better in just 2 weeks. About 8 in 10 people feel better by 6 weeks.  °CAUSES °Some common causes of back pain include: °· Strain of the muscles or ligaments supporting the spine. °· Wear and tear (degeneration) of the spinal discs. °· Arthritis. °· Direct injury to the back. °DIAGNOSIS °Most of the time, the direct cause of low back pain is not known. However, back pain can be treated effectively even when the exact cause of the pain is unknown. Answering your caregiver's questions about your overall health and symptoms is one of the most accurate ways to make sure the cause of your pain is not dangerous. If your caregiver needs more information, he or she may order lab work or imaging tests (X-rays or MRIs). However, even if imaging tests show changes in your back, this usually does not require surgery. °HOME CARE INSTRUCTIONS °For many people, back pain returns. Since low back pain is rarely dangerous, it is often a condition that people can learn to manage on their own.  °· Remain active. It is stressful on the back to sit or stand in one place. Do not sit, drive, or stand in one place for more than 30 minutes at a time. Take short walks on level surfaces as soon as pain allows. Try to increase the length of time you walk each day. °· Do not stay in bed. Resting more than 1 or 2 days can delay your recovery. °· Do not avoid exercise or work. Your body is made to move. It is not dangerous to be active, even though your back may hurt. Your back will likely heal faster if you return to being active before your pain is gone. °· Pay attention to your body when you  bend and lift. Many people have less discomfort when lifting if they bend their knees, keep the load close to their bodies, and  avoid twisting. Often, the most comfortable positions are those that put less stress on your recovering back. °· Find a comfortable position to sleep. Use a firm mattress and lie on your side with your knees slightly bent. If you lie on your back, put a pillow under your knees. °· Only take over-the-counter or prescription medicines as directed by your caregiver. Over-the-counter medicines to reduce pain and inflammation are often the most helpful. Your caregiver may prescribe muscle relaxant drugs. These medicines help dull your pain so you can more quickly return to your normal activities and healthy exercise. °· Put ice on the injured area. °· Put ice in a plastic bag. °· Place a towel between your skin and the bag. °· Leave the ice on for 15-20 minutes, 03-04 times a day for the first 2 to 3 days. After that, ice and heat may be alternated to reduce pain and spasms. °· Ask your caregiver about trying back exercises and gentle massage. This may be of some benefit. °· Avoid feeling anxious or stressed. Stress increases muscle tension and can worsen back pain. It is important to recognize when you are anxious or stressed and learn ways to manage it. Exercise is a great option. °SEEK MEDICAL CARE IF: °· You have pain that is not relieved with rest or medicine. °· You have pain that does not improve in 1 week. °· You have new symptoms. °· You are generally not feeling well. °SEEK   IMMEDIATE MEDICAL CARE IF:  °· You have pain that radiates from your back into your legs. °· You develop new bowel or bladder control problems. °· You have unusual weakness or numbness in your arms or legs. °· You develop nausea or vomiting. °· You develop abdominal pain. °· You feel faint. °Document Released: 01/04/2005 Document Revised: 07/06/2011 Document Reviewed: 05/08/2013 °[employer]® Patient Information ©2015 [employer]. This information is not intended to replace advice given to you by your health care provider. Make sure you  discuss any questions you have with your health care provider. ° °Muscle Cramps and Spasms °Muscle cramps and spasms occur when a muscle or muscles tighten and you have no control over this tightening (involuntary muscle contraction). They are a common problem and can develop in any muscle. The most common place is in the calf muscles of the leg. Both muscle cramps and muscle spasms are involuntary muscle contractions, but they also have differences:  °· Muscle cramps are sporadic and painful. They may last a few seconds to a quarter of an hour. Muscle cramps are often more forceful and last longer than muscle spasms. °· Muscle spasms may or may not be painful. They may also last just a few seconds or much longer. °CAUSES  °It is uncommon for cramps or spasms to be due to a serious underlying problem. In many cases, the cause of cramps or spasms is unknown. Some common causes are:  °· Overexertion.   °· Overuse from repetitive motions (doing the same thing over and over).   °· Remaining in a certain position for a long period of time.   °· Improper preparation, form, or technique while performing a sport or activity.   °· Dehydration.   °· Injury.   °· Side effects of some medicines.   °· Abnormally low levels of the salts and ions in your blood (electrolytes), especially potassium and calcium. This could happen if you are taking water pills (diuretics) or you are pregnant.   °Some underlying medical problems can make it more likely to develop cramps or spasms. These include, but are not limited to:  °· Diabetes.   °· Parkinson disease.   °· Hormone disorders, such as thyroid problems.   °· Alcohol abuse.   °· Diseases specific to muscles, joints, and bones.   °· Blood vessel disease where not enough blood is getting to the muscles.   °HOME CARE INSTRUCTIONS  °· Stay well hydrated. Drink enough water and fluids to keep your urine clear or pale yellow. °· It may be helpful to massage, stretch, and relax the affected  muscle. °· For tight or tense muscles, use a warm towel, heating pad, or hot shower water directed to the affected area. °· If you are sore or have pain after a cramp or spasm, applying ice to the affected area may relieve discomfort. °¨ Put ice in a plastic bag. °¨ Place a towel between your skin and the bag. °¨ Leave the ice on for 15-20 minutes, 03-04 times a day. °· Medicines used to treat a known cause of cramps or spasms may help reduce their frequency or severity. Only take over-the-counter or prescription medicines as directed by your caregiver. °SEEK MEDICAL CARE IF:  °Your cramps or spasms get more severe, more frequent, or do not improve over time.  °MAKE SURE YOU:  °· Understand these instructions. °· Will watch your condition. °· Will get help right away if you are not doing well or get worse. °Document Released: 06/26/2001 Document Revised: 05/01/2012 Document Reviewed: 12/22/2011 °ExitCare® Patient Information ©2015 [employer].   This information is not intended to replace advice given to you by your health care provider. Make sure you discuss any questions you have with your health care provider. ° °

## 2014-03-20 NOTE — ED Provider Notes (Signed)
The patient is a 74 year old female who presents with right lower and mid back pain which she woke up with yesterday after having helped somebody move the night before. This pain is sharp, stays in one location without radiation and is not associated with difficulty breathing chest pain swelling of the legs numbness or weakness or difficulty urinating. She has no pathologic red flags for back pain except for her age, on exam she has tenderness over the right area, it is very minimal, there is normal sensation and strength of the lower extremities, normal gait, her pain is minimal at this time. She is stable for discharge without advanced imaging, she will follow-up in the outpatient setting.  Medical screening examination/treatment/procedure(s) were conducted as a shared visit with non-physician practitioner(s) and myself.  I personally evaluated the patient during the encounter.  Clinical Impression:   Final diagnoses:  Back pain, unspecified location  Muscle spasm         Johnna Acosta, MD 03/20/14 2117

## 2014-03-20 NOTE — ED Notes (Signed)
Pt with back pain x 2 days.  Recent movement of furniture.  No change in urination or increased frequency.

## 2014-03-20 NOTE — ED Provider Notes (Signed)
CSN: [id number]     Arrival date & time 03/20/14  0825 History   First MD Initiated Contact with Patient 03/20/14 [phone]     Chief Complaint  Patient presents with  . Back Pain     (Consider location/radiation/quality/duration/timing/severity/associated sxs/prior Treatment) HPI Comments: Patient presents to the emergency department with chief complaint of low back pain. She states that she was moving some furniture 2 days ago. She states that she has had back pain since moving the furniture. She states the pain is sharp and stabbing. She denies any numbness, weakness, or radiating symptoms. She states the pain is worsened with movement. She states that she took tramadol last night which has helped significantly. She denies any bowel or bladder incontinence. As a fevers chills. Denies any history of back surgery.  The history is provided by the patient. No language interpreter was used.    Past Medical History  Diagnosis Date  . Bleeding ulcer     22 years ago  . Hypertension   . Dyslipidemia   . Diverticulitis   . Arthritis   . Blood in stool   . Depression   . Headache(784.0)   . Cardiac arrhythmia due to congenital heart disease   . Hyperlipidemia   . UTI (urinary tract infection)   . Acute ischemic colitis 1/12  . Migraine    Past Surgical History  Procedure Laterality Date  . Bleeding ulcers      22 years ago  . Vein procedures      right and left lower extremity vein procedures   Family History  Problem Relation Age of Onset  . Heart disease Mother     age 26  . Diabetes Mother   . Hypertension Mother   . Hypertension Sister    History  Substance Use Topics  . Smoking status: Former Smoker -- 2.00 packs/day for 10 years    Types: Cigarettes    Quit date: 01/19/1983  . Smokeless tobacco: Never Used  . Alcohol Use: No   OB History    No data available     Review of Systems  Constitutional: Negative for fever and chills.  Gastrointestinal:       No bowel  incontinence  Genitourinary:       No urinary incontinence  Musculoskeletal: Positive for myalgias, back pain and arthralgias.  Neurological:       No saddle anesthesia  All other systems reviewed and are negative.     Allergies  Avelox and Mucinex  Home Medications   Prior to Admission medications   Medication Sig Start Date End Date Taking? Authorizing Provider  amitriptyline (ELAVIL) 25 MG tablet Take 25 mg by mouth at bedtime.   Yes Historical Provider, MD  amLODipine (NORVASC) 10 MG tablet Take 10 mg by mouth daily. 01/30/14  Yes Historical Provider, MD  benazepril (LOTENSIN) 20 MG tablet Take 1 tablet by mouth  daily Patient taking differently: Take 20 mg by mouth daily. Take 1 tablet by mouth  daily 07/03/13  Yes Eulas Post, MD  meloxicam (MOBIC) 15 MG tablet Take 1 tablet (15 mg total) by mouth daily. Generic only 11/06/13  Yes Eulas Post, MD  naproxen sodium (ANAPROX) 220 MG tablet Take 440 mg by mouth every 6 (six) hours as needed (for pain).    Yes Historical Provider, MD  rosuvastatin (CRESTOR) 10 MG tablet Take 1 tablet (10 mg total) by mouth daily. 11/12/13  Yes Eulas Post, MD  hydrochlorothiazide (HYDRODIURIL) 25 MG  tablet Take one daily    Josue Hector, MD  HYDROcodone-acetaminophen (NORCO/VICODIN) 5-325 MG per tablet Take 1-2 tablets by mouth every 6 (six) hours as needed. 03/20/14   Montine Circle, PA-C  oxyCODONE-acetaminophen (PERCOCET/ROXICET) 5-325 MG per tablet Take 1 tablet by mouth every 6 (six) hours as needed. For pain. 03/09/13   Eulas Post, MD   BP 166/72 mmHg  Pulse 107  Temp(Src) 97.7 F (36.5 C) (Oral)  Resp 20  SpO2 100% Physical Exam  Constitutional: She is oriented to person, place, and time. She appears well-developed and well-nourished. No distress.  HENT:  Head: Normocephalic and atraumatic.  Eyes: Conjunctivae and EOM are normal. Right eye exhibits no discharge. Left eye exhibits no discharge. No scleral icterus.   Neck: Normal range of motion. Neck supple. No tracheal deviation present.  Cardiovascular: Normal rate, regular rhythm and normal heart sounds.  Exam reveals no gallop and no friction rub.   No murmur heard. Pulmonary/Chest: Effort normal and breath sounds normal. No respiratory distress. She has no wheezes.  Abdominal: Soft. She exhibits no distension. There is no tenderness.  Musculoskeletal: Normal range of motion.  Right lumbar paraspinal muscles tender to palpation, no bony tenderness, step-offs, or gross abnormality or deformity of spine, patient is able to ambulate, moves all extremities  Bilateral great toe extension intact Bilateral plantar/dorsiflexion intact  Normal heel/toe walking  Neurological: She is alert and oriented to person, place, and time. She has normal reflexes.  Sensation and strength intact bilaterally Symmetrical reflexes  Skin: Skin is warm. She is not diaphoretic.  Psychiatric: She has a normal mood and affect. Her behavior is normal. Judgment and thought content normal.  Nursing note and vitals reviewed.   ED Course  Procedures (including critical care time) Labs Review Labs Reviewed - No data to display  Imaging Review No results found.   EKG Interpretation None      MDM   Final diagnoses:  Back pain, unspecified location  Muscle spasm   Patient with back pain.  No neurological deficits and normal neuro exam.  Patient is ambulatory.  No loss of bowel or bladder control.  Doubt cauda equina.  Denies fever,  doubt epidural abscess or other lesion. Recommend back exercises, stretching, RICE, and will treat with a short course of hydrocodone.  Encouraged the patient that there could be a need for additional workup and/or imaging such as MRI, if the symptoms do not resolve. Patient advised that if the back pain does not resolve, or radiates, this could progress to more serious conditions and is encouraged to follow-up with PCP or orthopedics within  2 weeks.       Montine Circle, PA-C 03/20/14 0100  Johnna Acosta, MD 03/20/14 2116

## 2014-03-21 ENCOUNTER — Encounter: Payer: Self-pay | Admitting: Family Medicine

## 2014-03-21 ENCOUNTER — Ambulatory Visit (INDEPENDENT_AMBULATORY_CARE_PROVIDER_SITE_OTHER): Payer: Medicare Other | Admitting: Family Medicine

## 2014-03-21 VITALS — BP 134/70 | HR 90 | Temp 97.8°F | Wt 166.0 lb

## 2014-03-21 DIAGNOSIS — M546 Pain in thoracic spine: Secondary | ICD-10-CM | POA: Diagnosis not present

## 2014-03-21 DIAGNOSIS — M545 Low back pain: Secondary | ICD-10-CM | POA: Diagnosis not present

## 2014-03-21 MED ORDER — TRAMADOL HCL 50 MG PO TABS: 50.0000 mg | ORAL_TABLET | Freq: Four times a day (QID) | ORAL | Status: DC | PRN

## 2014-03-21 NOTE — Patient Instructions (Signed)
DO NOT TAKE THE AMITRIPTYLINE AND THE TRAMADOL ON THE SAME DAY.

## 2014-03-21 NOTE — Progress Notes (Signed)
Pre visit review using our clinic review tool, if applicable. No additional management support is needed unless otherwise documented below in the visit note. 

## 2014-03-21 NOTE — Progress Notes (Signed)
Subjective:    Patient ID: Madison Hernandez, female    DOB: March 13, 1940, 74 y.o.   MRN: [id number]  HPI Patient seen for ER follow-up. She was there just yesterday morning with right lower lumbar back pain. She states that pain has fully resolved. She had been moving some furniture couple days prior to that and noticed sharp stabbing pain. No radiculopathy symptoms. No numbness or weakness. Her pain is worse with movement. She took a tramadol from her sister which helped her pain tremendously. She has no right lumbar back pain whatsoever today.  She describes a different pain today which is intermittent and underneath both breast and she describes a possible "muscle spasm ". This pain has been intermittent for the past month. No known injuries. No thoracic back pain. No appetite or weight changes. No fevers or chills. No night sweats. Pain last anywhere from seconds to minutes. She has not had episode now in a couple of weeks. No skin rash.  No chest pains and no exertional symptoms.  Past Medical History  Diagnosis Date  . Bleeding ulcer     22 years ago  . Hypertension   . Dyslipidemia   . Diverticulitis   . Arthritis   . Blood in stool   . Depression   . Headache(784.0)   . Cardiac arrhythmia due to congenital heart disease   . Hyperlipidemia   . UTI (urinary tract infection)   . Acute ischemic colitis 1/12  . Migraine    Past Surgical History  Procedure Laterality Date  . Bleeding ulcers      22 years ago  . Vein procedures      right and left lower extremity vein procedures    reports that she quit smoking about 31 years ago. Her smoking use included Cigarettes. She has a 20 pack-year smoking history. She has never used smokeless tobacco. She reports that she does not drink alcohol or use illicit drugs. family history includes Diabetes in her mother; Heart disease in her mother; Hypertension in her mother and sister. Allergies  Allergen Reactions  . Avelox [Moxifloxacin Hcl  In Nacl] Shortness Of Breath  . Mucinex [Guaifenesin Er]     "Makes my heart flutter"      Review of Systems  Constitutional: Negative for fever, chills, appetite change and unexpected weight change.  Respiratory: Negative for shortness of breath.   Cardiovascular: Negative for chest pain.  Gastrointestinal: Negative for nausea, vomiting and abdominal pain.  Genitourinary: Negative for flank pain.  Skin: Negative for rash.  Hematological: Negative for adenopathy. Does not bruise/bleed easily.       Objective:   Physical Exam  Constitutional: She appears well-developed and well-nourished.  Neck: Neck supple. No thyromegaly present.  Cardiovascular: Normal rate and regular rhythm.   Pulmonary/Chest: Effort normal and breath sounds normal. No respiratory distress. She has no wheezes. She has no rales.  Abdominal: Soft. Bowel sounds are normal. She exhibits no distension and no mass. There is no tenderness. There is no rebound and no guarding.  Musculoskeletal: She exhibits no edema.  No reproducible thoracic tenderness. We examined upper abdominal wall and area beneath both breasts with nurse present for exam throughout with no identifiable rashes or reproducible tenderness  Skin: No rash noted.          Assessment & Plan:  #1 right lumbar back pain. She was seen in ED for this yesterday. Resolved. Observe for now #2 intermittent bilateral upper abdominal/thoracic back pain. Symptoms are very  intermittent and not reproducible. No worrisome red flags. Observe for now.

## 2014-05-13 ENCOUNTER — Telehealth: Payer: Self-pay | Admitting: Family Medicine

## 2014-05-13 NOTE — Telephone Encounter (Signed)
Last visit 03/21/14 Last refill 03/21/14 #60 0 refill

## 2014-05-13 NOTE — Telephone Encounter (Signed)
Patient would like are-fill on traMADol (ULTRAM) 50 MG tablet.

## 2014-05-14 MED ORDER — TRAMADOL HCL 50 MG PO TABS: 50.0000 mg | ORAL_TABLET | Freq: Four times a day (QID) | ORAL | Status: DC | PRN

## 2014-05-14 NOTE — Telephone Encounter (Signed)
Faxed Rx to pharmacy  

## 2014-05-14 NOTE — Telephone Encounter (Signed)
Refill once.  Avoid regular use. 

## 2014-05-17 ENCOUNTER — Ambulatory Visit (INDEPENDENT_AMBULATORY_CARE_PROVIDER_SITE_OTHER): Payer: Medicare Other | Admitting: Adult Health

## 2014-05-17 ENCOUNTER — Encounter: Payer: Self-pay | Admitting: Adult Health

## 2014-05-17 VITALS — BP 160/80 | Temp 98.2°F | Wt 164.1 lb

## 2014-05-17 DIAGNOSIS — H6122 Impacted cerumen, left ear: Secondary | ICD-10-CM

## 2014-05-17 DIAGNOSIS — I10 Essential (primary) hypertension: Secondary | ICD-10-CM

## 2014-05-17 NOTE — Progress Notes (Signed)
Pre visit review using our clinic review tool, if applicable. No additional management support is needed unless otherwise documented below in the visit note. 

## 2014-05-17 NOTE — Progress Notes (Signed)
   Subjective:    Patient ID: Madison Hernandez, female    DOB: 08-31-1940, 74 y.o.   MRN: [id number]  HPI  Madison Hernandez presents to the office to have a cerumen impaction of her left ear removed. During examination she endorses that her blood pressure is high in the office and that her ankles are swelling. Patient states " I have been eating a lot of salt and I know that is why I am swollen and my blood pressure is high. I am going to stop eating salt."   She denies any headaches, blurred vision, nausea, vomiting or diarrhea.   Review of Systems  Constitutional: Negative.   HENT: Negative.   Eyes: Negative.   Respiratory: Negative.   Cardiovascular: Positive for leg swelling.  Gastrointestinal: Negative.   Endocrine: Negative.   Genitourinary: Negative.   Musculoskeletal: Negative.   Skin: Negative.   Allergic/Immunologic: Negative.   Neurological: Negative.   Hematological: Negative.   Psychiatric/Behavioral: Negative.   All other systems reviewed and are negative.      Objective:   Physical Exam  Constitutional: She is oriented to person, place, and time. She appears well-developed and well-nourished. No distress.  HENT:  Head: Normocephalic and atraumatic.  Moderate cerumen impaction in left ear.   Cardiovascular: Normal rate, regular rhythm and normal heart sounds.  Exam reveals no friction rub.   No murmur heard. Pulmonary/Chest: Breath sounds normal. No respiratory distress. She has no wheezes. She has no rales. She exhibits no tenderness.  Musculoskeletal: Normal range of motion. She exhibits edema.  Non pitting edema in bilateral ankles  Neurological: She is alert and oriented to person, place, and time.  Skin: Skin is warm and dry. No rash noted. No erythema. No pallor.  Psychiatric: She has a normal mood and affect. Her behavior is normal. Judgment and thought content normal.  Vitals reviewed.       Assessment & Plan:  1. Cerumen impaction, left - Cerumen  impaction dislodged with ear irrigation and curette.  - Slight redness in ear from cerumen impaction. Does not look infected.  - Follow up if any any pain, drainage or fever - Follow up as needed  2. Essential hypertension - Continue to take blood pressure medication as prescribed - Monitor blood pressure at home twice a day and record results.  - Notify PCP if blood pressures continue to be elevated - Discontinue use of salt and eat a healthy diet.  - Exercise for 30 minutes a day, five days a week,  - Follow up if no change in blood pressure - Follow up sooner if needed.

## 2014-05-17 NOTE — Patient Instructions (Addendum)
Your blood pressure is elevated today in the office. Please start monitoring your blood pressure at home and keep a diary of it. Reduce your salt intake and start getting outside to exercise more. If your blood pressure continues to be elevated, please follow up with Dr. Elease Hashimoto.    Cerumen Impaction A cerumen impaction is when the wax in your ear forms a plug. This plug usually causes reduced hearing. Sometimes it also causes an earache or dizziness. Removing a cerumen impaction can be difficult and painful. The wax sticks to the ear canal. The canal is sensitive and bleeds easily. If you try to remove a heavy wax buildup with a cotton tipped swab, you may push it in further. Irrigation with water, suction, and small ear curettes may be used to clear out the wax. If the impaction is fixed to the skin in the ear canal, ear drops may be needed for a few days to loosen the wax. People who build up a lot of wax frequently can use ear wax removal products available in your local drugstore. SEEK MEDICAL CARE IF:  You develop an earache, increased hearing loss, or marked dizziness. Document Released: 02/12/2004 Document Revised: 03/29/2011 Document Reviewed: 04/03/2009 [facility] Patient Information 2015 [place], [place]. This information is not intended to replace advice given to you by your health care provider. Make sure you discuss any questions you have with your health care provider. Low-Sodium Eating Plan Sodium raises blood pressure and causes water to be held in the body. Getting less sodium from food will help lower your blood pressure, reduce any swelling, and protect your heart, liver, and kidneys. We get sodium by adding salt (sodium chloride) to food. Most of our sodium comes from canned, boxed, and frozen foods. Restaurant foods, fast foods, and pizza are also very high in sodium. Even if you take medicine to lower your blood pressure or to reduce fluid in your body, getting less sodium from your  food is important. WHAT IS MY PLAN? Most people should limit their sodium intake to 2,300 mg a day. Your health care provider recommends that you limit your sodium intake to __________ a day.  WHAT DO I NEED TO KNOW ABOUT THIS EATING PLAN? For the low-sodium eating plan, you will follow these general guidelines:  Choose foods with a % Daily Value for sodium of less than 5% (as listed on the food label).   Use salt-free seasonings or herbs instead of table salt or sea salt.   Check with your health care provider or pharmacist before using salt substitutes.   Eat fresh foods.  Eat more vegetables and fruits.  Limit canned vegetables. If you do use them, rinse them well to decrease the sodium.   Limit cheese to 1 oz (28 g) per day.   Eat lower-sodium products, often labeled as "lower sodium" or "no salt added."  Avoid foods that contain monosodium glutamate (MSG). MSG is sometimes added to [place] food and some canned foods.  Check food labels (Nutrition Facts labels) on foods to learn how much sodium is in one serving.  Eat more home-cooked food and less restaurant, buffet, and fast food.  When eating at a restaurant, ask that your food be prepared with less salt or none, if possible.  HOW DO I READ FOOD LABELS FOR SODIUM INFORMATION? The Nutrition Facts label lists the amount of sodium in one serving of the food. If you eat more than one serving, you must multiply the listed amount of sodium  by the number of servings. Food labels may also identify foods as:  Sodium free--Less than 5 mg in a serving.  Very low sodium--35 mg or less in a serving.  Low sodium--140 mg or less in a serving.  Light in sodium--50% less sodium in a serving. For example, if a food that usually has 300 mg of sodium is changed to become light in sodium, it will have 150 mg of sodium.  Reduced sodium--25% less sodium in a serving. For example, if a food that usually has 400 mg of sodium is  changed to reduced sodium, it will have 300 mg of sodium. WHAT FOODS CAN I EAT? Grains Low-sodium cereals, including oats, puffed wheat and rice, and shredded wheat cereals. Low-sodium crackers. Unsalted rice and pasta. Lower-sodium bread.  Vegetables Frozen or fresh vegetables. Low-sodium or reduced-sodium canned vegetables. Low-sodium or reduced-sodium tomato sauce and paste. Low-sodium or reduced-sodium tomato and vegetable juices.  Fruits Fresh, frozen, and canned fruit. Fruit juice.  Meat and Other Protein Products Low-sodium canned tuna and salmon. Fresh or frozen meat, poultry, seafood, and fish. Lamb. Unsalted nuts. Dried beans, peas, and lentils without added salt. Unsalted canned beans. Homemade soups without salt. Eggs.  Dairy Milk. Soy milk. Ricotta cheese. Low-sodium or reduced-sodium cheeses. Yogurt.  Condiments Fresh and dried herbs and spices. Salt-free seasonings. Onion and garlic powders. Low-sodium varieties of mustard and ketchup. Lemon juice.  Fats and Oils Reduced-sodium salad dressings. Unsalted butter.  Other Unsalted popcorn and pretzels.  The items listed above may not be a complete list of recommended foods or beverages. Contact your dietitian for more options. WHAT FOODS ARE NOT RECOMMENDED? Grains Instant hot cereals. Bread stuffing, pancake, and biscuit mixes. Croutons. Seasoned rice or pasta mixes. Noodle soup cups. Boxed or frozen macaroni and cheese. Self-rising flour. Regular salted crackers. Vegetables Regular canned vegetables. Regular canned tomato sauce and paste. Regular tomato and vegetable juices. Frozen vegetables in sauces. Salted french fries. Olives. Madison Hernandez. Relishes. Sauerkraut. Salsa. Meat and Other Protein Products Salted, canned, smoked, spiced, or pickled meats, seafood, or fish. Bacon, ham, sausage, hot dogs, corned beef, chipped beef, and packaged luncheon meats. Salt pork. Jerky. Pickled herring. Anchovies, regular canned  tuna, and sardines. Salted nuts. Dairy Processed cheese and cheese spreads. Cheese curds. Blue cheese and cottage cheese. Buttermilk.  Condiments Onion and garlic salt, seasoned salt, table salt, and sea salt. Canned and packaged gravies. Worcestershire sauce. Tartar sauce. Barbecue sauce. Teriyaki sauce. Soy sauce, including reduced sodium. Steak sauce. Fish sauce. Oyster sauce. Cocktail sauce. Horseradish. Regular ketchup and mustard. Meat flavorings and tenderizers. Bouillon cubes. Hot sauce. Tabasco sauce. Marinades. Taco seasonings. Relishes. Fats and Oils Regular salad dressings. Salted butter. Margarine. Ghee. Bacon fat.  Other Potato and tortilla chips. Corn chips and puffs. Salted popcorn and pretzels. Canned or dried soups. Pizza. Frozen entrees and pot pies.  The items listed above may not be a complete list of foods and beverages to avoid. Contact your dietitian for more information. Document Released: 06/26/2001 Document Revised: 01/09/2013 Document Reviewed: 11/08/2012 [facility] Patient Information 2015 [place], [place]. This information is not intended to replace advice given to you by your health care provider. Make sure you discuss any questions you have with your health care provider.

## 2014-06-11 DIAGNOSIS — N390 Urinary tract infection, site not specified: Secondary | ICD-10-CM | POA: Diagnosis not present

## 2014-06-11 DIAGNOSIS — N3000 Acute cystitis without hematuria: Secondary | ICD-10-CM | POA: Diagnosis not present

## 2014-06-15 DIAGNOSIS — R21 Rash and other nonspecific skin eruption: Secondary | ICD-10-CM | POA: Diagnosis not present

## 2014-06-22 DIAGNOSIS — R300 Dysuria: Secondary | ICD-10-CM | POA: Diagnosis not present

## 2014-06-22 DIAGNOSIS — R350 Frequency of micturition: Secondary | ICD-10-CM | POA: Diagnosis not present

## 2014-07-01 ENCOUNTER — Ambulatory Visit (INDEPENDENT_AMBULATORY_CARE_PROVIDER_SITE_OTHER): Payer: Medicare Other | Admitting: Family Medicine

## 2014-07-01 ENCOUNTER — Telehealth: Payer: Self-pay | Admitting: Family Medicine

## 2014-07-01 VITALS — BP 140/80 | HR 88 | Temp 98.0°F | Wt 159.0 lb

## 2014-07-01 DIAGNOSIS — R600 Localized edema: Secondary | ICD-10-CM | POA: Diagnosis not present

## 2014-07-01 MED ORDER — AMLODIPINE BESYLATE 5 MG PO TABS: 5.0000 mg | ORAL_TABLET | Freq: Every day | ORAL | Status: DC

## 2014-07-01 MED ORDER — HYDROCHLOROTHIAZIDE 25 MG PO TABS: 25.0000 mg | ORAL_TABLET | Freq: Every day | ORAL | Status: DC

## 2014-07-01 NOTE — Patient Instructions (Signed)
Reduce Amlodipine to 5 mg once daily. Continue support hose Watch sodium intake. Monitor blood pressure at home and bring in your cuff to compare with ours at follow up.

## 2014-07-01 NOTE — Progress Notes (Signed)
Pre visit review using our clinic review tool, if applicable. No additional management support is needed unless otherwise documented below in the visit note. 

## 2014-07-01 NOTE — Progress Notes (Signed)
   Subjective:    Patient ID: Madison Hernandez, female    DOB: 1940/03/03, 74 y.o.   MRN: [id number]  HPI Patient seen with peripheral edema. She has history of hypertension and known venous stasis. She is on amlodipine 10 mg daily along with benazepril 20 mgs daily and HCTZ 25 mgs daily. Takes occasional non-steroidal. She's had some gradual bilateral leg edema. No orthopnea. No dyspnea with exertion. Her weight is accident down 7 pounds compared to March which she attributes to dietary change. Edema is worse late in the day. Recently using support hose. No dietary changes. No h x of CHF.  Past Medical History  Diagnosis Date  . Bleeding ulcer     22 years ago  . Hypertension   . Dyslipidemia   . Diverticulitis   . Arthritis   . Blood in stool   . Depression   . Headache(784.0)   . Cardiac arrhythmia due to congenital heart disease   . Hyperlipidemia   . UTI (urinary tract infection)   . Acute ischemic colitis 1/12  . Migraine    Past Surgical History  Procedure Laterality Date  . Bleeding ulcers      22 years ago  . Vein procedures      right and left lower extremity vein procedures    reports that she quit smoking about 31 years ago. Her smoking use included Cigarettes. She has a 20 pack-year smoking history. She has never used smokeless tobacco. She reports that she does not drink alcohol or use illicit drugs. family history includes Diabetes in her mother; Heart disease in her mother; Hypertension in her mother and sister. Allergies  Allergen Reactions  . Avelox [Moxifloxacin Hcl In Nacl] Shortness Of Breath  . Mucinex [Guaifenesin Er]     "Makes my heart flutter"  . Sulfa Antibiotics     hives      Review of Systems  Constitutional: Negative for appetite change, fatigue and unexpected weight change.  Eyes: Negative for visual disturbance.  Respiratory: Negative for cough, chest tightness, shortness of breath and wheezing.   Cardiovascular: Positive for leg  swelling. Negative for chest pain and palpitations.  Neurological: Negative for dizziness, seizures, syncope, weakness, light-headedness and headaches.       Objective:   Physical Exam  Constitutional: She appears well-developed and well-nourished.  Cardiovascular: Normal rate and regular rhythm.   Pulmonary/Chest: Effort normal and breath sounds normal. No respiratory distress. She has no wheezes. She has no rales.  Musculoskeletal:  Support hose in place. She has trace edema legs bilaterally          Assessment & Plan:  Bilateral leg edema. Probably multifactorial. She is on amlodipine 10 mg. We'll try reducing 5 mg and monitor blood pressure closely at home. Continue support hose. Reassess in 2 weeks and bring her cuff with her for comparison.

## 2014-07-01 NOTE — Telephone Encounter (Signed)
Refill for 6 months. 

## 2014-07-01 NOTE — Telephone Encounter (Signed)
This medication has not been filled in a while.  Okay to refill?

## 2014-07-01 NOTE — Telephone Encounter (Signed)
Pt needs refill on hctz 25 mg send to [facility]

## 2014-07-17 ENCOUNTER — Telehealth: Payer: Self-pay | Admitting: Family Medicine

## 2014-07-17 MED ORDER — HYDROCHLOROTHIAZIDE 25 MG PO TABS: 25.0000 mg | ORAL_TABLET | Freq: Every day | ORAL | Status: DC

## 2014-07-17 NOTE — Telephone Encounter (Signed)
Rx sent to pharmacy   

## 2014-07-17 NOTE — Telephone Encounter (Signed)
Pt request refill hydrochlorothiazide (HYDRODIURIL) 25 MG tablet [facility]  [place] appt 7[place]5 [place] , will cb to resc)

## 2014-07-23 ENCOUNTER — Ambulatory Visit: Payer: Medicare Other | Admitting: Family Medicine

## 2014-08-05 ENCOUNTER — Ambulatory Visit (INDEPENDENT_AMBULATORY_CARE_PROVIDER_SITE_OTHER): Payer: Medicare Other | Admitting: Family Medicine

## 2014-08-05 ENCOUNTER — Encounter: Payer: Self-pay | Admitting: Family Medicine

## 2014-08-05 VITALS — BP 140/74 | HR 90 | Temp 98.2°F | Wt 162.0 lb

## 2014-08-05 DIAGNOSIS — I10 Essential (primary) hypertension: Secondary | ICD-10-CM | POA: Diagnosis not present

## 2014-08-05 MED ORDER — HYDROCHLOROTHIAZIDE 25 MG PO TABS: 25.0000 mg | ORAL_TABLET | Freq: Every day | ORAL | Status: DC

## 2014-08-05 NOTE — Progress Notes (Signed)
   Subjective:    Patient ID: Madison Hernandez, female    DOB: 08-Jan-1941, 74 y.o.   MRN: [id number]  HPI Patient seen for follow-up regarding hypertension and recent mild peripheral edema. We reduced her amlodipine to 5 mg daily and she still had edema with that dosage. She then discontinued her amlodipine altogether and edema resolved. We also recently added HCTZ. Blood pressures been stable with most of her home readings below 314 systolic and below 80 diastolic. She's had no dizziness. No headaches. Weight is relatively stable  Past Medical History  Diagnosis Date  . Bleeding ulcer     22 years ago  . Hypertension   . Dyslipidemia   . Diverticulitis   . Arthritis   . Blood in stool   . Depression   . Headache(784.0)   . Cardiac arrhythmia due to congenital heart disease   . Hyperlipidemia   . UTI (urinary tract infection)   . Acute ischemic colitis 1/12  . Migraine    Past Surgical History  Procedure Laterality Date  . Bleeding ulcers      22 years ago  . Vein procedures      right and left lower extremity vein procedures    reports that she quit smoking about 31 years ago. Her smoking use included Cigarettes. She has a 20 pack-year smoking history. She has never used smokeless tobacco. She reports that she does not drink alcohol or use illicit drugs. family history includes Diabetes in her mother; Heart disease in her mother; Hypertension in her mother and sister. Allergies  Allergen Reactions  . Avelox [Moxifloxacin Hcl In Nacl] Shortness Of Breath  . Mucinex [Guaifenesin Er]     "Makes my heart flutter"  . Sulfa Antibiotics     hives      Review of Systems  Constitutional: Negative for fatigue and unexpected weight change.  Eyes: Negative for visual disturbance.  Respiratory: Negative for cough, chest tightness, shortness of breath and wheezing.   Cardiovascular: Negative for chest pain, palpitations and leg swelling.  Neurological: Negative for dizziness,  seizures, syncope, weakness, light-headedness and headaches.       Objective:   Physical Exam  Constitutional: She appears well-developed and well-nourished.  Neck: No JVD present.  Cardiovascular: Normal rate and regular rhythm.   Pulmonary/Chest: Effort normal and breath sounds normal. No respiratory distress. She has no wheezes. She has no rales.  Musculoskeletal: She exhibits no edema.          Assessment & Plan:  Hypertension. Stable and at goal. Her edema has improved after discontinuation of amlodipine. Continue benazepril and HCTZ. Continue close monitoring. Reassess 6 months.

## 2014-08-05 NOTE — Progress Notes (Signed)
Pre visit review using our clinic review tool, if applicable. No additional management support is needed unless otherwise documented below in the visit note. 

## 2014-10-03 DIAGNOSIS — Z23 Encounter for immunization: Secondary | ICD-10-CM | POA: Diagnosis not present

## 2014-10-07 DIAGNOSIS — B354 Tinea corporis: Secondary | ICD-10-CM | POA: Diagnosis not present

## 2014-10-10 ENCOUNTER — Telehealth: Payer: Self-pay | Admitting: Family Medicine

## 2014-10-10 DIAGNOSIS — H93239 Hyperacusis, unspecified ear: Secondary | ICD-10-CM

## 2014-10-10 NOTE — Telephone Encounter (Signed)
Referral placed.

## 2014-10-10 NOTE — Telephone Encounter (Signed)
yes

## 2014-10-10 NOTE — Telephone Encounter (Signed)
Pt is having trouble hearing and would like to see dr pahel audiologist for hearing evaluation [phone]. Pt has medicare and [employer]. Can we refer?

## 2014-10-10 NOTE — Telephone Encounter (Signed)
Okay to refer? 

## 2014-10-16 DIAGNOSIS — H906 Mixed conductive and sensorineural hearing loss, bilateral: Secondary | ICD-10-CM | POA: Diagnosis not present

## 2014-10-21 ENCOUNTER — Telehealth: Payer: Self-pay | Admitting: Family Medicine

## 2014-10-21 NOTE — Telephone Encounter (Signed)
Madison Hernandez is needing her traMADol (ULTRAM) 50 MG tablet refilled at: [place], [place] - [place] [phone] (Phone) [phone] (Fax)

## 2014-10-22 NOTE — Telephone Encounter (Signed)
Pt last visit 08/05/14. Pt last Rx refill 05/14/14 #60 no refills. Ok to refill?

## 2014-10-22 NOTE — Telephone Encounter (Signed)
Rx was call in at 2:25

## 2014-10-22 NOTE — Telephone Encounter (Signed)
Refill okay?  

## 2014-10-29 ENCOUNTER — Other Ambulatory Visit: Payer: Self-pay | Admitting: Family Medicine

## 2014-10-29 MED ORDER — HYDROCHLOROTHIAZIDE 25 MG PO TABS: 25.0000 mg | ORAL_TABLET | Freq: Every day | ORAL | Status: DC

## 2014-10-29 MED ORDER — BENAZEPRIL HCL 20 MG PO TABS: ORAL_TABLET | ORAL | Status: DC

## 2014-10-29 NOTE — Telephone Encounter (Signed)
Rxs done. 

## 2014-10-29 NOTE — Telephone Encounter (Signed)
Pt request refill of the following: benazepril (LOTENSIN) 20 MG tablet ,   hydrochlorothiazide (HYDRODIURIL) 25 MG tablet    Phamacy:  [facility]

## 2014-11-11 ENCOUNTER — Ambulatory Visit: Payer: Medicare Other | Admitting: Family Medicine

## 2014-12-05 ENCOUNTER — Encounter: Payer: Medicare Other | Admitting: Family Medicine

## 2014-12-16 ENCOUNTER — Other Ambulatory Visit: Payer: Self-pay | Admitting: Family Medicine

## 2014-12-30 ENCOUNTER — Telehealth: Payer: Self-pay | Admitting: Family Medicine

## 2014-12-30 MED ORDER — TRAMADOL HCL 50 MG PO TABS
50.0000 mg | ORAL_TABLET | Freq: Four times a day (QID) | ORAL | Status: DC | PRN
Start: 2014-12-30 — End: 2015-03-03

## 2014-12-30 NOTE — Telephone Encounter (Signed)
RX printed to be signed and faxed 

## 2014-12-30 NOTE — Telephone Encounter (Signed)
Madison Hernandez called saying she needs a refill of Tramadol. Please give her a call if needed.  Pt's ph# [phone] Thank you.

## 2014-12-30 NOTE — Telephone Encounter (Signed)
RX faxed to [facility]

## 2015-03-03 ENCOUNTER — Other Ambulatory Visit: Payer: Self-pay | Admitting: Family Medicine

## 2015-03-03 NOTE — Telephone Encounter (Signed)
Pt following up on tramadol request for refill at [facility] [address]

## 2015-03-04 NOTE — Telephone Encounter (Signed)
Last OV 08-05-2014 f/u No pending Refilled 12/30/2014 #60 Okay for refill?

## 2015-03-04 NOTE — Telephone Encounter (Signed)
Refill OK

## 2015-03-20 ENCOUNTER — Other Ambulatory Visit: Payer: Self-pay | Admitting: Family Medicine

## 2015-03-22 DIAGNOSIS — E860 Dehydration: Secondary | ICD-10-CM | POA: Diagnosis not present

## 2015-03-22 DIAGNOSIS — N390 Urinary tract infection, site not specified: Secondary | ICD-10-CM | POA: Diagnosis not present

## 2015-03-28 ENCOUNTER — Ambulatory Visit (INDEPENDENT_AMBULATORY_CARE_PROVIDER_SITE_OTHER): Payer: Medicare Other | Admitting: Internal Medicine

## 2015-03-28 ENCOUNTER — Ambulatory Visit: Payer: Medicare Other | Admitting: Internal Medicine

## 2015-03-28 ENCOUNTER — Encounter: Payer: Self-pay | Admitting: Internal Medicine

## 2015-03-28 VITALS — BP 154/60 | Temp 97.7°F | Ht 66.0 in | Wt 159.2 lb

## 2015-03-28 DIAGNOSIS — S90121A Contusion of right lesser toe(s) without damage to nail, initial encounter: Secondary | ICD-10-CM | POA: Diagnosis not present

## 2015-03-28 DIAGNOSIS — R208 Other disturbances of skin sensation: Secondary | ICD-10-CM

## 2015-03-28 DIAGNOSIS — R200 Anesthesia of skin: Secondary | ICD-10-CM

## 2015-03-28 NOTE — Patient Instructions (Signed)
Get appt with dr Elease Hashimoto about getting labs that day .  The single toe numbness  Seems to be a local phenomenon  And  Pressure on a nerve around th etoe Good  Shoe padding .   May helps also .  I dont think this is a diabetes of disease start problem

## 2015-03-28 NOTE — Progress Notes (Signed)
Pre visit review using our clinic review tool, if applicable. No additional management support is needed unless otherwise documented below in the visit note.   Chief Complaint  Patient presents with  . Stumped Toe    Stumped her toe yesterday.  Had numbness in same toe before.  . Toe Numbness    HPI: Madison Hernandez 75 y.o.  Comes in for sda appt  PCP NA  Injured toe this week rand into kitchen table furniture hurt then but not now More concern that she has numbness in that toe 4th right  For about a month and doesn't know why Is over due for rov and labs   Not nocturnal and comes and goes  No other numbness down legs or Burning   Has PF and  Has to wear flat shoes   No circulation problem at this time  ROS: See pertinent positives and negatives per HPI.  Past Medical History  Diagnosis Date  . Bleeding ulcer     22 years ago  . Hypertension   . Dyslipidemia   . Diverticulitis   . Arthritis   . Blood in stool   . Depression   . Headache(784.0)   . Cardiac arrhythmia due to congenital heart disease   . Hyperlipidemia   . UTI (urinary tract infection)   . Acute ischemic colitis ([place]) 1/12  . Migraine     Family History  Problem Relation Age of Onset  . Heart disease Mother     age 43  . Diabetes Mother   . Hypertension Mother   . Hypertension Sister     Social History   Social History  . Marital Status: Single    Spouse Name: N/A  . Number of Children: N/A  . Years of Education: N/A   Social History Main Topics  . Smoking status: Former Smoker -- 2.00 packs/day for 10 years    Types: Cigarettes    Quit date: 01/19/1983  . Smokeless tobacco: Never Used  . Alcohol Use: No  . Drug Use: No  . Sexual Activity: Not Asked   Other Topics Concern  . None   Social History Narrative   She retired early. She is non smoker. Never knew her father. Her mother died age 25 of heart disease. Her half sister [name]    Outpatient Prescriptions Prior to Visit   Medication Sig Dispense Refill  . amitriptyline (ELAVIL) 25 MG tablet Take 25 mg by mouth at bedtime as needed.     . benazepril (LOTENSIN) 20 MG tablet Take 1 tablet by mouth  daily 90 tablet 0  . hydrochlorothiazide (HYDRODIURIL) 25 MG tablet Take 1 tablet by mouth  daily 90 tablet 0  . traMADol (ULTRAM) 50 MG tablet TAKE 1 TABLET (50MG  TOTAL) BY MOUTH EVERY 6 HOURS AS NEEDED 60 tablet 0  . meloxicam (MOBIC) 15 MG tablet Take 1 tablet (15 mg total) by mouth daily. Generic only (Patient not taking: Reported on 03/28/2015) 90 tablet 2  . rosuvastatin (CRESTOR) 10 MG tablet Take 1 tablet by mouth  daily (Patient not taking: Reported on 03/28/2015) 90 tablet 3   No facility-administered medications prior to visit.     EXAM:  BP 154/60 mmHg  Temp(Src) 97.7 F (36.5 C) (Oral)  Ht 5\' 6"  (1.676 m)  Wt 159 lb 3.2 oz (72.213 kg)  BMI 25.71 kg/m2  Body mass index is 25.71 kg/(m^2).  GENERAL: vitals reviewed and listed above, alert, oriented, appears well hydrated and in no  acute distress HEENT: atraumatic, conjunctiva  clear, no obvious abnormalities on inspection of external nose and ears MS: moves all extremities w shes flat no support on sole   Foot  With bruising distal r 4th toe  With mild swelling and no real pain dec sense  But rest of toes normal and no callus or point tenderness  Pulses feet dp   =  Gait nl grossly   ASSESSMENT AND PLAN:  Discussed the following assessment and plan:  Numbness of toe - right 4th  no= pain suspect local compression nerve  issue and not neuropathy by hx and context  Toe contusion, right, initial encounter Advise   ROV with dr B as  Advised and he can also fu  advice S$ is a barrier about multiple visits etc but  Flat padding  In shoe may be of help. Has / about getting labs and fu  But  Best   To have  Lab tests  As per dr B when return for  Advised fu .  Disc x ray would not change outcome at this time -Patient advised to return or notify health  care team  if symptoms worsen ,persist or new concerns arise.  Patient Instructions  Get appt with dr Elease Hashimoto about getting labs that day .  The single toe numbness  Seems to be a local phenomenon  And  Pressure on a nerve around th etoe Good  Shoe padding .   May helps also .  I dont think this is a diabetes of disease start problem    [name]. Panosh M.D.

## 2015-04-04 ENCOUNTER — Ambulatory Visit (INDEPENDENT_AMBULATORY_CARE_PROVIDER_SITE_OTHER): Payer: Medicare Other | Admitting: Family Medicine

## 2015-04-04 ENCOUNTER — Ambulatory Visit: Payer: Medicare Other | Admitting: Family Medicine

## 2015-04-04 VITALS — BP 138/80 | HR 103 | Temp 98.1°F | Ht 66.0 in | Wt 150.3 lb

## 2015-04-04 DIAGNOSIS — R7309 Other abnormal glucose: Secondary | ICD-10-CM | POA: Diagnosis not present

## 2015-04-04 LAB — POCT GLYCOSYLATED HEMOGLOBIN (HGB A1C): Hemoglobin A1C: 5.8

## 2015-04-04 NOTE — Progress Notes (Signed)
   Subjective:    Patient ID: Madison Hernandez, female    DOB: 01/25/1940, 75 y.o.   MRN: [id number]  HPI  Patient seen for evaluation with concern for elevated blood sugar. She has never been diagnosed with diabetes. Her sister had a home glucose monitor and recently patient had fasting glucose 127 and on another day glucose around 140   she states she has lost about 10 pounds over the past couple months due to her efforts. She has no symptoms such as polyuria or polydipsia. She has reduced sugar intake greatly. Positive family history of diabetes in her mother. Not exercising regularly.  Past Medical History  Diagnosis Date  . Bleeding ulcer     22 years ago  . Hypertension   . Dyslipidemia   . Diverticulitis   . Arthritis   . Blood in stool   . Depression   . Headache(784.0)   . Cardiac arrhythmia due to congenital heart disease   . Hyperlipidemia   . UTI (urinary tract infection)   . Acute ischemic colitis ([place]) 1/12  . Migraine    Past Surgical History  Procedure Laterality Date  . Bleeding ulcers      22 years ago  . Vein procedures      right and left lower extremity vein procedures    reports that she quit smoking about 32 years ago. Her smoking use included Cigarettes. She has a 20 pack-year smoking history. She has never used smokeless tobacco. She reports that she does not drink alcohol or use illicit drugs. family history includes Diabetes in her mother; Heart disease in her mother; Hypertension in her mother and sister. Allergies  Allergen Reactions  . Avelox [Moxifloxacin Hcl In Nacl] Shortness Of Breath  . Mucinex [Guaifenesin Er]     "Makes my heart flutter"  . Sulfa Antibiotics     hives      Review of Systems  Constitutional: Negative for chills, appetite change, fatigue and unexpected weight change.  Eyes: Negative for visual disturbance.  Respiratory: Negative for cough, chest tightness, shortness of breath and wheezing.   Cardiovascular: Negative  for chest pain, palpitations and leg swelling.  Endocrine: Negative for polydipsia and polyuria.  Neurological: Negative for dizziness, seizures, syncope, weakness, light-headedness and headaches.       Objective:   Physical Exam  Constitutional: She appears well-developed and well-nourished.  Eyes: Pupils are equal, round, and reactive to light.  Neck: Neck supple. No JVD present. No thyromegaly present.  Cardiovascular: Normal rate and regular rhythm.  Exam reveals no gallop.   Pulmonary/Chest: Effort normal and breath sounds normal. No respiratory distress. She has no wheezes. She has no rales.  Musculoskeletal: She exhibits no edema.  Neurological: She is alert.          Assessment & Plan:   Hyperglycemia and probably controlled type 2 diabetes based on couple of recent non-confirmed fasting glucoses. Hemoglobin A1c today 5.8%. She's made some positive lifestyle changes. Continue monitoring. We offered home glucose monitor and she declines. Bring back in 4 months to reassess A1c. Also check basic metabolic panel at that time.

## 2015-04-04 NOTE — Patient Instructions (Signed)
Hyperglycemia °Hyperglycemia occurs when the glucose (sugar) in your blood is too high. Hyperglycemia can happen for many reasons, but it most often happens to people who do not know they have diabetes or are not managing their diabetes properly.  °CAUSES  °Whether you have diabetes or not, there are other causes of hyperglycemia. Hyperglycemia can occur when you have diabetes, but it can also occur in other situations that you might not be as aware of, such as: °Diabetes °· If you have diabetes and are having problems controlling your blood glucose, hyperglycemia could occur because of some of the following reasons: °¨ Not following your meal plan. °¨ Not taking your diabetes medications or not taking it properly. °¨ Exercising less or doing less activity than you normally do. °¨ Being sick. °Pre-diabetes °· This cannot be ignored. Before people develop Type 2 diabetes, they almost always have "pre-diabetes." This is when your blood glucose levels are higher than normal, but not yet high enough to be diagnosed as diabetes. Research has shown that some long-term damage to the body, especially the heart and circulatory system, may already be occurring during pre-diabetes. If you take action to manage your blood glucose when you have pre-diabetes, you may delay or prevent Type 2 diabetes from developing. °Stress °· If you have diabetes, you may be "diet" controlled or on oral medications or insulin to control your diabetes. However, you may find that your blood glucose is higher than usual in the hospital whether you have diabetes or not. This is often referred to as "stress hyperglycemia." Stress can elevate your blood glucose. This happens because of hormones put out by the body during times of stress. If stress has been the cause of your high blood glucose, it can be followed regularly by your caregiver. That way he/she can make sure your hyperglycemia does not continue to get worse or progress to  diabetes. °Steroids °· Steroids are medications that act on the infection fighting system (immune system) to block inflammation or infection. One side effect can be a rise in blood glucose. Most people can produce enough extra insulin to allow for this rise, but for those who cannot, steroids make blood glucose levels go even higher. It is not unusual for steroid treatments to "uncover" diabetes that is developing. It is not always possible to determine if the hyperglycemia will go away after the steroids are stopped. A special blood test called an A1c is sometimes done to determine if your blood glucose was elevated before the steroids were started. °SYMPTOMS °· Thirsty. °· Frequent urination. °· Dry mouth. °· Blurred vision. °· Tired or fatigue. °· Weakness. °· Sleepy. °· Tingling in feet or leg. °DIAGNOSIS  °Diagnosis is made by monitoring blood glucose in one or all of the following ways: °· A1c test. This is a chemical found in your blood. °· Fingerstick blood glucose monitoring. °· Laboratory results. °TREATMENT  °First, knowing the cause of the hyperglycemia is important before the hyperglycemia can be treated. Treatment may include, but is not be limited to: °· Education. °· Change or adjustment in medications. °· Change or adjustment in meal plan. °· Treatment for an illness, infection, etc. °· More frequent blood glucose monitoring. °· Change in exercise plan. °· Decreasing or stopping steroids. °· Lifestyle changes. °HOME CARE INSTRUCTIONS  °· Test your blood glucose as directed. °· Exercise regularly. Your caregiver will give you instructions about exercise. Pre-diabetes or diabetes which comes on with stress is helped by exercising. °· Eat wholesome,   balanced meals. Eat often and at regular, fixed times. Your caregiver or nutritionist will give you a meal plan to guide your sugar intake. °· Being at an ideal weight is important. If needed, losing as little as 10 to 15 pounds may help improve blood  glucose levels. °SEEK MEDICAL CARE IF:  °· You have questions about medicine, activity, or diet. °· You continue to have symptoms (problems such as increased thirst, urination, or weight gain). °SEEK IMMEDIATE MEDICAL CARE IF:  °· You are vomiting or have diarrhea. °· Your breath smells fruity. °· You are breathing faster or slower. °· You are very sleepy or incoherent. °· You have numbness, tingling, or pain in your feet or hands. °· You have chest pain. °· Your symptoms get worse even though you have been following your caregiver's orders. °· If you have any other questions or concerns. °  °This information is not intended to replace advice given to you by your health care provider. Make sure you discuss any questions you have with your health care provider. °  °Document Released: 06/30/2000 Document Revised: 03/29/2011 Document Reviewed: 09/10/2014 °Elsevier Interactive Patient Education ©2016 [employer] Inc[employer] ° °

## 2015-04-04 NOTE — Progress Notes (Signed)
Pre visit review using our clinic review tool, if applicable. No additional management support is needed unless otherwise documented below in the visit note. 

## 2015-05-19 ENCOUNTER — Telehealth: Payer: Self-pay | Admitting: *Deleted

## 2015-05-19 NOTE — Telephone Encounter (Signed)
Refill OK

## 2015-05-19 NOTE — Telephone Encounter (Signed)
Last seen on 04/04/2015 Pending appt 07/25/2015 Last filled 03/04/2015 #60 Please advise

## 2015-05-19 NOTE — Telephone Encounter (Signed)
Kristopher Oppenheim, [place] [phone] Rx refill traMADol (ULTRAM) 50 MG tablet

## 2015-05-20 MED ORDER — TRAMADOL HCL 50 MG PO TABS: ORAL_TABLET | ORAL | Status: DC

## 2015-05-20 NOTE — Telephone Encounter (Signed)
Verbally called in for patient.  

## 2015-06-04 ENCOUNTER — Telehealth: Payer: Self-pay | Admitting: Family Medicine

## 2015-06-04 MED ORDER — HYDROCHLOROTHIAZIDE 25 MG PO TABS: ORAL_TABLET | ORAL | Status: DC

## 2015-06-04 NOTE — Telephone Encounter (Signed)
Pt request refill  hydrochlorothiazide (HYDRODIURIL) 25 MG tablet   90 day  [facility]  Pt has an appointment 08/04/15

## 2015-06-04 NOTE — Telephone Encounter (Signed)
Medication sent in for patient. 

## 2015-06-15 ENCOUNTER — Other Ambulatory Visit: Payer: Self-pay | Admitting: Family Medicine

## 2015-07-24 ENCOUNTER — Other Ambulatory Visit: Payer: Self-pay | Admitting: Family Medicine

## 2015-07-24 NOTE — Telephone Encounter (Signed)
Last refill 05/20/2015 #60 Last OV 04/04/2015 Pending appt 08/04/15 Please advise on refill

## 2015-07-24 NOTE — Telephone Encounter (Signed)
Refill once OK. 

## 2015-08-04 ENCOUNTER — Ambulatory Visit (INDEPENDENT_AMBULATORY_CARE_PROVIDER_SITE_OTHER): Payer: Medicare Other | Admitting: Family Medicine

## 2015-08-04 VITALS — BP 140/90 | HR 96 | Temp 97.8°F | Ht 66.0 in | Wt 149.3 lb

## 2015-08-04 DIAGNOSIS — N289 Disorder of kidney and ureter, unspecified: Secondary | ICD-10-CM

## 2015-08-04 DIAGNOSIS — I10 Essential (primary) hypertension: Secondary | ICD-10-CM | POA: Diagnosis not present

## 2015-08-04 DIAGNOSIS — H6122 Impacted cerumen, left ear: Secondary | ICD-10-CM

## 2015-08-04 DIAGNOSIS — G4700 Insomnia, unspecified: Secondary | ICD-10-CM | POA: Diagnosis not present

## 2015-08-04 LAB — BASIC METABOLIC PANEL
BUN: 36 mg/dL — ABNORMAL HIGH (ref 6–23)
CALCIUM: 10.1 mg/dL (ref 8.4–10.5)
CO2: 24 meq/L (ref 19–32)
Chloride: 101 mEq/L (ref 96–112)
Creatinine, Ser: 1.42 mg/dL — ABNORMAL HIGH (ref 0.40–1.20)
GFR: 38.5 mL/min — ABNORMAL LOW (ref >60.00)
GLUCOSE: 105 mg/dL — AB (ref 70–99)
POTASSIUM: 4.6 meq/L (ref 3.5–5.1)
SODIUM: 136 meq/L (ref 135–145)

## 2015-08-04 NOTE — Progress Notes (Signed)
Pre visit review using our clinic review tool, if applicable. No additional management support is needed unless otherwise documented below in the visit note. 

## 2015-08-04 NOTE — Patient Instructions (Signed)
Insomnia Insomnia is a sleep disorder that makes it difficult to fall asleep or to stay asleep. Insomnia can cause tiredness (fatigue), low energy, difficulty concentrating, mood swings, and poor performance at work or school.  There are three different ways to classify insomnia:  Difficulty falling asleep.  Difficulty staying asleep.  Waking up too early in the morning. Any type of insomnia can be long-term (chronic) or short-term (acute). Both are common. Short-term insomnia usually lasts for three months or less. Chronic insomnia occurs at least three times a week for longer than three months. CAUSES  Insomnia may be caused by another condition, situation, or substance, such as:  Anxiety.  Certain medicines.  Gastroesophageal reflux disease (GERD) or other gastrointestinal conditions.  Asthma or other breathing conditions.  Restless legs syndrome, sleep apnea, or other sleep disorders.  Chronic pain.  Menopause. This may include hot flashes.  Stroke.  Abuse of alcohol, tobacco, or illegal drugs.  Depression.  Caffeine.   Neurological disorders, such as Alzheimer disease.  An overactive thyroid (hyperthyroidism). The cause of insomnia may not be known. RISK FACTORS Risk factors for insomnia include:  Gender. Women are more commonly affected than men.  Age. Insomnia is more common as you get older.  Stress. This may involve your professional or personal life.  Income. Insomnia is more common in people with lower income.  Lack of exercise.   Irregular work schedule or night shifts.  Traveling between different time zones. SIGNS AND SYMPTOMS If you have insomnia, trouble falling asleep or trouble staying asleep is the main symptom. This may lead to other symptoms, such as:  Feeling fatigued.  Feeling nervous about going to sleep.  Not feeling rested in the morning.  Having trouble concentrating.  Feeling irritable, anxious, or depressed. TREATMENT   Treatment for insomnia depends on the cause. If your insomnia is caused by an underlying condition, treatment will focus on addressing the condition. Treatment may also include:   Medicines to help you sleep.  Counseling or therapy.  Lifestyle adjustments. HOME CARE INSTRUCTIONS   Take medicines only as directed by your health care provider.  Keep regular sleeping and waking hours. Avoid naps.  Keep a sleep diary to help you and your health care provider figure out what could be causing your insomnia. Include:   When you sleep.  When you wake up during the night.  How well you sleep.   How rested you feel the next day.  Any side effects of medicines you are taking.  What you eat and drink.   Make your bedroom a comfortable place where it is easy to fall asleep:  Put up shades or special blackout curtains to block light from outside.  Use a white noise machine to block noise.  Keep the temperature cool.   Exercise regularly as directed by your health care provider. Avoid exercising right before bedtime.  Use relaxation techniques to manage stress. Ask your health care provider to suggest some techniques that may work well for you. These may include:  Breathing exercises.  Routines to release muscle tension.  Visualizing peaceful scenes.  Cut back on alcohol, caffeinated beverages, and cigarettes, especially close to bedtime. These can disrupt your sleep.  Do not overeat or eat spicy foods right before bedtime. This can lead to digestive discomfort that can make it hard for you to sleep.  Limit screen use before bedtime. This includes:  Watching TV.  Using your smartphone, tablet, and computer.  Stick to a routine. This   can help you fall asleep faster. Try to do a quiet activity, brush your teeth, and go to bed at the same time each night.  Get out of bed if you are still awake after 15 minutes of trying to sleep. Keep the lights down, but try reading or  doing a quiet activity. When you feel sleepy, go back to bed.  Make sure that you drive carefully. Avoid driving if you feel very sleepy.  Keep all follow-up appointments as directed by your health care provider. This is important. SEEK MEDICAL CARE IF:   You are tired throughout the day or have trouble in your daily routine due to sleepiness.  You continue to have sleep problems or your sleep problems get worse. SEEK IMMEDIATE MEDICAL CARE IF:   You have serious thoughts about hurting yourself or someone else.   This information is not intended to replace advice given to you by your health care provider. Make sure you discuss any questions you have with your health care provider.   Document Released: 01/02/2000 Document Revised: 09/25/2014 Document Reviewed: 10/05/2013 Elsevier Interactive Patient Education 2016 Elsevier Inc.  

## 2015-08-04 NOTE — Progress Notes (Signed)
Subjective:     Patient ID: Madison Hernandez, female   DOB: 1940/05/02, 75 y.o.   MRN: [id number]  HPI Patient seen for medical follow-up  Hypertension treated with benazepril and HCTZ. Compliant with therapy. No dizziness. No headaches. No peripheral edema.  Urologist has her on amitriptyline for urine urgency but she had what she described as "crazy dreams ". She quit taking this several days ago. She's had increased insomnia since then. No daytime naps. No caffeine use. No alcohol use. She states she sometimes only getting 1-2 hours of sleep per night. She has sometimes taken Benadryl in the past but felt "hyped up" Denies depression symptoms.  Fullness left ear. She is concerned about possible cerumen which she's had the past  Past Medical History  Diagnosis Date  . Bleeding ulcer     22 years ago  . Hypertension   . Dyslipidemia   . Diverticulitis   . Arthritis   . Blood in stool   . Depression   . Headache(784.0)   . Cardiac arrhythmia due to congenital heart disease   . Hyperlipidemia   . UTI (urinary tract infection)   . Acute ischemic colitis ([place]) 1/12  . Migraine    Past Surgical History  Procedure Laterality Date  . Bleeding ulcers      22 years ago  . Vein procedures      right and left lower extremity vein procedures    reports that she quit smoking about 32 years ago. Her smoking use included Cigarettes. She has a 20 pack-year smoking history. She has never used smokeless tobacco. She reports that she does not drink alcohol or use illicit drugs. family history includes Diabetes in her mother; Heart disease in her mother; Hypertension in her mother and sister. Allergies  Allergen Reactions  . Avelox [Moxifloxacin Hcl In Nacl] Shortness Of Breath  . Mucinex [Guaifenesin Er]     "Makes my heart flutter"  . Sulfa Antibiotics     hives     Review of Systems  Constitutional: Negative for appetite change, fatigue and unexpected weight change.  Eyes: Negative  for visual disturbance.  Respiratory: Negative for cough, chest tightness, shortness of breath and wheezing.   Cardiovascular: Negative for chest pain, palpitations and leg swelling.  Neurological: Negative for dizziness, seizures, syncope, weakness, light-headedness and headaches.  Psychiatric/Behavioral: Positive for sleep disturbance.       Objective:   Physical Exam  Constitutional: She appears well-developed and well-nourished.  HENT:  Cerumen impaction left canal. Removed with curette. Eardrum appears normal. Right eardrum chronic perforation  Eyes: Pupils are equal, round, and reactive to light.  Neck: Neck supple. No JVD present. No thyromegaly present.  Cardiovascular: Normal rate and regular rhythm.  Exam reveals no gallop.   Pulmonary/Chest: Effort normal and breath sounds normal. No respiratory distress. She has no wheezes. She has no rales.  Musculoskeletal: She exhibits no edema.  Neurological: She is alert.  Psychiatric: She has a normal mood and affect. Her behavior is normal. Judgment and thought content normal.       Assessment:    #1 hypertension. Borderline elevation today. Possibly related to poor sleep last night  #2 cerumen impaction left canal  #3 insomnia    Plan:     -Check basic metabolic panel -Continue to monitor blood pressure and be in touch if consistently greater than 150/90 -Cerumen removed with curette left canal -Sleep hygiene discussed. We recommend that she try to avoid amitriptyline if possible. She has  already tried melatonin without relief. She will try Tylenol PM which interestingly she states she has taken okay in the past but for some reason could not take plain Benadryl.  Eulas Post MD [facility] at [facility]

## 2015-08-05 NOTE — Addendum Note (Signed)
Addended by: Elio Forget on: 08/05/2015 10:02 AM   Modules accepted: Orders

## 2015-09-08 ENCOUNTER — Other Ambulatory Visit (INDEPENDENT_AMBULATORY_CARE_PROVIDER_SITE_OTHER): Payer: Medicare Other

## 2015-09-08 DIAGNOSIS — N289 Disorder of kidney and ureter, unspecified: Secondary | ICD-10-CM | POA: Diagnosis not present

## 2015-09-08 LAB — BASIC METABOLIC PANEL
BUN: 15 mg/dL (ref 6–23)
CALCIUM: 9.7 mg/dL (ref 8.4–10.5)
CO2: 26 mEq/L (ref 19–32)
Chloride: 87 mEq/L — ABNORMAL LOW (ref 96–112)
Creatinine, Ser: 1.11 mg/dL (ref 0.40–1.20)
GFR: 51.14 mL/min — AB (ref >60.00)
Glucose, Bld: 96 mg/dL (ref 70–99)
Potassium: 4.7 mEq/L (ref 3.5–5.1)
SODIUM: 123 meq/L — AB (ref 135–145)

## 2015-09-12 ENCOUNTER — Encounter: Payer: Self-pay | Admitting: Family Medicine

## 2015-09-12 ENCOUNTER — Ambulatory Visit (INDEPENDENT_AMBULATORY_CARE_PROVIDER_SITE_OTHER): Payer: Medicare Other | Admitting: Family Medicine

## 2015-09-12 DIAGNOSIS — E871 Hypo-osmolality and hyponatremia: Secondary | ICD-10-CM

## 2015-09-12 DIAGNOSIS — H8110 Benign paroxysmal vertigo, unspecified ear: Secondary | ICD-10-CM

## 2015-09-12 DIAGNOSIS — I10 Essential (primary) hypertension: Secondary | ICD-10-CM | POA: Diagnosis not present

## 2015-09-12 LAB — BASIC METABOLIC PANEL
BUN: 12 mg/dL (ref 6–23)
CHLORIDE: 90 meq/L — AB (ref 96–112)
CO2: 27 meq/L (ref 19–32)
CREATININE: 1.07 mg/dL (ref 0.40–1.20)
Calcium: 10.1 mg/dL (ref 8.4–10.5)
GFR: 53.35 mL/min — ABNORMAL LOW (ref >60.00)
Glucose, Bld: 106 mg/dL — ABNORMAL HIGH (ref 70–99)
POTASSIUM: 4.7 meq/L (ref 3.5–5.1)
Sodium: 124 mEq/L — ABNORMAL LOW (ref 135–145)

## 2015-09-12 NOTE — Progress Notes (Signed)
Subjective:     Patient ID: Madison Hernandez, female   DOB: 1940/12/09, 75 y.o.   MRN: [id number]  HPI Patient seen for follow-up regarding hypertension and recent hyponatremia. Her blood pressure has been treated with benazepril and HCTZ.  She had recent bump in creatinine to 1.4 and we had recommended close follow-up. Subsequent basic metabolic panel creatinine down to 1.11 but her sodium was down 123. She was advised that point to stop HCTZ. Patient is here today for follow-up labs. She's had some occasional intermittent vertigo symptoms over the past few days but no hearing changes. No syncope or presyncopal symptoms.  Patient states that day she had her sodium 123 she had about 7 or 8 bottles of water-which is more than usual for her. She does not have prior history of hyponatremia. No recent vomiting or diarrhea. No palpitations. No chest pains.  Past Medical History:  Diagnosis Date  . Acute ischemic colitis ([place]) 1/12  . Arthritis   . Bleeding ulcer    22 years ago  . Blood in stool   . Cardiac arrhythmia due to congenital heart disease   . Depression   . Diverticulitis   . Dyslipidemia   . Headache(784.0)   . Hyperlipidemia   . Hypertension   . Migraine   . UTI (urinary tract infection)    Past Surgical History:  Procedure Laterality Date  . bleeding ulcers     22 years ago  . vein procedures     right and left lower extremity vein procedures    reports that she quit smoking about 32 years ago. Her smoking use included Cigarettes. She has a 20.00 pack-year smoking history. She has never used smokeless tobacco. She reports that she does not drink alcohol or use drugs. family history includes Diabetes in her mother; Heart disease in her mother; Hypertension in her mother and sister. Allergies  Allergen Reactions  . Avelox [Moxifloxacin Hcl In Nacl] Shortness Of Breath  . Mucinex [Guaifenesin Er]     "Makes my heart flutter"  . Sulfa Antibiotics     hives     Review  of Systems  Constitutional: Negative for fatigue and unexpected weight change.  Eyes: Negative for visual disturbance.  Respiratory: Negative for cough, chest tightness, shortness of breath and wheezing.   Cardiovascular: Negative for chest pain, palpitations and leg swelling.  Endocrine: Negative for polydipsia and polyuria.  Neurological: Positive for dizziness. Negative for seizures, syncope, weakness, light-headedness and headaches.       Objective:   Physical Exam  Constitutional: She is oriented to person, place, and time. She appears well-developed and well-nourished.  HENT:  Mouth/Throat: Oropharynx is clear and moist.  Eyes: Pupils are equal, round, and reactive to light.  Neck: Neck supple. No thyromegaly present.  Cardiovascular: Normal rate and regular rhythm.   Pulmonary/Chest: Effort normal and breath sounds normal. No respiratory distress. She has no wheezes. She has no rales.  Musculoskeletal: She exhibits no edema.  Lymphadenopathy:    She has no cervical adenopathy.  Neurological: She is alert and oriented to person, place, and time. No cranial nerve deficit. Coordination normal.  No focal strength deficits       Assessment:     #1 hypertension. Stable following recent discontinuation of HCTZ  #2 hyponatremia-likely related to combination of thiazide and excessive water consumption  #3 intermittent vertigo.    Plan:     -Check basic metabolic panel -Avoid excessive water consumption over short-term -Continue Lotensin -Monitor blood  pressure closely and be in touch if consistently greater than 150/90  Eulas Post MD [facility] at [facility]

## 2015-09-12 NOTE — Patient Instructions (Signed)

## 2015-09-15 ENCOUNTER — Other Ambulatory Visit: Payer: Self-pay | Admitting: Family Medicine

## 2015-09-15 NOTE — Telephone Encounter (Signed)
Rx refill sent to pharmacy. 

## 2015-09-16 NOTE — Addendum Note (Signed)
Addended by: Elio Forget on: 09/16/2015 05:46 PM   Modules accepted: Orders

## 2015-09-17 ENCOUNTER — Other Ambulatory Visit: Payer: Medicare Other

## 2015-09-17 DIAGNOSIS — E871 Hypo-osmolality and hyponatremia: Secondary | ICD-10-CM

## 2015-09-18 LAB — SODIUM, URINE, RANDOM: Sodium, Ur: 32 mmol/L (ref 28–272)

## 2015-09-18 LAB — OSMOLALITY, URINE: Osmolality, Ur: 351 mOsm/kg (ref 50–1200)

## 2015-10-06 DIAGNOSIS — Z23 Encounter for immunization: Secondary | ICD-10-CM | POA: Diagnosis not present

## 2015-12-01 ENCOUNTER — Emergency Department (HOSPITAL_COMMUNITY)
Admission: EM | Admit: 2015-12-01 | Discharge: 2015-12-01 | Disposition: A | Discharge status: Home / Self Care | Payer: Medicare Other | Attending: Emergency Medicine | Admitting: Emergency Medicine

## 2015-12-01 ENCOUNTER — Encounter (HOSPITAL_COMMUNITY): Payer: Self-pay

## 2015-12-01 DIAGNOSIS — Z79899 Other long term (current) drug therapy: Secondary | ICD-10-CM | POA: Diagnosis not present

## 2015-12-01 DIAGNOSIS — Z87891 Personal history of nicotine dependence: Secondary | ICD-10-CM | POA: Diagnosis not present

## 2015-12-01 DIAGNOSIS — X500XXA Overexertion from strenuous movement or load, initial encounter: Secondary | ICD-10-CM | POA: Insufficient documentation

## 2015-12-01 DIAGNOSIS — Y929 Unspecified place or not applicable: Secondary | ICD-10-CM | POA: Insufficient documentation

## 2015-12-01 DIAGNOSIS — G8929 Other chronic pain: Secondary | ICD-10-CM | POA: Insufficient documentation

## 2015-12-01 DIAGNOSIS — M546 Pain in thoracic spine: Secondary | ICD-10-CM | POA: Diagnosis not present

## 2015-12-01 DIAGNOSIS — Y999 Unspecified external cause status: Secondary | ICD-10-CM | POA: Insufficient documentation

## 2015-12-01 DIAGNOSIS — M62830 Muscle spasm of back: Secondary | ICD-10-CM | POA: Diagnosis not present

## 2015-12-01 DIAGNOSIS — Y939 Activity, unspecified: Secondary | ICD-10-CM | POA: Diagnosis not present

## 2015-12-01 DIAGNOSIS — N189 Chronic kidney disease, unspecified: Secondary | ICD-10-CM | POA: Diagnosis not present

## 2015-12-01 DIAGNOSIS — I129 Hypertensive chronic kidney disease with stage 1 through stage 4 chronic kidney disease, or unspecified chronic kidney disease: Secondary | ICD-10-CM | POA: Insufficient documentation

## 2015-12-01 DIAGNOSIS — M549 Dorsalgia, unspecified: Secondary | ICD-10-CM | POA: Diagnosis not present

## 2015-12-01 LAB — URINALYSIS, ROUTINE W REFLEX MICROSCOPIC
BILIRUBIN URINE: NEGATIVE
Glucose, UA: 100 mg/dL — AB
Hgb urine dipstick: NEGATIVE
KETONES UR: NEGATIVE mg/dL
LEUKOCYTES UA: NEGATIVE
NITRITE: NEGATIVE
PROTEIN: NEGATIVE mg/dL
Specific Gravity, Urine: 1.01 (ref 1.005–1.030)
pH: 6 (ref 5.0–8.0)

## 2015-12-01 MED ORDER — HYDROCODONE-ACETAMINOPHEN 5-325 MG PO TABS
1.0000 | ORAL_TABLET | Freq: Once | ORAL | Status: AC
  Administered 2015-12-01: 1 via ORAL
  Filled 2015-12-01: qty 1

## 2015-12-01 MED ORDER — HYDROCODONE-ACETAMINOPHEN 5-325 MG PO TABS: 1.0000 | ORAL_TABLET | Freq: Four times a day (QID) | ORAL | 0 refills | Status: DC | PRN

## 2015-12-01 MED ORDER — NAPROXEN 250 MG PO TABS: 250.0000 mg | ORAL_TABLET | Freq: Two times a day (BID) | ORAL | 0 refills | Status: DC

## 2015-12-01 NOTE — ED Triage Notes (Signed)
Pt states that she been having bacl pain 10/10 that is described as burning onset occurred today. Pt reports that she had went to urgent today and was prescribed Prednisone 20mg , but was not given anything for pain. Pt states that she took a tramadol about 45 minutes ago however it was ineffective in relieving

## 2015-12-01 NOTE — Discharge Instructions (Signed)
Back Pain: Your back pain should be treated with medicines such as ibuprofen or aleve and this back pain should get better over the next 2 weeks.  However if you develop severe or worsening pain, low back pain with fever, numbness, weakness or inability to walk or urinate, you should return to the ER immediately.  Please follow up with your doctor this week for a recheck if still having symptoms.  Avoid heavy lifting over 10 pounds over the next two weeks.  Back pain is discomfort in the back that may be due to injuries to muscles and ligaments around the spine.  Occasionally, it may be caused by a a problem to a part of the spine called a disc.  The pain may last several days or a week;  However, most patients get completely well in 4 weeks.  Self - care:  The application of heat can help soothe the pain.  Maintaining your daily activities, including walking, is encourged, as it will help you get better faster than just staying in bed. Perform gentle stretching as discussed. Drink plenty of fluids.  Medications are also useful to help with pain control.  A commonly prescribed medication includes norco.  Do not drive or operate heavy machinery while taking this medication.  Non steroidal anti inflammatory medications including Ibuprofen and naproxen;  These medications help both pain and swelling and are very useful in treating back pain.  They should be taken with food, as they can cause stomach upset, and more seriously, stomach bleeding.    Steroids: continue taking the steroid given to you at the urgent care, take as directed to help with inflammation.   SEEK IMMEDIATE MEDICAL ATTENTION IF: New numbness, tingling, weakness, or problem with the use of your arms or legs.  Severe back pain not relieved with medications.  Difficulty with or loss of control of your bowel or bladder control.  Increasing pain in any areas of the body (such as chest or abdominal pain).  Shortness of breath, dizziness  or fainting.  Nausea (feeling sick to your stomach), vomiting, fever, or sweats.  You will need to follow up with  Your primary healthcare provider in 3-5 days for reassessment.

## 2015-12-01 NOTE — ED Provider Notes (Signed)
[place] DEPT Provider Note   CSN: [id number] Arrival date & time: 12/01/15  2029     History   Chief Complaint Chief Complaint  Patient presents with  . Back Pain    HPI Madison Hernandez is a 75 y.o. female with a PMHx of HTN, HLD, migraines, chronic back pain, CKD, diverticulosis, and other medical problems listed below, who presents to the ED with complaints of acute on chronic mid-thoracic back pain that began yesterday after she lifted a heavy box with christmas decorations the day prior to onset. She states this pain is the same back pain she's had in the past, it flares up occasionally. Chart review reveals she was seen in the ED for similar complaints on 03/2014, given norco and told to f/up with PCP. She has also seen her PCP on several occasions for her back pain when it aggravates her, which always seems to occur after heavy lifting.   She reports that she went to an urgent care earlier today, received a shot of "cortisone I think" and some prednisone pills which have not provided significant relief, so she came to the emergency room for further evaluation and management. She states that she attempted to call her PCPs office yesterday to be seen by them, but waited on hold for 20 minutes and was unable to get ahold of anybody, so she stopped trying. She describes her back pain today as 10/10 constant stabbing and burning nonradiating mid thoracic pain, worse with movement, and unrelieved with cortisone shot, prednisone, Aleve, and tramadol. She reports that in the past, she's used muscle relaxers without any relief. She recalls that [facility] [facility] at her last ED visit did help with her pain, she doesn't take pain pills regularly, only when her back pain flares up which is fairly infrequent.    She denies any fevers, chills, chest pain, shortness breath, abdominal pain, nausea, vomiting, diarrhea, constipation, dysuria, hematuria, incontinence of urine or stool, saddle anesthesia or cauda  equina symptoms, numbness, tingling, focal weakness, history of cancer, or IV drug use. Denies any other complaints at this time.   The history is provided by the patient and medical records. No language interpreter was used.  Back Pain   This is a recurrent problem. The current episode started yesterday. The problem occurs constantly. The problem has not changed since onset.The pain is associated with lifting heavy objects. The pain is present in the thoracic spine. The quality of the pain is described as stabbing and burning. The pain does not radiate. The pain is at a severity of 10/10. The pain is severe. The symptoms are aggravated by bending and twisting. The pain is the same all the time. Pertinent negatives include no chest pain, no fever, no numbness, no abdominal pain, no bowel incontinence, no perianal numbness, no bladder incontinence, no dysuria, no paresthesias, no paresis, no tingling and no weakness. She has tried NSAIDs (cortisone shot at [facility], and tramadol) for the symptoms. The treatment provided no relief.    Past Medical History:  Diagnosis Date  . Acute ischemic colitis ([place]) 1/12  . Arthritis   . Bleeding ulcer    22 years ago  . Blood in stool   . Cardiac arrhythmia due to congenital heart disease   . Depression   . Diverticulitis   . Dyslipidemia   . Headache(784.0)   . Hyperlipidemia   . Hypertension   . Migraine   . UTI (urinary tract infection)     Patient Active Problem List  Diagnosis Date Noted  . Osteoarthritis, multiple sites 11/06/2013  . Chronic kidney disease 03/14/2012  . Hyperglycemia 02/14/2012  . Edema 07/21/2011  . Fatigue 01/21/2011  . History of depression 08/21/2010  . Acute ischemic colitis ([place]) 08/21/2010  . Migraine headache 08/21/2010  . Abnormal ECG 07/08/2010  . HTN (hypertension) 07/08/2010  . Mixed hyperlipidemia 07/08/2010  . Abnormal ECG 07/08/2010  . Palpitations 07/08/2010    Past Surgical History:  Procedure  Laterality Date  . bleeding ulcers     22 years ago  . vein procedures     right and left lower extremity vein procedures    OB History    No data available       Home Medications    Prior to Admission medications   Medication Sig Start Date End Date Taking? Authorizing Provider  amitriptyline (ELAVIL) 25 MG tablet Take 25 mg by mouth at bedtime as needed.  07/02/14   Historical Provider, MD  benazepril (LOTENSIN) 20 MG tablet Take 1 tablet by mouth  daily 09/15/15   Eulas Post, MD  traMADol (ULTRAM) 50 MG tablet TAKE 1 TABLET BY MOUTH EVERY 6 HOURS AS NEEDED 07/25/15   Eulas Post, MD    Family History Family History  Problem Relation Age of Onset  . Heart disease Mother     age 40  . Diabetes Mother   . Hypertension Mother   . Hypertension Sister     Social History Social History  Substance Use Topics  . Smoking status: Former Smoker    Packs/day: 2.00    Years: 10.00    Types: Cigarettes    Quit date: 01/19/1983  . Smokeless tobacco: Never Used  . Alcohol use No     Allergies   Avelox [moxifloxacin hcl in nacl]; Mucinex [guaifenesin er]; and Sulfa antibiotics   Review of Systems Review of Systems  Constitutional: Negative for chills and fever.  Respiratory: Negative for shortness of breath.   Cardiovascular: Negative for chest pain.  Gastrointestinal: Negative for abdominal pain, bowel incontinence, constipation, diarrhea, nausea and vomiting.  Genitourinary: Negative for bladder incontinence, difficulty urinating (no incontinence), dysuria, hematuria and vaginal discharge.  Musculoskeletal: Positive for back pain. Negative for arthralgias and myalgias.  Skin: Negative for color change.  Allergic/Immunologic: Negative for immunocompromised state.  Neurological: Negative for tingling, weakness, numbness and paresthesias.  Psychiatric/Behavioral: Negative for confusion.   10 Systems reviewed and are negative for acute change except as noted in the  HPI.   Physical Exam Updated Vital Signs BP 164/82 (BP Location: Right Arm)   Pulse 93   Temp 97.6 F (36.4 C) (Oral)   Resp 18   Ht 5\' 6"  (1.676 m)   Wt 72.1 kg   SpO2 99%   BMI 25.66 kg/m   Physical Exam  Constitutional: She is oriented to person, place, and time. Vital signs are normal. She appears well-developed and well-nourished.  Non-toxic appearance. No distress.  Afebrile, nontoxic, NAD  HENT:  Head: Normocephalic and atraumatic.  Mouth/Throat: Oropharynx is clear and moist and mucous membranes are normal.  Eyes: Conjunctivae and EOM are normal. Right eye exhibits no discharge. Left eye exhibits no discharge.  Neck: Normal range of motion. Neck supple. No spinous process tenderness and no muscular tenderness present. No neck rigidity. Normal range of motion present.  FROM intact without spinous process TTP, no bony stepoffs or deformities, no paraspinous muscle TTP or muscle spasms. No rigidity or meningeal signs. No bruising or swelling.   Cardiovascular:  Normal rate, regular rhythm, normal heart sounds and intact distal pulses.  Exam reveals no gallop and no friction rub.   No murmur heard. Pulmonary/Chest: Effort normal and breath sounds normal. No respiratory distress. She has no decreased breath sounds. She has no wheezes. She has no rhonchi. She has no rales.  Abdominal: Soft. Normal appearance and bowel sounds are normal. She exhibits no distension and no pulsatile midline mass. There is no tenderness. There is no rigidity, no rebound, no guarding, no CVA tenderness, no tenderness at McBurney's point and negative Murphy's sign.  Soft, NTND, +BS throughout, no r/g/r, neg murphy's, neg mcburney's, no CVA TTP, no midline pulsatile mass  Musculoskeletal: Normal range of motion.       Thoracic back: She exhibits tenderness and spasm. She exhibits normal range of motion, no bony tenderness and no deformity.       Back:  Lumbar spine with FROM intact without spinous process  TTP, no bony stepoffs or deformities, with mild R-sided paraspinous muscle TTP and palpable muscle spasm. Strength and sensation grossly intact in all extremities, negative SLR bilaterally, gait steady and nonantalgic. No overlying skin changes. Distal pulses intact.   Neurological: She is alert and oriented to person, place, and time. She has normal strength. No sensory deficit.  Skin: Skin is warm, dry and intact. No rash noted.  Psychiatric: She has a normal mood and affect.  Nursing note and vitals reviewed.    ED Treatments / Results  Labs (all labs ordered are listed, but only abnormal results are displayed) Labs Reviewed  URINALYSIS, ROUTINE W REFLEX MICROSCOPIC (NOT AT [facility]) - Abnormal; Notable for the following:       Result Value   Glucose, UA 100 (*)    All other components within normal limits    EKG  EKG Interpretation None       Radiology No results found.  Procedures Procedures (including critical care time)  Medications Ordered in ED Medications  HYDROcodone-acetaminophen (NORCO/VICODIN) 5-325 MG per tablet 1 tablet (not administered)     Initial Impression / Assessment and Plan / ED Course  I have reviewed the triage vital signs and the nursing notes.  Pertinent labs & imaging results that were available during my care of the patient were reviewed by me and considered in my medical decision making (see chart for details).  Clinical Course     75 y.o. female here with back pain x1 day since lifting a box the day before onset of symptoms; states it's the same as her prior back pain issues, has some chronic thoracic back pain occasionally. Has seen her PCP and the ED in the past (last ED visit 03/2014 for same complaints, given norco and followed up with PCP). Tenderness is to R thoracic paraspinous muscles, with spasm palpable No red flag s/s of back pain aside from age. No s/s of central cord compression or cauda equina. Lower extremities are  neurovascularly intact and patient is ambulating without difficulty. U/A done in triage which was neg. Doubt other emergent process at play, such as dissection/etc, and doubt need for further emergent work up/imaging at this time. Pt states that she's used muscle relaxants in the past with no improvement; norco has helped in the past.   Patient was counseled on back pain precautions and told to do activity as tolerated but do not lift, push, or pull heavy objects more than 10 pounds for the next week. Patient counseled to use ice or heat on back for  no longer than 15 minutes every hour.   Rx given for NSAID (lower dose than typical). Rx given for narcotic pain medicine and counseled on proper use of narcotic pain medications. Told that they can increase to every 4 hrs if needed while pain is worse. Counseled not to combine this medication with others containing tylenol. Urged patient not to drink alcohol, drive, or perform any other activities that requires focus while this medication. Urged pt to continue the prednisone that was given to her at [facility].   Patient urged to follow-up with PCP in 3-4 days for recheck and if pain does not improve with treatment and rest or if pain becomes recurrent. Urged to return with worsening severe pain, loss of bowel or bladder control, trouble walking. The patient verbalizes understanding and agrees with the plan.   Final Clinical Impressions(s) / ED Diagnoses   Final diagnoses:  Acute right-sided thoracic back pain  Muscle spasm of back    New Prescriptions New Prescriptions   HYDROCODONE-ACETAMINOPHEN (NORCO) 5-325 MG TABLET    Take 1 tablet by mouth every 6 (six) hours as needed for severe pain.   NAPROXEN (NAPROSYN) 250 MG TABLET    Take 1 tablet (250 mg total) by mouth 2 (two) times daily with a meal. x2-3 days and then as needed thereafter     [employer], PA-C 12/01/15 2151    Sherwood Gambler, MD 12/08/15 [phone]

## 2015-12-03 ENCOUNTER — Telehealth: Payer: Self-pay | Admitting: Family Medicine

## 2015-12-03 ENCOUNTER — Encounter (HOSPITAL_COMMUNITY): Payer: Self-pay

## 2015-12-03 ENCOUNTER — Emergency Department (HOSPITAL_COMMUNITY)
Admission: EM | Admit: 2015-12-03 | Discharge: 2015-12-03 | Disposition: A | Discharge status: Home / Self Care | Payer: Medicare Other | Attending: Emergency Medicine | Admitting: Emergency Medicine

## 2015-12-03 DIAGNOSIS — Z87891 Personal history of nicotine dependence: Secondary | ICD-10-CM | POA: Insufficient documentation

## 2015-12-03 DIAGNOSIS — I10 Essential (primary) hypertension: Secondary | ICD-10-CM

## 2015-12-03 DIAGNOSIS — N189 Chronic kidney disease, unspecified: Secondary | ICD-10-CM | POA: Insufficient documentation

## 2015-12-03 DIAGNOSIS — I129 Hypertensive chronic kidney disease with stage 1 through stage 4 chronic kidney disease, or unspecified chronic kidney disease: Secondary | ICD-10-CM | POA: Insufficient documentation

## 2015-12-03 LAB — I-STAT CHEM 8, ED
BUN: 34 mg/dL — ABNORMAL HIGH (ref 6–20)
CREATININE: 1.3 mg/dL — AB (ref 0.44–1.00)
Calcium, Ion: 1.2 mmol/L (ref 1.15–1.40)
Chloride: 105 mmol/L (ref 101–111)
GLUCOSE: 95 mg/dL (ref 65–99)
HCT: 32 % — ABNORMAL LOW (ref 36.0–46.0)
HEMOGLOBIN: 10.9 g/dL — AB (ref 12.0–15.0)
Potassium: 4.2 mmol/L (ref 3.5–5.1)
Sodium: 140 mmol/L (ref 135–145)
TCO2: 28 mmol/L (ref 0–100)

## 2015-12-03 NOTE — ED Provider Notes (Signed)
[place] DEPT Provider Note   CSN: [id number] Arrival date & time: 12/03/15  1334     History   Chief Complaint Chief Complaint  Patient presents with  . Hypertension    HPI Madison Hernandez is a 75 y.o. female.  HPI Patient presents with high blood pressure. Seen in the ER 2 days ago for back pain. Had elevated blood pressure at that time and was told to follow-up with her primary care doctor. Reportedly blood pressure is elevated again today. Her face feels little flushed she has a dull headache. No numbness or weakness. She states that she called her doctor was told to go in the ER. No numbness or weakness. No chest pain. No trouble breathing. Has a history of hypertension. She is compliant with her medication. Previous hyponatremia and hydrochlorothiazide was stopped. Has not followed up due to money issues.   Past Medical History:  Diagnosis Date  . Acute ischemic colitis ([place]) 1/12  . Arthritis   . Bleeding ulcer    22 years ago  . Blood in stool   . Cardiac arrhythmia due to congenital heart disease   . Depression   . Diverticulitis   . Dyslipidemia   . Headache(784.0)   . Hyperlipidemia   . Hypertension   . Migraine   . UTI (urinary tract infection)     Patient Active Problem List   Diagnosis Date Noted  . Osteoarthritis, multiple sites 11/06/2013  . Chronic kidney disease 03/14/2012  . Hyperglycemia 02/14/2012  . Edema 07/21/2011  . Fatigue 01/21/2011  . History of depression 08/21/2010  . Acute ischemic colitis ([place]) 08/21/2010  . Migraine headache 08/21/2010  . Abnormal ECG 07/08/2010  . HTN (hypertension) 07/08/2010  . Mixed hyperlipidemia 07/08/2010  . Abnormal ECG 07/08/2010  . Palpitations 07/08/2010    Past Surgical History:  Procedure Laterality Date  . bleeding ulcers     22 years ago  . vein procedures     right and left lower extremity vein procedures    OB History    No data available       Home Medications    Prior  to Admission medications   Medication Sig Start Date End Date Taking? Authorizing Provider  benazepril (LOTENSIN) 20 MG tablet Take 1 tablet by mouth  daily 09/15/15  Yes Eulas Post, MD  HYDROcodone-acetaminophen (NORCO) 5-325 MG tablet Take 1 tablet by mouth every 6 (six) hours as needed for severe pain. 12/01/15  Yes Mercedes Camprubi-Soms, PA-C  metroNIDAZOLE (METROGEL) 0.75 % gel Apply 1 application topically daily as needed (rosacea).   Yes Historical Provider, MD  naproxen (NAPROSYN) 250 MG tablet Take 1 tablet (250 mg total) by mouth 2 (two) times daily with a meal. x2-3 days and then as needed thereafter Patient taking differently: Take 250 mg by mouth See admin instructions. Twice a day with meals x2-3 days and then twice a day as needed for pain thereafter. 12/01/15  Yes Mercedes Camprubi-Soms, PA-C  naproxen sodium (ANAPROX) 220 MG tablet Take 440 mg by mouth daily as needed (pain.).   Yes Historical Provider, MD  predniSONE (DELTASONE) 20 MG tablet Take 20 mg by mouth 2 (two) times daily with a meal.  12/01/15  Yes Historical Provider, MD  traMADol (ULTRAM) 50 MG tablet TAKE 1 TABLET BY MOUTH EVERY 6 HOURS AS NEEDED Patient not taking: Reported on 12/03/2015 07/25/15   Eulas Post, MD    Family History Family History  Problem Relation Age of Onset  .  Heart disease Mother     age 80  . Diabetes Mother   . Hypertension Mother   . Hypertension Sister     Social History Social History  Substance Use Topics  . Smoking status: Former Smoker    Packs/day: 2.00    Years: 10.00    Types: Cigarettes    Quit date: 01/19/1983  . Smokeless tobacco: Never Used  . Alcohol use No     Allergies   Avelox [moxifloxacin hcl in nacl]; Mucinex [guaifenesin er]; and Sulfa antibiotics   Review of Systems Review of Systems  Constitutional: Negative for activity change and appetite change.  Eyes: Negative for pain.  Respiratory: Negative for chest tightness and shortness of  breath.   Cardiovascular: Negative for chest pain and leg swelling.  Gastrointestinal: Negative for abdominal pain, diarrhea, nausea and vomiting.  Genitourinary: Negative for flank pain.  Musculoskeletal: Positive for back pain. Negative for neck stiffness.  Skin: Negative for rash.  Neurological: Positive for headaches. Negative for weakness and numbness.  Psychiatric/Behavioral: Negative for behavioral problems.     Physical Exam Updated Vital Signs BP 161/90 (BP Location: Left Arm)   Pulse 62   Temp 98 F (36.7 C) (Oral)   Resp 18   Wt 158 lb (71.7 kg)   SpO2 98%   BMI 25.50 kg/m   Physical Exam  Constitutional: She appears well-developed.  HENT:  Head: Atraumatic.  Eyes: Pupils are equal, round, and reactive to light.  Cardiovascular: Normal rate.   Pulmonary/Chest: Effort normal.  Abdominal: Soft.  Musculoskeletal: She exhibits no edema.  Neurological: She is alert.  Skin: Skin is warm. Capillary refill takes less than 2 seconds.     ED Treatments / Results  Labs (all labs ordered are listed, but only abnormal results are displayed) Labs Reviewed  I-STAT CHEM 8, ED - Abnormal; Notable for the following:       Result Value   BUN 34 (*)    Creatinine, Ser 1.30 (*)    Hemoglobin 10.9 (*)    HCT 32.0 (*)    All other components within normal limits    EKG  EKG Interpretation None       Radiology No results found.  Procedures Procedures (including critical care time)  Medications Ordered in ED Medications - No data to display   Initial Impression / Assessment and Plan / ED Course  I have reviewed the triage vital signs and the nursing notes.  Pertinent labs & imaging results that were available during my care of the patient were reviewed by me and considered in my medical decision making (see chart for details).  Clinical Course     Patient with hypertension. Moderate hypertension here but has improved without treatment down to 160/90.  Discussed the patient's primary care doctor, Dr. Elease Hashimoto. He will see the patient on Friday or one of his colleagues will see tomorrow. Will start new medicine at that time. She has had problems with a few different classes of medications.  Final Clinical Impressions(s) / ED Diagnoses   Final diagnoses:  Essential hypertension    New Prescriptions Discharge Medication List as of 12/03/2015  4:11 PM       Davonna Belling, MD 12/03/15 2317

## 2015-12-03 NOTE — Telephone Encounter (Signed)
[facility] - [facility] [place]  Patient Name: Madison Hernandez  DOB: [birth year]    Initial Comment Caller's BP shot up to 171/102. Went to ER for back spasms the night before last. Does have a headache and face is red.    Nurse Assessment  Nurse: Wayne Sever, RN, Tillie Rung Date/Time (Eastern Time): 12/03/2015 12:51:07 PM  Confirm and document reason for call. If symptomatic, describe symptoms. You must click the next button to save text entered. ---Caller states her BP is now 205/80. She wonders if her monitor is not working. She states her heart will skip a beat and then it will jump up. She states her BP is normally 140/70. She wants to know if she should change her medication.  Has the patient traveled out of the country within the last 30 days? ---Not Applicable  Does the patient have any new or worsening symptoms? ---Yes  Will a triage be completed? ---Yes  Related visit to physician within the last 2 weeks? ---No  Does the PT have any chronic conditions? (i.e. diabetes, asthma, etc.) ---Yes  List chronic conditions. ---HTN,  Is this a behavioral health or substance abuse call? ---No     Guidelines    Guideline Title Affirmed Question Affirmed Notes  High Blood Pressure [1] BP ? 160 / 100 AND [2] cardiac or neurologic symptoms (e.g., chest pain, difficulty breathing, unsteady gait, blurred vision)    Final Disposition User   Go to ED Now Wayne Sever, RN, [facility]    Referrals  [facility] - ED   Disagree/Comply: Comply

## 2015-12-03 NOTE — Telephone Encounter (Signed)
Pt has checked in to [facility] ED. Nothing further needed at this time.

## 2015-12-03 NOTE — ED Triage Notes (Signed)
She states that when she was seen here recently for back pain she was found to have had marked hypertension, her antihypertensive medication notwithstanding. She states we instructed her to follow up with her pcp for persistent hypertension, and when she called them they told her she would need to come back here for recheck before they would see her. Her only somatic c/o is of mild, generalized h/a. She is alert and oriented x 4 with clear speech.

## 2015-12-05 ENCOUNTER — Encounter: Payer: Self-pay | Admitting: Family Medicine

## 2015-12-05 ENCOUNTER — Ambulatory Visit (INDEPENDENT_AMBULATORY_CARE_PROVIDER_SITE_OTHER): Payer: Medicare Other | Admitting: Family Medicine

## 2015-12-05 VITALS — BP 180/80 | HR 90 | Temp 98.0°F | Ht 66.0 in | Wt 154.6 lb

## 2015-12-05 DIAGNOSIS — I10 Essential (primary) hypertension: Secondary | ICD-10-CM | POA: Diagnosis not present

## 2015-12-05 DIAGNOSIS — M546 Pain in thoracic spine: Secondary | ICD-10-CM

## 2015-12-05 DIAGNOSIS — G8929 Other chronic pain: Secondary | ICD-10-CM | POA: Diagnosis not present

## 2015-12-05 NOTE — Progress Notes (Signed)
Pre visit review using our clinic review tool, if applicable. No additional management support is needed unless otherwise documented below in the visit note. 

## 2015-12-05 NOTE — Patient Instructions (Signed)
Start back Amlodipine 5 mg once daily.

## 2015-12-05 NOTE — Progress Notes (Signed)
Subjective:     Patient ID: Madison Hernandez, female   DOB: 1940/08/03, 75 y.o.   MRN: [id number]  HPI Patient has long-standing history of intermittent back pain mostly involving thoracic region. She was seen twice this past week in the ER on the 13th and 15th. When she was seen on the 13th she was placed on Naproxen 250 mg twice daily and she states this has helped her back pain. She returned on the 17th with elevated blood pressure and flushing. Her history is that she had been apparently to urgent care center back prior to ER visit and presumably received steroid injection along with 3 days of oral prednisone. Since that time, she's had sensation of flushing and elevated blood pressures ranging generally ranging between 0000000 and [id number] systolic. She currently takes benazepril 20 mg once daily. She's had previous reported foot edema with amlodipine. She has sulfa allergy and also with taking HCTZ had hyponatremia.  No alcohol use. Nonsteroidal use as above with naproxen. Overall, her back pain is improved. No dysuria.  Past Medical History:  Diagnosis Date  . Acute ischemic colitis ([place]) 1/12  . Arthritis   . Bleeding ulcer    22 years ago  . Blood in stool   . Cardiac arrhythmia due to congenital heart disease   . Depression   . Diverticulitis   . Dyslipidemia   . Headache(784.0)   . Hyperlipidemia   . Hypertension   . Migraine   . UTI (urinary tract infection)    Past Surgical History:  Procedure Laterality Date  . bleeding ulcers     22 years ago  . vein procedures     right and left lower extremity vein procedures    reports that she quit smoking about 32 years ago. Her smoking use included Cigarettes. She has a 20.00 pack-year smoking history. She has never used smokeless tobacco. She reports that she does not drink alcohol or use drugs. family history includes Diabetes in her mother; Heart disease in her mother; Hypertension in her mother and sister. Allergies  Allergen  Reactions  . Avelox [Moxifloxacin Hcl In Nacl] Shortness Of Breath  . Mucinex [Guaifenesin Er]     "Makes my heart flutter"  . Sulfa Antibiotics     hives     Review of Systems  Constitutional: Negative for fatigue.  Eyes: Negative for visual disturbance.  Respiratory: Negative for cough, chest tightness, shortness of breath and wheezing.   Cardiovascular: Negative for chest pain, palpitations and leg swelling.  Musculoskeletal: Positive for back pain.  Neurological: Negative for dizziness, seizures, syncope, weakness, light-headedness and headaches.       Objective:   Physical Exam  Constitutional: She appears well-developed and well-nourished.  Eyes: Pupils are equal, round, and reactive to light.  Neck: Neck supple. No JVD present. No thyromegaly present.  Cardiovascular: Normal rate and regular rhythm.  Exam reveals no gallop.   Pulmonary/Chest: Effort normal and breath sounds normal. No respiratory distress. She has no wheezes. She has no rales.  Musculoskeletal: She exhibits no edema.  Neurological: She is alert. No cranial nerve deficit.  Skin: No rash noted.       Assessment:     #1 hypertension. Poorly controlled. Previous hyponatremia with HCTZ.  Blood pressure possibly exacerbated by steroids and naproxen  #2 chronic intermittent back pain    Plan:     -continue benazepril -We discussed additional medication options. She cannot take HCTZ is above Question of mild foot edema with Amlodipine  is willing to consider this again. We also discussed option of hydralazine if not tolerating CCB. Start amlodipine 5 mg once daily.  Follow up early next week here in the office -Avoid non-steroidals as much as possible.  Eulas Post MD [facility]

## 2015-12-08 DIAGNOSIS — K5900 Constipation, unspecified: Secondary | ICD-10-CM | POA: Diagnosis not present

## 2015-12-08 DIAGNOSIS — K5730 Diverticulosis of large intestine without perforation or abscess without bleeding: Secondary | ICD-10-CM | POA: Diagnosis not present

## 2015-12-08 DIAGNOSIS — R1032 Left lower quadrant pain: Secondary | ICD-10-CM | POA: Diagnosis not present

## 2015-12-09 ENCOUNTER — Ambulatory Visit (INDEPENDENT_AMBULATORY_CARE_PROVIDER_SITE_OTHER): Payer: Medicare Other | Admitting: Family Medicine

## 2015-12-09 VITALS — BP 130/60 | HR 85 | Temp 97.8°F | Ht 66.0 in | Wt 152.0 lb

## 2015-12-09 DIAGNOSIS — I10 Essential (primary) hypertension: Secondary | ICD-10-CM

## 2015-12-09 MED ORDER — AMLODIPINE BESYLATE 5 MG PO TABS: 5.0000 mg | ORAL_TABLET | Freq: Every day | ORAL | 3 refills | Status: DC

## 2015-12-09 NOTE — Progress Notes (Signed)
Subjective:     Patient ID: Madison Hernandez, female   DOB: 1940/12/11, 75 y.o.   MRN: [id number]  HPI Patient here for follow-up hypertension. Refer to recent note. She had several especially elevated systolic readings [id number] here. She was already taking benazepril regularly. Prior history of hyponatremia with hydrochlorothiazide. Reported history of edema with amlodipine but we cautiously started back amlodipine 5 mg daily. She has tolerated well without side effect. Her blood pressures are greatly improved. No headaches. No dizziness.  Unfortunately she has developed some lower abdominal pain and saw GI yesterday and started on Flagyl and Cipro for presumed acute diverticulitis. She's had some nausea today without vomiting but no fevers or chills. She has contacted GI office regarding her side effects.  Past Medical History:  Diagnosis Date  . Acute ischemic colitis ([place]) 1/12  . Arthritis   . Bleeding ulcer    22 years ago  . Blood in stool   . Cardiac arrhythmia due to congenital heart disease   . Depression   . Diverticulitis   . Dyslipidemia   . Headache(784.0)   . Hyperlipidemia   . Hypertension   . Migraine   . UTI (urinary tract infection)    Past Surgical History:  Procedure Laterality Date  . bleeding ulcers     22 years ago  . vein procedures     right and left lower extremity vein procedures    reports that she quit smoking about 32 years ago. Her smoking use included Cigarettes. She has a 20.00 pack-year smoking history. She has never used smokeless tobacco. She reports that she does not drink alcohol or use drugs. family history includes Diabetes in her mother; Heart disease in her mother; Hypertension in her mother and sister. Allergies  Allergen Reactions  . Avelox [Moxifloxacin Hcl In Nacl] Shortness Of Breath  . Mucinex [Guaifenesin Er]     "Makes my heart flutter"  . Sulfa Antibiotics     hives     Review of Systems  Constitutional: Negative for fatigue  and unexpected weight change.  Eyes: Negative for visual disturbance.  Respiratory: Negative for cough, chest tightness, shortness of breath and wheezing.   Cardiovascular: Negative for chest pain, palpitations and leg swelling.  Endocrine: Negative for polydipsia and polyuria.  Neurological: Negative for dizziness, seizures, syncope, weakness, light-headedness and headaches.       Objective:   Physical Exam  Constitutional: She appears well-developed and well-nourished.  Eyes: Pupils are equal, round, and reactive to light.  Neck: Neck supple. No JVD present. No thyromegaly present.  Cardiovascular: Normal rate and regular rhythm.  Exam reveals no gallop.   Pulmonary/Chest: Effort normal and breath sounds normal. No respiratory distress. She has no wheezes. She has no rales.  Musculoskeletal: She exhibits no edema.  Neurological: She is alert.       Assessment:     Hypertension. Greatly improved with recent addition of amlodipine    Plan:     -Continue benazepril and amlodipine -Monitor blood pressure and be in touch if consistently greater than 140/90 -Routine follow-up in 3 months and reassess then.  Eulas Post MD [facility] at [facility]

## 2015-12-09 NOTE — Progress Notes (Signed)
Pre visit review using our clinic review tool, if applicable. No additional management support is needed unless otherwise documented below in the visit note. 

## 2015-12-17 DIAGNOSIS — J029 Acute pharyngitis, unspecified: Secondary | ICD-10-CM | POA: Diagnosis not present

## 2015-12-31 ENCOUNTER — Telehealth: Payer: Self-pay | Admitting: Family Medicine

## 2015-12-31 NOTE — Telephone Encounter (Signed)
° ° ° ° °  Pt request refill of the following:  naproxen (NAPROSYN) 250 MG tablet  Pt request 90 day supply    Phamacy:   [facility]

## 2016-01-01 MED ORDER — NAPROXEN 250 MG PO TABS: 250.0000 mg | ORAL_TABLET | Freq: Two times a day (BID) | ORAL | 2 refills | Status: DC

## 2016-01-01 NOTE — Telephone Encounter (Signed)
Medication sent into pharmacy  

## 2016-01-05 DIAGNOSIS — K5730 Diverticulosis of large intestine without perforation or abscess without bleeding: Secondary | ICD-10-CM | POA: Diagnosis not present

## 2016-01-05 DIAGNOSIS — K5900 Constipation, unspecified: Secondary | ICD-10-CM | POA: Diagnosis not present

## 2016-01-05 DIAGNOSIS — R1032 Left lower quadrant pain: Secondary | ICD-10-CM | POA: Diagnosis not present

## 2016-02-03 ENCOUNTER — Ambulatory Visit (INDEPENDENT_AMBULATORY_CARE_PROVIDER_SITE_OTHER): Payer: Medicare PPO | Admitting: Family Medicine

## 2016-02-03 VITALS — BP 140/68 | HR 95 | Temp 98.0°F | Ht 66.0 in | Wt 153.0 lb

## 2016-02-03 DIAGNOSIS — M546 Pain in thoracic spine: Secondary | ICD-10-CM | POA: Diagnosis not present

## 2016-02-03 MED ORDER — HYDROCHLOROTHIAZIDE 25 MG PO TABS: 25.0000 mg | ORAL_TABLET | Freq: Every day | ORAL | 1 refills | Status: DC

## 2016-02-03 MED ORDER — HYDROCODONE-ACETAMINOPHEN 5-325 MG PO TABS: 1.0000 | ORAL_TABLET | Freq: Four times a day (QID) | ORAL | 0 refills | Status: DC | PRN

## 2016-02-03 NOTE — Patient Instructions (Signed)
Go for thoracic back X-ray as discussed.

## 2016-02-03 NOTE — Progress Notes (Signed)
Subjective:     Patient ID: Madison Hernandez, female   DOB: [birth year], 76 y.o.   MRN: [id number]  HPI Patient seen with a chronic recurring problem which is mid thoracic back pain. She states she's had at least 10 years of intermittent symptoms which are sometimes right sided and sometimes left-sided. Current symptoms are mostly left side just left of the spine around the paraspinous muscle region below the scapula. Recent symptoms started couple days ago. Worse with movement.  No injury  She describes a burning type pain which is sometimes up to 10 out of 10 severity. Denies any fever, chills, cough, pleuritic pain, or skin rash.   She took naproxen without relief. Heat without relief. Ice provided minimal relief. She's taken hydrocodone for flareups in the past. She has not gotten relief with other medications previously. She denies any recent appetite or weight changes. No dyspnea.  She states she is very frustrated with the fact this has been chronic and intermittent and she is very interested in finding the cause  Past Medical History:  Diagnosis Date  . Acute ischemic colitis ([place]) 1/12  . Arthritis   . Bleeding ulcer    22 years ago  . Blood in stool   . Cardiac arrhythmia due to congenital heart disease   . Depression   . Diverticulitis   . Dyslipidemia   . Headache(784.0)   . Hyperlipidemia   . Hypertension   . Migraine   . UTI (urinary tract infection)    Past Surgical History:  Procedure Laterality Date  . bleeding ulcers     22 years ago  . vein procedures     right and left lower extremity vein procedures    reports that she quit smoking about 33 years ago. Her smoking use included Cigarettes. She has a 20.00 pack-year smoking history. She has never used smokeless tobacco. She reports that she does not drink alcohol or use drugs. family history includes Diabetes in her mother; Heart disease in her mother; Hypertension in her mother and sister. Allergies  Allergen  Reactions  . Avelox [Moxifloxacin Hcl In Nacl] Shortness Of Breath  . Mucinex [Guaifenesin Er]     "Makes my heart flutter"  . Sulfa Antibiotics     hives     Review of Systems  Constitutional: Negative for appetite change, chills, fever and unexpected weight change.  Respiratory: Negative for cough and shortness of breath.   Cardiovascular: Negative for chest pain.  Gastrointestinal: Negative for abdominal pain.  Musculoskeletal: Positive for back pain.  Skin: Negative for rash.  Neurological: Negative for dizziness.  Hematological: Negative for adenopathy.       Objective:   Physical Exam  Constitutional: She appears well-developed and well-nourished.  Cardiovascular: Normal rate and regular rhythm.   Pulmonary/Chest: Effort normal and breath sounds normal. No respiratory distress. She has no wheezes. She has no rales.  Musculoskeletal:  No spinal tenderness. She has some paraspinous muscle tenderness just left of the thoracic spine around T8 level. No visible swelling, erythema, or ecchymosis  Skin: No rash noted.       Assessment:     Chronic intermittent thoracic back pain. Fact this has been 10 years intermittently suggest low likelihood of worrisome etiologies such as myeloma, etc.    Plan:     -Check labs with CBC, comprehensive metabolic panel, sedimentation rate -Plain films thoracic spine -If all the above normal consider sports medicine referral  Eulas Post MD [facility] at  [facility]

## 2016-02-03 NOTE — Progress Notes (Signed)
Pre visit review using our clinic review tool, if applicable. No additional management support is needed unless otherwise documented below in the visit note. 

## 2016-02-06 ENCOUNTER — Ambulatory Visit (INDEPENDENT_AMBULATORY_CARE_PROVIDER_SITE_OTHER)
Admission: RE | Admit: 2016-02-06 | Discharge: 2016-02-06 | Disposition: A | Discharge status: Home / Self Care | Payer: Medicare PPO | Source: Ambulatory Visit | Attending: Family Medicine | Admitting: Family Medicine

## 2016-02-06 DIAGNOSIS — M546 Pain in thoracic spine: Secondary | ICD-10-CM | POA: Diagnosis not present

## 2016-02-06 LAB — CBC WITH DIFFERENTIAL/PLATELET
Basophils Absolute: 0 10*3/uL (ref 0.0–0.1)
Basophils Relative: 0.4 % (ref 0.0–3.0)
EOS ABS: 0.3 10*3/uL (ref 0.0–0.7)
EOS PCT: 4.2 % (ref 0.0–5.0)
HCT: 35.8 % — ABNORMAL LOW (ref 36.0–46.0)
HEMOGLOBIN: 12 g/dL (ref 12.0–15.0)
Lymphocytes Relative: 27.6 % (ref 12.0–46.0)
Lymphs Abs: 2 10*3/uL (ref 0.7–4.0)
MCHC: 33.4 g/dL (ref 30.0–36.0)
MCV: 87.2 fl (ref 78.0–100.0)
MONO ABS: 0.4 10*3/uL (ref 0.1–1.0)
Monocytes Relative: 6.1 % (ref 3.0–12.0)
Neutro Abs: 4.4 10*3/uL (ref 1.4–7.7)
Neutrophils Relative %: 61.7 % (ref 43.0–77.0)
Platelets: 299 10*3/uL (ref 150.0–400.0)
RBC: 4.11 Mil/uL (ref 3.87–5.11)
RDW: 13.6 % (ref 11.5–15.5)
WBC: 7.1 10*3/uL (ref 4.0–10.5)

## 2016-02-06 LAB — COMPREHENSIVE METABOLIC PANEL
ALK PHOS: 83 U/L (ref 39–117)
ALT: 11 U/L (ref 0–35)
AST: 12 U/L (ref 0–37)
Albumin: 4.6 g/dL (ref 3.5–5.2)
BILIRUBIN TOTAL: 0.3 mg/dL (ref 0.2–1.2)
BUN: 19 mg/dL (ref 6–23)
CO2: 24 mEq/L (ref 19–32)
CREATININE: 1.28 mg/dL — AB (ref 0.40–1.20)
Calcium: 10 mg/dL (ref 8.4–10.5)
Chloride: 103 mEq/L (ref 96–112)
GFR: 43.34 mL/min — ABNORMAL LOW (ref >60.00)
GLUCOSE: 96 mg/dL (ref 70–99)
Potassium: 4.3 mEq/L (ref 3.5–5.1)
SODIUM: 137 meq/L (ref 135–145)
TOTAL PROTEIN: 7.9 g/dL (ref 6.0–8.3)

## 2016-02-06 LAB — SEDIMENTATION RATE: SED RATE: 25 mm/h (ref 0–30)

## 2016-02-11 ENCOUNTER — Telehealth: Payer: Self-pay | Admitting: Family Medicine

## 2016-02-11 DIAGNOSIS — M546 Pain in thoracic spine: Secondary | ICD-10-CM

## 2016-02-11 DIAGNOSIS — M159 Polyosteoarthritis, unspecified: Secondary | ICD-10-CM

## 2016-02-11 NOTE — Telephone Encounter (Signed)
My recommendation is to see Charlann Boxer for her chronic thoracic back pain to see if he has any input before starting additional medications.

## 2016-02-11 NOTE — Telephone Encounter (Signed)
Pt was seen on 02-03-16 and oxycodone is not working for her back. Pt would like something for nerve pain in her back call into [facility]

## 2016-02-12 NOTE — Telephone Encounter (Signed)
Order entered and pt is aware.

## 2016-02-13 ENCOUNTER — Telehealth: Payer: Self-pay | Admitting: Family Medicine

## 2016-02-13 NOTE — Telephone Encounter (Signed)
° ° ° ° °  Pt call to say she is out of the below med and is asking if it can be refilled cause she does not see the neuro doctor till February 9 the .She is asking for a refill    HYDROcodone-acetaminophen (NORCO) 5-325 MG tablet

## 2016-02-13 NOTE — Telephone Encounter (Signed)
Last refill 02-03-2016 #30 Please advise

## 2016-02-15 NOTE — Telephone Encounter (Signed)
Refill once 

## 2016-02-16 MED ORDER — HYDROCODONE-ACETAMINOPHEN 5-325 MG PO TABS: 1.0000 | ORAL_TABLET | Freq: Four times a day (QID) | ORAL | 0 refills | Status: DC | PRN

## 2016-02-16 NOTE — Telephone Encounter (Signed)
Printed for signature

## 2016-02-16 NOTE — Telephone Encounter (Signed)
Pt is aware via voicemail that script is up front for pick up. 

## 2016-03-17 ENCOUNTER — Other Ambulatory Visit: Payer: Self-pay

## 2016-03-17 MED ORDER — BENAZEPRIL HCL 20 MG PO TABS: 20.0000 mg | ORAL_TABLET | Freq: Every day | ORAL | 3 refills | Status: DC

## 2016-03-17 MED ORDER — HYDROCHLOROTHIAZIDE 25 MG PO TABS: 25.0000 mg | ORAL_TABLET | Freq: Every day | ORAL | 3 refills | Status: DC

## 2016-03-17 MED ORDER — AMLODIPINE BESYLATE 5 MG PO TABS: 5.0000 mg | ORAL_TABLET | Freq: Every day | ORAL | 3 refills | Status: DC

## 2016-06-07 ENCOUNTER — Emergency Department (HOSPITAL_COMMUNITY)
Admission: EM | Admit: 2016-06-07 | Discharge: 2016-06-07 | Disposition: A | Discharge status: Home / Self Care | Payer: Medicare PPO | Attending: Emergency Medicine | Admitting: Emergency Medicine

## 2016-06-07 ENCOUNTER — Encounter (HOSPITAL_COMMUNITY): Payer: Self-pay

## 2016-06-07 DIAGNOSIS — N189 Chronic kidney disease, unspecified: Secondary | ICD-10-CM | POA: Diagnosis not present

## 2016-06-07 DIAGNOSIS — Z79899 Other long term (current) drug therapy: Secondary | ICD-10-CM | POA: Insufficient documentation

## 2016-06-07 DIAGNOSIS — I129 Hypertensive chronic kidney disease with stage 1 through stage 4 chronic kidney disease, or unspecified chronic kidney disease: Secondary | ICD-10-CM | POA: Diagnosis not present

## 2016-06-07 DIAGNOSIS — Z87891 Personal history of nicotine dependence: Secondary | ICD-10-CM | POA: Insufficient documentation

## 2016-06-07 DIAGNOSIS — M546 Pain in thoracic spine: Secondary | ICD-10-CM | POA: Insufficient documentation

## 2016-06-07 DIAGNOSIS — M549 Dorsalgia, unspecified: Secondary | ICD-10-CM | POA: Diagnosis present

## 2016-06-07 MED ORDER — HYDROCODONE-ACETAMINOPHEN 5-325 MG PO TABS: 1.0000 | ORAL_TABLET | ORAL | 0 refills | Status: DC | PRN

## 2016-06-07 MED ORDER — HYDROCODONE-ACETAMINOPHEN 5-325 MG PO TABS
1.0000 | ORAL_TABLET | Freq: Once | ORAL | Status: AC
  Administered 2016-06-07: 1 via ORAL
  Filled 2016-06-07: qty 1

## 2016-06-07 MED ORDER — KETOROLAC TROMETHAMINE 15 MG/ML IJ SOLN
15.0000 mg | Freq: Once | INTRAMUSCULAR | Status: AC
  Administered 2016-06-07: 15 mg via INTRAMUSCULAR
  Filled 2016-06-07: qty 1

## 2016-06-07 NOTE — Discharge Instructions (Signed)
Please read attached information. If you experience any new or worsening signs or symptoms please return to the emergency room for evaluation. Please follow-up with your primary care provider or specialist as discussed. Please use medication prescribed only as directed and discontinue taking if you have any concerning signs or symptoms.  Please do not drink or drive while taking pain medicine.

## 2016-06-07 NOTE — ED Provider Notes (Signed)
[facility] DEPT Provider Note   CSN: [id number] Arrival date & time: 06/07/16  0021  By signing my name below, I, Mayer Masker, attest that this documentation has been prepared under the direction and in the presence of [employer], PA-C. Electronically Signed: Mayer Masker, Scribe. 06/07/16. 1:01 AM.  History   Chief Complaint Chief Complaint  Patient presents with  . Back Pain   The history is provided by the patient. No language interpreter was used.   HPI Comments: Madison Hernandez is a 77 y.o. female with a PMHx of back fracture who presents to the Emergency Department complaining of chronic right-sided back pain that she describes as a burning. She states the pain is worse when she moves around. She uses a heating pad and takes a hot shower for some pain relief. She takes aleve with no relief. She has had this pain previously before. She notes she has an appointment on June 26 with her neurosurgeon, but her pain returned before she could see him, so she came to the ED. She denies numbness/tingling and fever.  Past Medical History:  Diagnosis Date  . Acute ischemic colitis ([place]) 1/12  . Arthritis   . Bleeding ulcer    22 years ago  . Blood in stool   . Cardiac arrhythmia due to congenital heart disease   . Depression   . Diverticulitis   . Dyslipidemia   . Headache(784.0)   . Hyperlipidemia   . Hypertension   . Migraine   . UTI (urinary tract infection)     Patient Active Problem List   Diagnosis Date Noted  . Osteoarthritis, multiple sites 11/06/2013  . Chronic kidney disease 03/14/2012  . Hyperglycemia 02/14/2012  . Edema 07/21/2011  . Fatigue 01/21/2011  . History of depression 08/21/2010  . Acute ischemic colitis ([place]) 08/21/2010  . Migraine headache 08/21/2010  . Abnormal ECG 07/08/2010  . HTN (hypertension) 07/08/2010  . Mixed hyperlipidemia 07/08/2010  . Abnormal ECG 07/08/2010  . Palpitations 07/08/2010    Past Surgical History:  Procedure  Laterality Date  . bleeding ulcers     22 years ago  . vein procedures     right and left lower extremity vein procedures    OB History    No data available       Home Medications    Prior to Admission medications   Medication Sig Start Date End Date Taking? Authorizing Provider  amLODipine (NORVASC) 5 MG tablet Take 1 tablet (5 mg total) by mouth daily. 03/17/16   Burchette, Alinda Sierras, MD  benazepril (LOTENSIN) 20 MG tablet Take 1 tablet (20 mg total) by mouth daily. 03/17/16   Burchette, Alinda Sierras, MD  hydrochlorothiazide (HYDRODIURIL) 25 MG tablet Take 1 tablet (25 mg total) by mouth daily. 03/17/16   Burchette, Alinda Sierras, MD  HYDROcodone-acetaminophen (NORCO/VICODIN) 5-325 MG tablet Take 1-2 tablets by mouth every 4 (four) hours as needed. 06/07/16   Breiona Couvillon, Dellis Filbert, PA-C  Linaclotide (LINZESS PO) Take by mouth.    [provider]  metroNIDAZOLE (METROGEL) 0.75 % gel Apply 1 application topically daily as needed (rosacea).    [provider]  naproxen (NAPROSYN) 250 MG tablet Take 1 tablet (250 mg total) by mouth 2 (two) times daily with a meal. 01/01/16   Burchette, Alinda Sierras, MD  pantoprazole (PROTONIX) 40 MG tablet  01/27/16   [provider]    Family History Family History  Problem Relation Age of Onset  . Heart disease Mother  age 18  . Diabetes Mother   . Hypertension Mother   . Hypertension Sister     Social History Social History  Substance Use Topics  . Smoking status: Former Smoker    Packs/day: 2.00    Years: 10.00    Types: Cigarettes    Quit date: 01/19/1983  . Smokeless tobacco: Never Used  . Alcohol use No     Allergies   Avelox [moxifloxacin hcl in nacl]; Mucinex [guaifenesin er]; and Sulfa antibiotics   Review of Systems Review of Systems  Constitutional: Negative for fever.  Musculoskeletal: Positive for myalgias.  Neurological: Negative for numbness.     Physical Exam Updated Vital Signs BP (!) 186/71 (BP  Location: Right Arm)   Pulse 80   Temp 97.5 F (36.4 C) (Oral)   Resp 14   SpO2 95%   Physical Exam  Constitutional: She is oriented to person, place, and time. She appears well-developed and well-nourished.  HENT:  Head: Normocephalic and atraumatic.  Cardiovascular: Normal rate.   Pulmonary/Chest: Effort normal.  Musculoskeletal:  No C,T,L spine TTP Acute TTP of right lateral thoracic musculature Distal sensation, strength, and motor function intact Straight leg negative  Neurological: She is alert and oriented to person, place, and time.  Skin: Skin is warm and dry.  Psychiatric: She has a normal mood and affect.  Nursing note and vitals reviewed.    ED Treatments / Results  DIAGNOSTIC STUDIES: Oxygen Saturation is 99% on RA, normal by my interpretation.    COORDINATION OF CARE: 12:59 AM Discussed treatment plan with pt at bedside and pt agreed to plan.  Labs (all labs ordered are listed, but only abnormal results are displayed) Labs Reviewed - No data to display  EKG  EKG Interpretation None       Radiology No results found.  Procedures Procedures (including critical care time)  Medications Ordered in ED Medications  ketorolac (TORADOL) 15 MG/ML injection 15 mg (15 mg Intramuscular Given 06/07/16 0109)  HYDROcodone-acetaminophen (NORCO/VICODIN) 5-325 MG per tablet 1 tablet (1 tablet Oral Given 06/07/16 0213)     Initial Impression / Assessment and Plan / ED Course  I have reviewed the triage vital signs and the nursing notes.  Pertinent labs & imaging results that were available during my care of the patient were reviewed by me and considered in my medical decision making (see chart for details).      Final Clinical Impressions(s) / ED Diagnoses   Final diagnoses:  Acute right-sided thoracic back pain    76 year old female presents today with back pain. This is identical to previous episodes. Patient has had imaging studies in the past which  showed no significant nausea of her thoracic pain. She reports she's being followed by Dr. Arnoldo Morale who believes this is coming from her cervical spine. She has no red flags on exam today, the identical pain to previous. She was treated with Toradol with no improvement in his symptoms. Patient discharged home with short course of Norco for pain management while awaiting follow-up with either primary care or neurosurgery. Patient given strict return impressions, she verbalized understanding and agreement to today's plan had no further questions or concerns at time of discharge.  New Prescriptions Discharge Medication List as of 06/07/2016  2:05 AM      I personally performed the services described in this documentation, which was scribed in my presence. The recorded information has been reviewed and is accurate.    Okey Regal, PA-C 06/07/16 [phone]  Ripley Fraise, MD 06/07/16 [phone]

## 2016-06-07 NOTE — ED Triage Notes (Signed)
Right thorciac pain states has had MRI for same pain before states pain 10/10 nothing makes pain worse.

## 2016-06-16 ENCOUNTER — Telehealth: Payer: Self-pay

## 2016-06-16 NOTE — Telephone Encounter (Signed)
Called patient back and made her an appointment with Dr. Johnsie Cancel.

## 2016-06-16 NOTE — Telephone Encounter (Signed)
Follow Up Call :  Madison Hernandez is returning your call . Thanks

## 2016-06-16 NOTE — Telephone Encounter (Signed)
Called patient about fax received from [facility] Specialist:   Dr. Vista Lawman is recommended patient to have a consult with Dr. Johnsie Cancel and EKG. Reason for visit Patient is "76 year old female with mild fatigue and SOB. Cardio exam- irregularly, irregular rhythm by auscultation, possible A. Fib or other arrhythmia."  Left message for patient to call back, so an appointment can be scheduled.

## 2016-06-17 ENCOUNTER — Ambulatory Visit (INDEPENDENT_AMBULATORY_CARE_PROVIDER_SITE_OTHER): Payer: Medicare PPO | Admitting: Cardiovascular Disease

## 2016-06-17 ENCOUNTER — Encounter (INDEPENDENT_AMBULATORY_CARE_PROVIDER_SITE_OTHER): Payer: Self-pay

## 2016-06-17 ENCOUNTER — Encounter: Payer: Self-pay | Admitting: Cardiovascular Disease

## 2016-06-17 ENCOUNTER — Ambulatory Visit (INDEPENDENT_AMBULATORY_CARE_PROVIDER_SITE_OTHER): Payer: Medicare PPO

## 2016-06-17 VITALS — BP 156/60 | HR 64 | Ht 66.0 in | Wt 158.6 lb

## 2016-06-17 DIAGNOSIS — Z7689 Persons encountering health services in other specified circumstances: Secondary | ICD-10-CM | POA: Diagnosis not present

## 2016-06-17 DIAGNOSIS — I493 Ventricular premature depolarization: Secondary | ICD-10-CM

## 2016-06-17 NOTE — Patient Instructions (Addendum)
   Medication Instructions:   Your physician recommends that you continue on your current medications as directed. Please refer to the Current Medication list given to you today.    Testing/Procedures:  Your physician has recommended that you wear a 48 HOUR holter monitor. Holter monitors are medical devices that record the heart's electrical activity. Doctors most often use these monitors to diagnose arrhythmias. Arrhythmias are problems with the speed or rhythm of the heartbeat. The monitor is a small, portable device. You can wear one while you do your normal daily activities. This is usually used to diagnose what is causing palpitations/syncope (passing out).   Your physician has requested that you have an echocardiogram. Echocardiography is a painless test that uses sound waves to create images of your heart. It provides your doctor with information about the size and shape of your heart and how well your heart's chambers and valves are working. This procedure takes approximately one hour. There are no restrictions for this procedure.     Follow-Up:  Your physician wants you to follow-up in: [place] will receive a reminder letter in the mail two months in advance. If you don't receive a letter, please call our office to schedule the follow-up appointment.        If you need a refill on your cardiac medications before your next appointment, please call your pharmacy.

## 2016-06-17 NOTE — Progress Notes (Signed)
Cardiology Office Note   Date:  06/17/2016   ID:  Madison Hernandez, DOB 08/05/1940, MRN [id number]  PCP:  Eulas Post, MD  Cardiologist:   Jenkins Rouge, MD   Chief Complaint  Patient presents with  . Establish Care  . Possible Afib    Consult      History of Present Illness: Madison Hernandez is a 76 y.o. female who presents for evaluation of possible afib. CRF;s include HTN She is referred by Dr Elease Hashimoto. She was actually seen by Dr Jones Skene vein specialist for left calf pain And he felt her rhythm was irregular by auscultation. She had left lower extremity edema with no stasis  He ordered a duplex. She has had back pain for a year Has an appt with Dr Arnoldo Morale with MRI Does not Have SSCP, palpitations or dyspnea She indicates long standing skips and more rapid HR when her back hurts She lives alone has a daughter in [place] and sister in [place] No excess ETOH or caffeine     Past Medical History:  Diagnosis Date  . Acute ischemic colitis ([place]) 1/12  . Arthritis   . Bleeding ulcer    22 years ago  . Blood in stool   . Cardiac arrhythmia due to congenital heart disease   . Depression   . Diverticulitis   . Dyslipidemia   . Headache(784.0)   . Hyperlipidemia   . Hypertension   . Migraine   . UTI (urinary tract infection)     Past Surgical History:  Procedure Laterality Date  . bleeding ulcers     22 years ago  . vein procedures     right and left lower extremity vein procedures     Current Outpatient Prescriptions  Medication Sig Dispense Refill  . amLODipine (NORVASC) 5 MG tablet Take 1 tablet (5 mg total) by mouth daily. 90 tablet 3  . benazepril (LOTENSIN) 20 MG tablet Take 1 tablet (20 mg total) by mouth daily. 90 tablet 3   No current facility-administered medications for this visit.     Allergies:   Avelox [moxifloxacin hcl in nacl]; Mucinex [guaifenesin er]; and Sulfa antibiotics    Social History:  The patient  reports that she quit  smoking about 33 years ago. Her smoking use included Cigarettes. She has a 20.00 pack-year smoking history. She has never used smokeless tobacco. She reports that she does not drink alcohol or use drugs.   Family History:  The patient's family history includes Diabetes in her mother; Heart disease in her mother; Hypertension in her mother and sister.    ROS:  Please see the history of present illness.   Otherwise, review of systems are positive for none.   All other systems are reviewed and negative.    PHYSICAL EXAM: VS:  BP (!) 156/60   Pulse 64   Ht 5\' 6"  (1.676 m)   Wt 158 lb 9.6 oz (71.9 kg)   SpO2 98%   BMI 25.60 kg/m  , BMI Body mass index is 25.6 kg/m. Affect appropriate Healthy:  appears stated age 18: normal Neck supple with no adenopathy JVP normal no bruits no thyromegaly Lungs clear with no wheezing and good diaphragmatic motion Heart:  S1/S2 no murmur, no rub, gallop or click PMI normal Abdomen: benighn, BS positve, no tenderness, no AAA no bruit.  No HSM or HJR Distal pulses intact with no bruits Plus on LLE  edema Neuro non-focal Skin warm and dry No muscular weakness  EKG:   06/17/16  SR rate 81 PVC's otherwise normal QT 390    Recent Labs: 02/03/2016: ALT 11; BUN 19; Creatinine, Ser 1.28; Hemoglobin 12.0; Platelets 299.0; Potassium 4.3; Sodium 137    Lipid Panel    Component Value Date/Time   CHOL 262 (H) 11/06/2013 0850   TRIG 313.0 (H) 11/06/2013 0850   HDL 56.70 11/06/2013 0850   CHOLHDL 5 11/06/2013 0850   VLDL 62.6 (H) 11/06/2013 0850   LDLCALC 142 (H) 04/05/2013 1355   LDLDIRECT 169.4 11/06/2013 0850      Wt Readings from Last 3 Encounters:  06/17/16 158 lb 9.6 oz (71.9 kg)  02/03/16 153 lb (69.4 kg)  12/09/15 152 lb (68.9 kg)      Other studies Reviewed: Additional studies/ records that were reviewed today include: notes Dr Jones Skene [facility].    ASSESSMENT AND PLAN:  1. Arrhythmia :  No afib seems more like chronic  asymptomatic unifocal PVCls. Will order echo and 48 hour holter to further assess  2. HTN  Continue current meds and low sodium diet 3. Venous:  F/u Dr Jones Skene for duplex results likely insufficiency of saphenous on left    Current medicines are reviewed at length with the patient today.  The patient does not have concerns regarding medicines.  The following changes have been made:  no change  Labs/ tests ordered today include: Holter Echo   Orders Placed This Encounter  Procedures  . Holter monitor - 48 hour  . EKG 12-Lead  . ECHOCARDIOGRAM COMPLETE     Disposition:   FU with me in a year      Signed, Jenkins Rouge, MD  06/17/2016 8:49 AM    [facility] [address], [place], [place]  [address] Phone: [phone]; Fax: [phone]

## 2016-06-19 DIAGNOSIS — I493 Ventricular premature depolarization: Secondary | ICD-10-CM

## 2016-06-22 ENCOUNTER — Telehealth: Payer: Self-pay | Admitting: Cardiovascular Disease

## 2016-06-22 NOTE — Telephone Encounter (Signed)
Encouraged patient to keep her appointment. Informed patient that during her echo she will be on a cushioned table and they can use pillows to give her some relief with her back during test. Patient stated her BP was getting better, due to her back pain easing off. BP 149/82 HR 80's. Encourage patient to call PCP about her back pain. Patient stated she would call her neurologist.

## 2016-06-22 NOTE — Telephone Encounter (Signed)
New message    Pt is calling because she is scheduled for an echo Thursday. She states her bp is high because of her back pain, and wants to know if she should come to appt Thursday.    Pt c/o BP issue: STAT if pt c/o blurred vision, one-sided weakness or slurred speech  1. What are your last 5 BP readings? 155/101  2. Are you having any other symptoms (ex. Dizziness, headache, blurred vision, passed out)? Pt states her neck is hurting. No other symptoms  3. What is your BP issue? bp is high. Pt states it's due to her back pain.

## 2016-06-24 ENCOUNTER — Ambulatory Visit (HOSPITAL_COMMUNITY): Payer: Medicare PPO | Attending: Cardiology

## 2016-06-24 ENCOUNTER — Other Ambulatory Visit: Payer: Self-pay

## 2016-06-24 DIAGNOSIS — I493 Ventricular premature depolarization: Secondary | ICD-10-CM | POA: Diagnosis not present

## 2016-06-24 DIAGNOSIS — I371 Nonrheumatic pulmonary valve insufficiency: Secondary | ICD-10-CM | POA: Insufficient documentation

## 2016-06-24 DIAGNOSIS — E785 Hyperlipidemia, unspecified: Secondary | ICD-10-CM | POA: Diagnosis not present

## 2016-06-24 DIAGNOSIS — I10 Essential (primary) hypertension: Secondary | ICD-10-CM | POA: Insufficient documentation

## 2016-07-12 ENCOUNTER — Ambulatory Visit (INDEPENDENT_AMBULATORY_CARE_PROVIDER_SITE_OTHER): Payer: Medicare PPO | Admitting: Family Medicine

## 2016-07-12 ENCOUNTER — Encounter: Payer: Self-pay | Admitting: Family Medicine

## 2016-07-12 VITALS — BP 140/80 | HR 83 | Temp 97.5°F | Ht 65.0 in | Wt 156.3 lb

## 2016-07-12 DIAGNOSIS — N189 Chronic kidney disease, unspecified: Secondary | ICD-10-CM | POA: Diagnosis not present

## 2016-07-12 DIAGNOSIS — I10 Essential (primary) hypertension: Secondary | ICD-10-CM | POA: Diagnosis not present

## 2016-07-12 DIAGNOSIS — H6122 Impacted cerumen, left ear: Secondary | ICD-10-CM

## 2016-07-12 DIAGNOSIS — Z0001 Encounter for general adult medical examination with abnormal findings: Secondary | ICD-10-CM

## 2016-07-12 DIAGNOSIS — K5909 Other constipation: Secondary | ICD-10-CM

## 2016-07-12 DIAGNOSIS — K219 Gastro-esophageal reflux disease without esophagitis: Secondary | ICD-10-CM

## 2016-07-12 DIAGNOSIS — E782 Mixed hyperlipidemia: Secondary | ICD-10-CM | POA: Diagnosis not present

## 2016-07-12 DIAGNOSIS — M1991 Primary osteoarthritis, unspecified site: Secondary | ICD-10-CM

## 2016-07-12 DIAGNOSIS — Z23 Encounter for immunization: Secondary | ICD-10-CM | POA: Diagnosis not present

## 2016-07-12 DIAGNOSIS — Z0000 Encounter for general adult medical examination without abnormal findings: Secondary | ICD-10-CM

## 2016-07-12 LAB — LIPID PANEL
CHOL/HDL RATIO: 10
Cholesterol: 453 mg/dL — ABNORMAL HIGH (ref 0–200)
HDL: 47.4 mg/dL (ref >39.00)
NonHDL: 405.41
Triglycerides: 225 mg/dL — ABNORMAL HIGH (ref 0.0–149.0)
VLDL: 45 mg/dL — ABNORMAL HIGH (ref 0.0–40.0)

## 2016-07-12 LAB — BASIC METABOLIC PANEL
BUN: 21 mg/dL (ref 6–23)
CHLORIDE: 101 meq/L (ref 96–112)
CO2: 25 meq/L (ref 19–32)
CREATININE: 1.14 mg/dL (ref 0.40–1.20)
Calcium: 10.2 mg/dL (ref 8.4–10.5)
GFR: 49.48 mL/min — ABNORMAL LOW (ref >60.00)
Glucose, Bld: 110 mg/dL — ABNORMAL HIGH (ref 70–99)
Potassium: 4.2 mEq/L (ref 3.5–5.1)
Sodium: 134 mEq/L — ABNORMAL LOW (ref 135–145)

## 2016-07-12 LAB — HEPATIC FUNCTION PANEL
ALBUMIN: 4.8 g/dL (ref 3.5–5.2)
ALK PHOS: 66 U/L (ref 39–117)
ALT: 28 U/L (ref 0–35)
AST: 19 U/L (ref 0–37)
BILIRUBIN DIRECT: 0 mg/dL (ref 0.0–0.3)
Total Bilirubin: 0.4 mg/dL (ref 0.2–1.2)
Total Protein: 7.4 g/dL (ref 6.0–8.3)

## 2016-07-12 LAB — LDL CHOLESTEROL, DIRECT: Direct LDL: 334 mg/dL

## 2016-07-12 NOTE — Progress Notes (Signed)
Subjective:     Patient ID: Madison Hernandez, female   DOB: [birth year], 76 y.o.   MRN: [id number]  HPI Patient here for physical exam. Her chronic problems include history of migraine headaches, hypertension, PVCs, osteoarthritis, dyslipidemia. She had recent echocardiogram which showed normal ejection fraction and some diastolic dysfunction but otherwise no acute findings.  She's not had a mammogram in several years. She declines bone density testing. She's had previous Prevnar but no documented Pneumovax.  Hypertension treated with amlodipine and enalapril. Inconsistently taking amlodipine. She has long-standing history of GERD which has been controlled with Protonix.  Past Medical History:  Diagnosis Date  . Acute ischemic colitis ([place]) 1/12  . Arthritis   . Bleeding ulcer    22 years ago  . Blood in stool   . Cardiac arrhythmia due to congenital heart disease   . Depression   . Diverticulitis   . Dyslipidemia   . Headache(784.0)   . Hyperlipidemia   . Hypertension   . Migraine   . UTI (urinary tract infection)    Past Surgical History:  Procedure Laterality Date  . bleeding ulcers     22 years ago  . vein procedures     right and left lower extremity vein procedures    reports that she quit smoking about 33 years ago. Her smoking use included Cigarettes. She has a 20.00 pack-year smoking history. She has never used smokeless tobacco. She reports that she does not drink alcohol or use drugs. family history includes Diabetes in her mother; Heart disease in her mother; Hypertension in her mother and sister. Allergies  Allergen Reactions  . Avelox [Moxifloxacin Hcl In Nacl] Shortness Of Breath  . Mucinex [Guaifenesin Er]     "Makes my heart flutter"  . Sulfa Antibiotics     hives     Review of Systems  Constitutional: Positive for appetite change. Negative for activity change, fatigue, fever and unexpected weight change.  HENT: Negative for ear pain, hearing loss, sore  throat and trouble swallowing.   Eyes: Negative for visual disturbance.  Respiratory: Negative for cough and shortness of breath.   Cardiovascular: Negative for chest pain and palpitations.  Gastrointestinal: Negative for abdominal pain, blood in stool, constipation and diarrhea.  Endocrine: Negative for polydipsia and polyuria.  Genitourinary: Negative for dysuria and hematuria.  Musculoskeletal: Positive for arthralgias. Negative for back pain and myalgias.  Skin: Negative for rash.  Neurological: Negative for dizziness, syncope and headaches.  Hematological: Negative for adenopathy.  Psychiatric/Behavioral: Negative for confusion and dysphoric mood.       Objective:   Physical Exam  Constitutional: She is oriented to person, place, and time. She appears well-developed and well-nourished.  HENT:  Head: Normocephalic and atraumatic.  Cerumen impaction left canal.  Right TM is normal.  Eyes: EOM are normal. Pupils are equal, round, and reactive to light.  Neck: Normal range of motion. Neck supple. No thyromegaly present.  Cardiovascular: Normal rate, regular rhythm and normal heart sounds.   No murmur heard. Pulmonary/Chest: Breath sounds normal. No respiratory distress. She has no wheezes. She has no rales.  Breasts symmetric with no mass.  Abdominal: Soft. Bowel sounds are normal. She exhibits no distension and no mass. There is no tenderness. There is no rebound and no guarding.  Musculoskeletal: Normal range of motion. She exhibits no edema.  Lymphadenopathy:    She has no cervical adenopathy.  Neurological: She is alert and oriented to person, place, and time. She displays normal reflexes.  No cranial nerve deficit.  Skin: No rash noted.  Psychiatric: She has a normal mood and affect. Her behavior is normal. Judgment and thought content normal.       Assessment:     Physical exam. Patient is overdue for mammogram and has not had documented Pneumovax    Plan:      -patient is encouraged to set up mammogram -Pneumovax given -Check screening lab work -irrigation of cerumen left ear canal-removed with no difficulty. -Discussed bone density testing and patient declines -Patient mentions when leaving that she had MRI per neurosurgeon a few weeks ago that showed possibly "thyroid nodules ". We cannot locate this report and have asked that she contact neurosurgeon have forwarded to [place]. (none palpated on neck exam today).  Eulas Post MD [facility]

## 2016-07-12 NOTE — Patient Instructions (Signed)
Set up mammogram You received pneumovax today. Consider setting up Medicare Annual Wellness Visit Let us know if you change your mind about setting up bone density exam.

## 2016-07-13 ENCOUNTER — Other Ambulatory Visit: Payer: Self-pay | Admitting: Family Medicine

## 2016-07-13 DIAGNOSIS — E785 Hyperlipidemia, unspecified: Secondary | ICD-10-CM

## 2016-07-13 MED ORDER — ROSUVASTATIN CALCIUM 20 MG PO TABS: 20.0000 mg | ORAL_TABLET | Freq: Every day | ORAL | 1 refills | Status: DC

## 2016-07-29 NOTE — Progress Notes (Deleted)
Subjective:   Madison Hernandez is a 76 y.o. female who presents for an Initial Medicare Annual Wellness Visit.  Review of Systems    No ROS.  Medicare Wellness Visit. Additional risk factors are reflected in the social history.     Sleep patterns:  Home Safety/Smoke Alarms: Feels safe in home. Smoke alarms in place.  Living environment; residence and Firearm Safety:  [place] Safety/Bike Helmet: Wears seat belt.   Counseling:   Dental-  Female:   Pap-       Mammo-     01/06/2011 Negative. Dexa scan-     No record.   CCS-  06/21/2010  No record.    Objective:    There were no vitals filed for this visit. There is no height or weight on file to calculate BMI.   Current Medications (verified) Outpatient Encounter Prescriptions as of 08/05/2016  Medication Sig  . amLODipine (NORVASC) 5 MG tablet Take 1 tablet (5 mg total) by mouth daily.  . benazepril (LOTENSIN) 20 MG tablet Take 1 tablet (20 mg total) by mouth daily.  . Linaclotide (LINZESS PO) Take by mouth. Given by GI  . pantoprazole (PROTONIX) 20 MG tablet Take 20 mg by mouth daily. Given by GI  . rosuvastatin (CRESTOR) 20 MG tablet Take 1 tablet (20 mg total) by mouth daily.  . traMADol (ULTRAM) 50 MG tablet Take by mouth every 6 (six) hours as needed. Given by pain management   No facility-administered encounter medications on file as of 08/05/2016.     Allergies (verified) Avelox [moxifloxacin hcl in nacl]; Mucinex [guaifenesin er]; and Sulfa antibiotics   History: Past Medical History:  Diagnosis Date  . Acute ischemic colitis ([place]) 1/12  . Arthritis   . Bleeding ulcer    22 years ago  . Blood in stool   . Cardiac arrhythmia due to congenital heart disease   . Depression   . Diverticulitis   . Dyslipidemia   . Headache(784.0)   . Hyperlipidemia   . Hypertension   . Migraine   . UTI (urinary tract infection)    Past Surgical History:  Procedure Laterality Date  . bleeding ulcers     22 years ago    . vein procedures     right and left lower extremity vein procedures   Family History  Problem Relation Age of Onset  . Heart disease Mother        age 57  . Diabetes Mother   . Hypertension Mother   . Hypertension Sister    Social History   Occupational History  . Not on file.   Social History Main Topics  . Smoking status: Former Smoker    Packs/day: 2.00    Years: 10.00    Types: Cigarettes    Quit date: 01/19/1983  . Smokeless tobacco: Never Used  . Alcohol use No  . Drug use: No  . Sexual activity: Not on file    Tobacco Counseling Counseling given: Not Answered   Activities of Daily Living No flowsheet data found.  Immunizations and Health Maintenance Immunization History  Administered Date(s) Administered  . Influenza Split 12/15/2011, 09/29/2012  . Influenza, High Dose Seasonal PF 09/22/2013  . Pneumococcal Conjugate-13 01/18/2010, 11/06/2013  . Pneumococcal Polysaccharide-23 07/12/2016  . Td 01/20/1998   Health Maintenance Due  Topic Date Due  . TETANUS/TDAP  01/21/2008  . MAMMOGRAM  01/05/2013    Patient Care Team: Madison Post, MD as PCP - General (Family Medicine)  Indicate any recent Medical Services you may have received from other than [facility] providers in the past year (date may be approximate).     Assessment:   This is a routine wellness examination for Madison Hernandez. Physical assessment deferred to PCP.   Hearing/Vision screen No exam data present  Dietary issues and exercise activities discussed:   Diet (meal preparation, eat out, water intake, caffeinated beverages, dairy products, fruits and vegetables):  Breakfast: Lunch:  Dinner:      Goals    None     Depression Screen PHQ 2/9 Scores 08/04/2015 07/01/2014 04/05/2013 03/09/2013  PHQ - 2 Score 0 0 0 0    Fall Risk Fall Risk  08/04/2015 07/01/2014 04/05/2013 03/09/2013  Falls in the past year? No No No No    Cognitive Function:        Screening Tests Health Maintenance   Topic Date Due  . TETANUS/TDAP  01/21/2008  . MAMMOGRAM  01/05/2013  . DEXA SCAN  07/12/2017 (Originally 03/10/2007)  . INFLUENZA VACCINE  08/18/2016  . COLONOSCOPY  06/20/2020  . PNA vac Low Risk Adult  Completed      Plan:   ***  I have personally reviewed and noted the following in the patient's chart:   . Medical and social history . Use of alcohol, tobacco or illicit drugs  . Current medications and supplements . Functional ability and status . Nutritional status . Physical activity . Advanced directives . List of other physicians . Vitals . Screenings to include cognitive, depression, and falls . Referrals and appointments  In addition, I have reviewed and discussed with patient certain preventive protocols, quality metrics, and best practice recommendations. A written personalized care plan for preventive services as well as general preventive health recommendations were provided to patient.     Ree Edman, RN   07/29/2016

## 2016-07-29 NOTE — Progress Notes (Deleted)
Pre visit review using our clinic review tool, if applicable. No additional management support is needed unless otherwise documented below in the visit note. 

## 2016-08-05 ENCOUNTER — Ambulatory Visit: Payer: Medicare PPO

## 2016-08-05 ENCOUNTER — Encounter (HOSPITAL_COMMUNITY): Payer: Self-pay

## 2016-08-05 ENCOUNTER — Emergency Department (HOSPITAL_COMMUNITY): Payer: Medicare PPO

## 2016-08-05 ENCOUNTER — Telehealth: Payer: Self-pay

## 2016-08-05 ENCOUNTER — Emergency Department (HOSPITAL_COMMUNITY)
Admission: EM | Admit: 2016-08-05 | Discharge: 2016-08-05 | Disposition: A | Discharge status: Home / Self Care | Payer: Medicare PPO | Attending: Emergency Medicine | Admitting: Emergency Medicine

## 2016-08-05 ENCOUNTER — Telehealth: Payer: Self-pay | Admitting: Family Medicine

## 2016-08-05 DIAGNOSIS — R519 Headache, unspecified: Secondary | ICD-10-CM

## 2016-08-05 DIAGNOSIS — R42 Dizziness and giddiness: Secondary | ICD-10-CM | POA: Diagnosis present

## 2016-08-05 DIAGNOSIS — Z79899 Other long term (current) drug therapy: Secondary | ICD-10-CM | POA: Insufficient documentation

## 2016-08-05 DIAGNOSIS — I10 Essential (primary) hypertension: Secondary | ICD-10-CM | POA: Insufficient documentation

## 2016-08-05 DIAGNOSIS — R51 Headache: Secondary | ICD-10-CM | POA: Diagnosis not present

## 2016-08-05 DIAGNOSIS — R7989 Other specified abnormal findings of blood chemistry: Secondary | ICD-10-CM

## 2016-08-05 DIAGNOSIS — Z87891 Personal history of nicotine dependence: Secondary | ICD-10-CM | POA: Insufficient documentation

## 2016-08-05 LAB — HEPATIC FUNCTION PANEL
ALT: 19 U/L (ref 14–54)
AST: 22 U/L (ref 15–41)
Albumin: 4.3 g/dL (ref 3.5–5.0)
Alkaline Phosphatase: 63 U/L (ref 38–126)
BILIRUBIN TOTAL: 0.7 mg/dL (ref 0.3–1.2)
Total Protein: 7 g/dL (ref 6.5–8.1)

## 2016-08-05 LAB — CBC WITH DIFFERENTIAL/PLATELET
Basophils Absolute: 0 10*3/uL (ref 0.0–0.1)
Basophils Relative: 0 %
EOS PCT: 4 %
Eosinophils Absolute: 0.3 10*3/uL (ref 0.0–0.7)
HEMATOCRIT: 33.9 % — AB (ref 36.0–46.0)
Hemoglobin: 10.9 g/dL — ABNORMAL LOW (ref 12.0–15.0)
LYMPHS ABS: 1.6 10*3/uL (ref 0.7–4.0)
LYMPHS PCT: 23 %
MCH: 28.8 pg (ref 26.0–34.0)
MCHC: 32.2 g/dL (ref 30.0–36.0)
MCV: 89.7 fL (ref 78.0–100.0)
MONO ABS: 0.4 10*3/uL (ref 0.1–1.0)
Monocytes Relative: 5 %
NEUTROS ABS: 4.7 10*3/uL (ref 1.7–7.7)
Neutrophils Relative %: 68 %
Platelets: 180 10*3/uL (ref 150–400)
RBC: 3.78 MIL/uL — AB (ref 3.87–5.11)
RDW: 13 % (ref 11.5–15.5)
WBC: 6.9 10*3/uL (ref 4.0–10.5)

## 2016-08-05 LAB — BASIC METABOLIC PANEL
ANION GAP: 10 (ref 5–15)
BUN: 18 mg/dL (ref 6–20)
CO2: 24 mmol/L (ref 22–32)
Calcium: 9.6 mg/dL (ref 8.9–10.3)
Chloride: 101 mmol/L (ref 101–111)
Creatinine, Ser: 1.27 mg/dL — ABNORMAL HIGH (ref 0.44–1.00)
GFR calc Af Amer: 47 mL/min — ABNORMAL LOW (ref >60)
GFR calc non Af Amer: 41 mL/min — ABNORMAL LOW (ref >60)
GLUCOSE: 103 mg/dL — AB (ref 65–99)
POTASSIUM: 3.9 mmol/L (ref 3.5–5.1)
Sodium: 135 mmol/L (ref 135–145)

## 2016-08-05 LAB — TSH: TSH: 5.954 u[IU]/mL — ABNORMAL HIGH (ref 0.350–4.500)

## 2016-08-05 MED ORDER — KETOROLAC TROMETHAMINE 30 MG/ML IJ SOLN
15.0000 mg | Freq: Once | INTRAMUSCULAR | Status: AC
  Administered 2016-08-05: 15 mg via INTRAVENOUS
  Filled 2016-08-05: qty 1

## 2016-08-05 MED ORDER — PROCHLORPERAZINE EDISYLATE 5 MG/ML IJ SOLN
10.0000 mg | Freq: Once | INTRAMUSCULAR | Status: AC
  Administered 2016-08-05: 10 mg via INTRAVENOUS
  Filled 2016-08-05: qty 2

## 2016-08-05 MED ORDER — DIPHENHYDRAMINE HCL 50 MG/ML IJ SOLN
12.5000 mg | Freq: Once | INTRAMUSCULAR | Status: AC
  Administered 2016-08-05: 12.5 mg via INTRAVENOUS
  Filled 2016-08-05: qty 1

## 2016-08-05 MED ORDER — METOCLOPRAMIDE HCL 5 MG/ML IJ SOLN
10.0000 mg | Freq: Once | INTRAMUSCULAR | Status: AC
  Administered 2016-08-05: 10 mg via INTRAVENOUS
  Filled 2016-08-05: qty 2

## 2016-08-05 NOTE — Discharge Instructions (Signed)
As we discussed, your thyroid test today was elevated. You will need to have this followed up with your primary care doctor. I do recommend that you have the thyroid ultrasound that was scheduled. Your head CT was negative. He may need to talk to your doctor about your medications if you continue experiencing headaches. Return here for any new or worsening symptoms-- dizziness, weakness, confusion, numbness, trouble walking, changes in speech, etc.

## 2016-08-05 NOTE — ED Provider Notes (Signed)
[facility] DEPT Provider Note   CSN: [id number] Arrival date & time: 08/05/16  1537     History   Chief Complaint Chief Complaint  Patient presents with  . Dizziness  . Headache    HPI Madison Hernandez is a 76 y.o. female.  The history is provided by the patient and medical records.     76 y.o. F with hx of ischemic colitis, bleeding ulcers, depression, dyslipidemia, HTN, migraine headaches, presenting to the ED for headache.  Patient states she has had persistent headache since Tuesday (2 days ago).  States headache started on the right side of her head but has since migrated out throughout her whole head but seems worse across the forehead and between the eyes.  States pain feels like pressure.  States she has had some incidences of mild dizziness and blurred vision but these are very short lived and is not experiencing either of these currently.  Denies photophobia, nausea, vomiting, numbness, weakness, fever, chills, chest pain, SOB.  States she has had headaches in the past but this feels different than a normal headache.  States she was re-started on crestor about 2 weeks ago.  States the last time she took this medication she had bad headaches so is wondering if this could be contributing.  She denies falls or head trauma.  Not currently on blood thinners.  She does have chronic neck pain-- actually had MRI a few weeks ago and they found some thyroid nodules as well.  Past Medical History:  Diagnosis Date  . Acute ischemic colitis ([place] ) 1/12  . Arthritis   . Bleeding ulcer    22 years ago  . Blood in stool   . Cardiac arrhythmia due to congenital heart disease   . Depression   . Diverticulitis   . Dyslipidemia   . Headache(784.0)   . Hyperlipidemia   . Hypertension   . Migraine   . UTI (urinary tract infection)     Patient Active Problem List   Diagnosis Date Noted  . PVC (premature ventricular contraction) 06/17/2016  . Osteoarthritis, multiple sites 11/06/2013   . Chronic kidney disease 03/14/2012  . Hyperglycemia 02/14/2012  . Edema 07/21/2011  . Fatigue 01/21/2011  . History of depression 08/21/2010  . Acute ischemic colitis ([place]) 08/21/2010  . Migraine headache 08/21/2010  . Abnormal ECG 07/08/2010  . HTN (hypertension) 07/08/2010  . Mixed hyperlipidemia 07/08/2010  . Abnormal ECG 07/08/2010  . Palpitations 07/08/2010    Past Surgical History:  Procedure Laterality Date  . bleeding ulcers     22 years ago  . vein procedures     right and left lower extremity vein procedures    OB History    No data available       Home Medications    Prior to Admission medications   Medication Sig Start Date End Date Taking? Authorizing Provider  amLODipine (NORVASC) 5 MG tablet Take 1 tablet (5 mg total) by mouth daily. 03/17/16   Burchette, Alinda Sierras, MD  benazepril (LOTENSIN) 20 MG tablet Take 1 tablet (20 mg total) by mouth daily. 03/17/16   Burchette, Alinda Sierras, MD  Linaclotide (LINZESS PO) Take by mouth. Given by GI    [provider]  pantoprazole (PROTONIX) 20 MG tablet Take 20 mg by mouth daily. Given by GI    [provider]  rosuvastatin (CRESTOR) 20 MG tablet Take 1 tablet (20 mg total) by mouth daily. 07/13/16   Burchette, Alinda Sierras, MD  traMADol Veatrice Bourbon)  50 MG tablet Take by mouth every 6 (six) hours as needed. Given by pain management    [provider]    Family History Family History  Problem Relation Age of Onset  . Heart disease Mother        age 53  . Diabetes Mother   . Hypertension Mother   . Hypertension Sister     Social History Social History  Substance Use Topics  . Smoking status: Former Smoker    Packs/day: 2.00    Years: 10.00    Types: Cigarettes    Quit date: 01/19/1983  . Smokeless tobacco: Never Used  . Alcohol use No     Allergies   Avelox [moxifloxacin hcl in nacl]; Mucinex [guaifenesin er]; and Sulfa antibiotics   Review of Systems Review of Systems  Neurological:  Positive for headaches.  All other systems reviewed and are negative.    Physical Exam Updated Vital Signs Temp 97.6 F (36.4 C) (Oral)   Ht 5\' 7"  (1.702 m)   Wt 70.8 kg (156 lb)   BMI 24.43 kg/m   Physical Exam  Constitutional: She is oriented to person, place, and time. She appears well-developed and well-nourished. No distress.  HENT:  Head: Normocephalic and atraumatic.  Right Ear: External ear normal.  Left Ear: External ear normal.  Eyes: Pupils are equal, round, and reactive to light. Conjunctivae and EOM are normal.  Neck: Normal range of motion and full passive range of motion without pain. Neck supple. No neck rigidity.  No rigidity, no meningismus  Cardiovascular: Normal rate, regular rhythm and normal heart sounds.   No murmur heard. Pulmonary/Chest: Effort normal and breath sounds normal. No respiratory distress. She has no wheezes. She has no rhonchi.  Abdominal: Soft. Bowel sounds are normal. There is no tenderness. There is no guarding.  Musculoskeletal: Normal range of motion. She exhibits no edema.  Neurological: She is alert and oriented to person, place, and time. She has normal strength. She displays no tremor. No cranial nerve deficit or sensory deficit. She displays no seizure activity.  AAOx3, answering questions and following commands appropriately; equal strength UE and LE bilaterally; CN grossly intact; moves all extremities appropriately without ataxia; no focal neuro deficits or facial asymmetry appreciated  Skin: Skin is warm and dry. No rash noted. She is not diaphoretic.  Psychiatric: She has a normal mood and affect. Her behavior is normal. Thought content normal.  Nursing note and vitals reviewed.    ED Treatments / Results  Labs (all labs ordered are listed, but only abnormal results are displayed) Labs Reviewed  CBC WITH DIFFERENTIAL/PLATELET - Abnormal; Notable for the following:       Result Value   RBC 3.78 (*)    Hemoglobin 10.9 (*)      HCT 33.9 (*)    All other components within normal limits  BASIC METABOLIC PANEL - Abnormal; Notable for the following:    Glucose, Bld 103 (*)    Creatinine, Ser 1.27 (*)    GFR calc non Af Amer 41 (*)    GFR calc Af Amer 47 (*)    All other components within normal limits  HEPATIC FUNCTION PANEL - Abnormal; Notable for the following:    Bilirubin, Direct <0.1 (*)    All other components within normal limits  TSH - Abnormal; Notable for the following:    TSH 5.954 (*)    All other components within normal limits    EKG  EKG Interpretation None  Radiology Ct Head Wo Contrast  Result Date: 08/05/2016 CLINICAL DATA:  Four-day history of generalized headache. EXAM: CT HEAD WITHOUT CONTRAST TECHNIQUE: Contiguous axial images were obtained from the base of the skull through the vertex without intravenous contrast. COMPARISON:  05/26/2010. FINDINGS: Brain: Ventricular system normal in size and appearance for age. Minimal changes of small vessel disease of the white matter. No mass lesion. No midline shift. No acute hemorrhage or hematoma. No extra-axial fluid collections. No evidence of acute infarction. No interval change. For Vascular: Mild bilateral carotid siphon and right vertebral artery atherosclerosis. Skull: No skull fracture or other focal osseous abnormality involving the skull. Sinuses/Orbits: Visualized paranasal sinuses, bilateral mastoid air cells and bilateral middle ear cavities well-aerated. Visualized orbits normal. Other: None. IMPRESSION: 1. No acute intracranial abnormality. 2. Stable minimal chronic microvascular ischemic changes of the white matter. Electronically Signed   By: Evangeline Dakin M.D.   On: 08/05/2016 16:39    Procedures Procedures (including critical care time)  Medications Ordered in ED Medications  metoCLOPramide (REGLAN) injection 10 mg (10 mg Intravenous Given 08/05/16 1607)  diphenhydrAMINE (BENADRYL) injection 12.5 mg (12.5 mg  Intravenous Given 08/05/16 1607)  ketorolac (TORADOL) 30 MG/ML injection 15 mg (15 mg Intravenous Given 08/05/16 1722)  prochlorperazine (COMPAZINE) injection 10 mg (10 mg Intravenous Given 08/05/16 1722)     Initial Impression / Assessment and Plan / ED Course  I have reviewed the triage vital signs and the nursing notes.  Pertinent labs & imaging results that were available during my care of the patient were reviewed by me and considered in my medical decision making (see chart for details).  76 year old female here with headache for the past 2 days. Reports history of migraines, but feels different. Does report history of headaches when taking Crestor in the past, was restarted on this 2 weeks ago.  Patient is awake, alert, appropriately oriented. No focal neurologic deficits. No clinical signs or symptoms concerning for meningitis. Given her age, head CT was obtained which is negative for acute findings. Screening lab work is overall reassuring. Patient did mention she had a thyroid nodule seen on MRI she had a few weeks ago, therefore TSH was sent and is elevated. She will follow-up for this with her primary care doctor, has already been scheduled for ultrasound. Headache is resolved here after medications. She remains neurologically intact. Vitals stable. Seems appropriate for discharge. Will have her follow-up closely with her primary care doctor.  Discussed plan with patient, she acknowledged understanding and agreed with plan of care.  Return precautions given for new or worsening symptoms.  Final Clinical Impressions(s) / ED Diagnoses   Final diagnoses:  Bad headache  Elevated TSH    New Prescriptions Discharge Medication List as of 08/05/2016  7:00 PM       Larene Pickett, PA-C 08/05/16 2059    Virgel Manifold, MD 08/14/16 1306

## 2016-08-05 NOTE — ED Triage Notes (Signed)
Pt presents to the ed with complaints of a headache x 4 days that is all over and achy, today started with some dizziness that is intermittent and not present at this time. Pt is alert and oriented with a steady gait. Endorses mild neck pain as well

## 2016-08-05 NOTE — Telephone Encounter (Signed)
Patient dropped off a disk from a test done from [facility].  Dr told patient that he sees thyroid nodules on her neck and may want Dr Elease Hashimoto to review the disk.  Disk placed in Dr's folder in front office.

## 2016-08-05 NOTE — Telephone Encounter (Signed)
Patient came in to office with c/o "intense headache" and elevated BP for 4 days. She states that she has taken Tylenol, Bayer and Naproxen without relief. She also took double doses of Amlodipine and Benazepril today with no improvement in BP.  Vitals in office are: BP 162/78 HR 88 O2 96 5/10 pain scale She also c/o some blurry vision and dizziness. She denies any speech problems, facial drooping or strength changes. She did drive herself to the office.  Per Dr. Maudie Mercury she recommends pt be evaluated in the ED. Pt agrees. EMS called and transported pt to hospital for evaluation.   Dr. Elease Hashimoto - [facility]

## 2016-08-06 NOTE — Telephone Encounter (Signed)
Left message on machine for patient to return our call 

## 2016-08-06 NOTE — Telephone Encounter (Signed)
Patient is aware 

## 2016-08-06 NOTE — Telephone Encounter (Signed)
Recommend follow up before further meds.

## 2016-08-06 NOTE — Telephone Encounter (Signed)
Follow up appointment made.  Patient went to the ER yesterday.  Patient now "extremly hyper and agitated".  She has not been able to sleep.  She was given benadryl yesterday and believes it may be reaction from the benadryl.  Patient would like to know if there is a medication "to help her calm down just for a short time"?

## 2016-08-06 NOTE — Telephone Encounter (Signed)
Make sure pt has follow up to discuss.

## 2016-08-10 ENCOUNTER — Encounter: Payer: Self-pay | Admitting: Family Medicine

## 2016-08-10 ENCOUNTER — Ambulatory Visit (INDEPENDENT_AMBULATORY_CARE_PROVIDER_SITE_OTHER): Payer: Medicare PPO | Admitting: Family Medicine

## 2016-08-10 VITALS — BP 140/70 | HR 92 | Temp 97.6°F | Wt 155.6 lb

## 2016-08-10 DIAGNOSIS — R946 Abnormal results of thyroid function studies: Secondary | ICD-10-CM

## 2016-08-10 DIAGNOSIS — F411 Generalized anxiety disorder: Secondary | ICD-10-CM | POA: Diagnosis not present

## 2016-08-10 DIAGNOSIS — R7989 Other specified abnormal findings of blood chemistry: Secondary | ICD-10-CM

## 2016-08-10 DIAGNOSIS — E041 Nontoxic single thyroid nodule: Secondary | ICD-10-CM | POA: Diagnosis not present

## 2016-08-10 MED ORDER — SERTRALINE HCL 50 MG PO TABS: 50.0000 mg | ORAL_TABLET | Freq: Every day | ORAL | 6 refills | Status: DC

## 2016-08-10 NOTE — Progress Notes (Signed)
Subjective:     Patient ID: Madison Hernandez, female   DOB: 1940/02/03, 76 y.o.   MRN: [id number]  HPI Patient is here for several issues as follows  Recent atypical headache. She was sent to ER and had CT of head which showed no acute abnormalities. Patient states she has had upper back pain and headache previously which she attributed to Crestor. She discontinued the Crestor has had no further headaches. She has some chronic cervical and thoracic back pain. She had MRI scans from neurosurgical group back in late May. We had these today for review and she had small left thyroid nodules noted incidentally on her cervical x-rays. There is recommendation to consider thyroid ultrasound which she has not yet had.  Recent ER notes reviewed. She had mildly elevated TSH. No history of hyper or hypothyroidism.  Patient here to discuss anxiety issues. She's had what she describes a "panic attack ". When driving home from recent ER visit above she felt extremely anxious which came on suddenly and lasted about an hour. She had some shortness of breath and had feeling of impending doom and extremely anxious. She's had a couple milder episodes since then. Minimal caffeine use.  Past Medical History:  Diagnosis Date  . Acute ischemic colitis ([place]) 1/12  . Arthritis   . Bleeding ulcer    22 years ago  . Blood in stool   . Cardiac arrhythmia due to congenital heart disease   . Depression   . Diverticulitis   . Dyslipidemia   . Headache(784.0)   . Hyperlipidemia   . Hypertension   . Migraine   . UTI (urinary tract infection)    Past Surgical History:  Procedure Laterality Date  . bleeding ulcers     22 years ago  . vein procedures     right and left lower extremity vein procedures    reports that she quit smoking about 33 years ago. Her smoking use included Cigarettes. She has a 20.00 pack-year smoking history. She has never used smokeless tobacco. She reports that she does not drink alcohol or use  drugs. family history includes Diabetes in her mother; Heart disease in her mother; Hypertension in her mother and sister. Allergies  Allergen Reactions  . Avelox [Moxifloxacin Hcl In Nacl] Shortness Of Breath  . Mucinex [Guaifenesin Er] Other (See Comments)    "Makes my heart flutter"  . Sulfa Antibiotics Hives     Review of Systems  Constitutional: Negative for appetite change, fatigue and unexpected weight change.  Eyes: Negative for visual disturbance.  Respiratory: Negative for cough, chest tightness, shortness of breath and wheezing.   Cardiovascular: Negative for chest pain, palpitations and leg swelling.  Gastrointestinal: Negative for abdominal pain.  Endocrine: Negative for cold intolerance and heat intolerance.  Neurological: Negative for dizziness, seizures, syncope, weakness, light-headedness and headaches.       Objective:   Physical Exam  Constitutional: She appears well-developed and well-nourished.  Eyes: Pupils are equal, round, and reactive to light.  Neck: Neck supple. No JVD present. No thyromegaly present.  No neck masses palpated  Cardiovascular: Normal rate and regular rhythm.  Exam reveals no gallop.   Pulmonary/Chest: Effort normal and breath sounds normal. No respiratory distress. She has no wheezes. She has no rales.  Musculoskeletal: She exhibits no edema.  Neurological: She is alert.       Assessment:     #1 recent atypical headache with unremarkable CT head. Improved after discontinuation of Crestor  #2 recent  mildly elevated TSH  #3 chronic cervical and thoracic back pain with recent MRI scans as above showing degenerative arthritis  #4 mention of thyroid nodules left thyroid on recent cervical MRI  #5 acute anxiety. Probable panic disorder    Plan:     -Start sertraline 50 mg once daily and reassess in 3 weeks -Set up neck ultrasound to further assess left thyroid nodules -Discontinue Crestor this time -Three-week follow-up to  reassess -repeat TSH at follow up.  Eulas Post MD [facility] at [facility]

## 2016-08-10 NOTE — Patient Instructions (Signed)
We will set up neck ultrasound to evaluate thyroid nodules noted on MRI Start the Zoloft once daily Follow up in 3 weeks to reassess Leave off the Crestor.

## 2016-08-19 ENCOUNTER — Ambulatory Visit
Admission: RE | Admit: 2016-08-19 | Discharge: 2016-08-19 | Disposition: A | Discharge status: Home / Self Care | Payer: Medicare PPO | Source: Ambulatory Visit | Attending: Family Medicine | Admitting: Family Medicine

## 2016-08-19 DIAGNOSIS — E041 Nontoxic single thyroid nodule: Secondary | ICD-10-CM

## 2016-08-20 ENCOUNTER — Other Ambulatory Visit: Payer: Self-pay | Admitting: Family Medicine

## 2016-08-20 DIAGNOSIS — E041 Nontoxic single thyroid nodule: Secondary | ICD-10-CM

## 2016-08-23 ENCOUNTER — Other Ambulatory Visit (INDEPENDENT_AMBULATORY_CARE_PROVIDER_SITE_OTHER): Payer: Medicare PPO

## 2016-08-23 DIAGNOSIS — E785 Hyperlipidemia, unspecified: Secondary | ICD-10-CM

## 2016-08-23 LAB — LIPID PANEL
CHOL/HDL RATIO: 6
Cholesterol: 342 mg/dL — ABNORMAL HIGH (ref 0–200)
HDL: 54 mg/dL (ref >39.00)
LDL Cholesterol: 259 mg/dL — ABNORMAL HIGH (ref 0–99)
NONHDL: 287.9
TRIGLYCERIDES: 143 mg/dL (ref 0.0–149.0)
VLDL: 28.6 mg/dL (ref 0.0–40.0)

## 2016-08-23 LAB — HEPATIC FUNCTION PANEL
ALBUMIN: 4.5 g/dL (ref 3.5–5.2)
ALT: 13 U/L (ref 0–35)
AST: 15 U/L (ref 0–37)
Alkaline Phosphatase: 62 U/L (ref 39–117)
Bilirubin, Direct: 0.1 mg/dL (ref 0.0–0.3)
TOTAL PROTEIN: 7.4 g/dL (ref 6.0–8.3)
Total Bilirubin: 0.4 mg/dL (ref 0.2–1.2)

## 2016-08-31 ENCOUNTER — Ambulatory Visit (INDEPENDENT_AMBULATORY_CARE_PROVIDER_SITE_OTHER): Payer: Medicare PPO | Admitting: Family Medicine

## 2016-08-31 ENCOUNTER — Encounter: Payer: Self-pay | Admitting: Family Medicine

## 2016-08-31 VITALS — BP 130/60 | HR 80 | Temp 98.0°F | Wt 152.1 lb

## 2016-08-31 DIAGNOSIS — F4010 Social phobia, unspecified: Secondary | ICD-10-CM

## 2016-08-31 DIAGNOSIS — R946 Abnormal results of thyroid function studies: Secondary | ICD-10-CM

## 2016-08-31 DIAGNOSIS — E785 Hyperlipidemia, unspecified: Secondary | ICD-10-CM

## 2016-08-31 DIAGNOSIS — R7989 Other specified abnormal findings of blood chemistry: Secondary | ICD-10-CM

## 2016-08-31 MED ORDER — FLUOXETINE HCL 20 MG PO TABS: 20.0000 mg | ORAL_TABLET | Freq: Every day | ORAL | 3 refills | Status: DC

## 2016-08-31 NOTE — Progress Notes (Signed)
Subjective:     Patient ID: Madison Hernandez, female   DOB: October 03, 1940, 76 y.o.   MRN: [id number]  HPI Patient seen with possible side effects of sertraline. She has frequent history of anxiety which she thinks is mostly in social situations such as standing in lines and public places. She was started on Zoloft but states she's had increased dry mouth which she is convinced is related. She also states that her ears frequently "pop "and she is convinced this is related (to the Zoloft). She wishes to make changes there.  Recent elevated TSH 5.9. She's had some dry skin issues and fatigue and malaise. No prior history of hypothyroidism. She had recent neck ultrasound which showed thyroid nodule and biopsy has been scheduled. She has hypertension which has been stable.  Hyperlipidemia.  She states she had some stiff joints and myalgias and stopped the Crestor.  She has had intolerance of other statins in the past.  Past Medical History:  Diagnosis Date  . Acute ischemic colitis ([place]) 1/12  . Arthritis   . Bleeding ulcer    22 years ago  . Blood in stool   . Cardiac arrhythmia due to congenital heart disease   . Depression   . Diverticulitis   . Dyslipidemia   . Headache(784.0)   . Hyperlipidemia   . Hypertension   . Migraine   . UTI (urinary tract infection)    Past Surgical History:  Procedure Laterality Date  . bleeding ulcers     22 years ago  . vein procedures     right and left lower extremity vein procedures    reports that she quit smoking about 33 years ago. Her smoking use included Cigarettes. She has a 20.00 pack-year smoking history. She has never used smokeless tobacco. She reports that she does not drink alcohol or use drugs. family history includes Diabetes in her mother; Heart disease in her mother; Hypertension in her mother and sister. Allergies  Allergen Reactions  . Avelox [Moxifloxacin Hcl In Nacl] Shortness Of Breath  . Mucinex [Guaifenesin Er] Other (See  Comments)    "Makes my heart flutter"  . Sulfa Antibiotics Hives     Review of Systems  Constitutional: Positive for fatigue.  Eyes: Negative for visual disturbance.  Respiratory: Negative for cough, chest tightness, shortness of breath and wheezing.   Cardiovascular: Negative for chest pain, palpitations and leg swelling.  Gastrointestinal: Negative for abdominal pain.  Neurological: Negative for dizziness, seizures, syncope, weakness, light-headedness and headaches.  Psychiatric/Behavioral: The patient is nervous/anxious.        Objective:   Physical Exam  Constitutional: She appears well-developed and well-nourished.  Eyes: Pupils are equal, round, and reactive to light.  Neck: Neck supple. No JVD present.  Cardiovascular: Normal rate and regular rhythm.  Exam reveals no gallop.   Pulmonary/Chest: Effort normal and breath sounds normal. No respiratory distress. She has no wheezes. She has no rales.  Musculoskeletal: She exhibits no edema.  Lymphadenopathy:    She has no cervical adenopathy.  Neurological: She is alert.       Assessment:     #1 anxiety. Question social anxiety.. Possible side effects with sertraline  #2 thyroid nodule in process of being biopsied  #3 recent elevated TSH  #4 hyperlipidemia.  Possible side effects from Crestor and other statins.    Plan:     -Recheck labs with TSH and free T4 -Discontinue sertraline. Start Prozac 20 mg once daily and reassess in one month -Thyroid  biopsy as above -Discussed other alternatives to Crestor. She has severe hyperlipidemia and has been intolerant of multiple other statins in the past.  She is not interested in exploring PCSK-9 inhibitor therapy.  Eulas Post MD [facility] at [facility]

## 2016-08-31 NOTE — Patient Instructions (Signed)
Stop the Sertraline Start the Fluoxetine tomorrow one daily Let's plan on follow up in one month.

## 2016-09-02 ENCOUNTER — Ambulatory Visit (INDEPENDENT_AMBULATORY_CARE_PROVIDER_SITE_OTHER): Payer: Medicare PPO

## 2016-09-02 VITALS — BP 142/60 | HR 74 | Ht 65.0 in | Wt 152.0 lb

## 2016-09-02 DIAGNOSIS — Z0000 Encounter for general adult medical examination without abnormal findings: Secondary | ICD-10-CM

## 2016-09-02 NOTE — Progress Notes (Signed)
Reviewed in PCP absence.  Colin Benton R., DO

## 2016-09-02 NOTE — Patient Instructions (Addendum)
Madison Hernandez , Thank you for taking time to come for your Medicare Wellness Visit. I appreciate your ongoing commitment to your health goals. Please review the following plan we discussed and let me know if I can assist you in the future.   If you have spontaneous jaw pain or shooting pain in your left arm, please seek medical attention.  A Tetanus is recommended every 10 years. Medicare covers a tetanus if you have a cut or wound; otherwise, there may be a charge. If you had not had a tetanus with pertusses, known as the Tdap, you can take this anytime.   Will call and schedule your mammogram   Please schedule an eye exam at least every other year  These are the goals we discussed: To maintain her heatlh Goals    None        This is a list of the screening recommended for you and due dates:  Health Maintenance  Topic Date Due  . Tetanus Vaccine  01/21/2008  . Mammogram  01/05/2013  . Flu Shot  08/18/2016  . DEXA scan (bone density measurement)  07/12/2017*  . Colon Cancer Screening  06/20/2020  . Pneumonia vaccines  Completed  *Topic was postponed. The date shown is not the original due date.   Educated regarding prediabetes and numbers;  A1c ranges from 5.8 to 6.5 or fasting Blood sugar > 115 -126; (126 is diabetic)   Risk: >45yo; family hx; overweight or obese; African American; Hispanic; Latino; American [place] American; [place]; history of diabetes when pregnant; or birth to a baby weighing over 9 lbs. Being less physically active than 30 minutes; 3 times a week;   Prevention; Losing a modest 7 to 8 lbs; If over 200 lbs; 10 to 14 lbs;  Choose healthier foods; colorful veggies; fish or lean meats; drinks water Reduce portion size Start exercising; 30 minutes of fast walking x 30 minutes per day/ 60 min for weight loss    Fall Prevention in the Home Falls can cause injuries and can affect people from all age groups. There are many simple things that you  can do to make your home safe and to help prevent falls. What can I do on the outside of my home?  Regularly repair the edges of walkways and driveways and fix any cracks.  Remove high doorway thresholds.  Trim any shrubbery on the main path into your home.  Use bright outdoor lighting.  Clear walkways of debris and clutter, including tools and rocks.  Regularly check that handrails are securely fastened and in good repair. Both sides of any steps should have handrails.  Install guardrails along the edges of any raised decks or porches.  Have leaves, snow, and ice cleared regularly.  Use sand or salt on walkways during winter months.  In the garage, clean up any spills right away, including grease or oil spills. What can I do in the bathroom?  Use night lights.  Install grab bars by the toilet and in the tub and shower. Do not use towel bars as grab bars.  Use non-skid mats or decals on the floor of the tub or shower.  If you need to sit down while you are in the shower, use a plastic, non-slip stool.  Keep the floor dry. Immediately clean up any water that spills on the floor.  Remove soap buildup in the tub or shower on a regular basis.  Attach bath mats securely with double-sided non-slip rug tape.  Remove throw rugs and other tripping hazards from the floor. What can I do in the bedroom?  Use night lights.  Make sure that a bedside light is easy to reach.  Do not use oversized bedding that drapes onto the floor.  Have a firm chair that has side arms to use for getting dressed.  Remove throw rugs and other tripping hazards from the floor. What can I do in the kitchen?  Clean up any spills right away.  Avoid walking on wet floors.  Place frequently used items in easy-to-reach places.  If you need to reach for something above you, use a sturdy step stool that has a grab bar.  Keep electrical cables out of the way.  Do not use floor polish or wax that  makes floors slippery. If you have to use wax, make sure that it is non-skid floor wax.  Remove throw rugs and other tripping hazards from the floor. What can I do in the stairways?  Do not leave any items on the stairs.  Make sure that there are handrails on both sides of the stairs. Fix handrails that are broken or loose. Make sure that handrails are as long as the stairways.  Check any carpeting to make sure that it is firmly attached to the stairs. Fix any carpet that is loose or worn.  Avoid having throw rugs at the top or bottom of stairways, or secure the rugs with carpet tape to prevent them from moving.  Make sure that you have a light switch at the top of the stairs and the bottom of the stairs. If you do not have them, have them installed. What are some other fall prevention tips?  Wear closed-toe shoes that fit well and support your feet. Wear shoes that have rubber soles or low heels.  When you use a stepladder, make sure that it is completely opened and that the sides are firmly locked. Have someone hold the ladder while you are using it. Do not climb a closed stepladder.  Add color or contrast paint or tape to grab bars and handrails in your home. Place contrasting color strips on the first and last steps.  Use mobility aids as needed, such as canes, walkers, scooters, and crutches.  Turn on lights if it is dark. Replace any light bulbs that burn out.  Set up furniture so that there are clear paths. Keep the furniture in the same spot.  Fix any uneven floor surfaces.  Choose a carpet design that does not hide the edge of steps of a stairway.  Be aware of any and all pets.  Review your medicines with your healthcare provider. Some medicines can cause dizziness or changes in blood pressure, which increase your risk of falling. Talk with your health care provider about other ways that you can decrease your risk of falls. This may include working with a physical  therapist or trainer to improve your strength, balance, and endurance. This information is not intended to replace advice given to you by your health care provider. Make sure you discuss any questions you have with your health care provider. Document Released: 12/25/2001 Document Revised: 06/03/2015 Document Reviewed: 02/08/2014 Elsevier Interactive Patient Education  2017 [place].    Acute Coronary syndrome Acute coronary syndrome (ACS) is a serious problem in which there is suddenly not enough blood and oxygen supplied to the heart. ACS may mean that one or more of the blood vessels in your heart (coronary arteries) may be  blocked. ACS can result in chest pain or a heart attack (myocardial infarction or MI). What are the causes? This condition is caused by atherosclerosis, which is the buildup of fat and cholesterol (plaque) on the inside of the arteries. Over time, the plaque may narrow or block the artery, and this will lessen blood flow to the heart. Plaque can also become weak and break off within a coronary artery to form a clot and cause a sudden blockage. What increases the risk? The risk factors of this condition include:  High cholesterol levels.  High blood pressure (hypertension).  Smoking.  Diabetes.  Age.  Family history of chest pain, heart disease, or stroke.  Lack of exercise.  What are the signs or symptoms? The most common signs of this condition include:  Chest pain, which can be: ? A crushing or squeezing in the chest. ? A tightness, pressure, fullness, or heaviness in the chest. ? Present for more than a few minutes, or it can stop and recur.  Pain in the arms, neck, jaw, or back.  Unexplained heartburn or indigestion.  Shortness of breath.  Nausea.  Sudden cold sweats.  Feeling light-headed or dizzy.  Sometimes, this condition has no symptoms. How is this diagnosed? ACS may be diagnosed through the following tests:  Electrocardiogram  (ECG).  Blood tests.  Coronary angiogram. This is a procedure to look at the coronary arteries to see if there is any blockage.  How is this treated? Treatment for ACS may include:  Healthy behavioral changes to reduce or control risk factors.  Medicine.  Coronary stenting.A stent helps to keep an artery open.  Coronary angioplasty. This procedure widens a narrowed or blocked artery.  Coronary artery bypass surgery. This will allow your blood to pass the blockage (bypass) to reach your heart.  Follow these instructions at home: Eating and drinking  Follow a heart-healthy diet. A dietitian can you help to educate you about healthy food options and changes.  Use healthy cooking methods such as roasting, grilling, broiling, baking, poaching, steaming, or stir-frying. Talk to a dietitian to learn more about healthy cooking methods. Medicines  Take medicines only as directed by your health care provider.  Do not take the following medicines unless your health care provider approves: ? Nonsteroidal anti-inflammatory drugs (NSAIDs), such as ibuprofen, naproxen, or celecoxib. ? Vitamin supplements that contain vitamin A, vitamin E, or both. ? Hormone replacement therapy that contains estrogen with or without progestin.  Stop illegal drug use. Activity  Follow an exercise program that is approved by your health care provider.  Plan rest periods when you are fatigued. Lifestyle  Do not use any tobacco products, including cigarettes, chewing tobacco, or electronic cigarettes. If you need help quitting, ask your health care provider.  If you drink alcohol, and your health care provider approves, limit your alcohol intake to no more than 1 drink per day. One drink equals 12 ounces of beer, 5 ounces of wine, or 1 ounces of hard liquor.  Learn to manage stress.  Maintain a healthy weight. Lose weight as approved by your health care provider. General instructions  Manage other  health conditions, such as hypertension and diabetes, as directed by your health care provider.  Keep all follow-up visits as directed by your health care provider. This is important.  Your health care provider may ask you to monitor your blood pressure. A blood pressure reading consists of a higher number over a lower number, such as 110 over 72, written  as 110/72. Ideally, your blood pressure should be: ? Below 140/90 if you have no other medical conditions. ? Below 130/80 if you have diabetes or kidney disease. Get help right away if:  You have pain in your chest, neck, arm, jaw, stomach, or back that lasts more than a few minutes, is recurring, or is not relieved by taking medicine under your tongue (sublingual nitroglycerin).  You have profuse sweating without cause.  You have unexplained: ? Heartburn or indigestion. ? Shortness of breath or difficulty breathing. ? Nausea or vomiting. ? Fatigue. ? Feelings of nervousness or anxiety. ? Weakness. ? Diarrhea.  You have sudden light-headedness or dizziness.  You faint. These symptoms may represent a serious problem that is an emergency. Do not wait to see if the symptoms will go away. Get medical help right away. Call your local emergency services (911 in the [place] Do not drive yourself to the clinic or hospital. This information is not intended to replace advice given to you by your health care provider. Make sure you discuss any questions you have with your health care provider. Document Released: 01/04/2005 Document Revised: 06/18/2015 Document Reviewed: 05/08/2013 Elsevier Interactive Patient Education  2017 [place] Maintenance for Postmenopausal Women Menopause is a normal process in which your reproductive ability comes to an end. This process happens gradually over a span of months to years, usually between the ages of 66 and 84. Menopause is complete when you have missed 12 consecutive menstrual  periods. It is important to talk with your health care provider about some of the most common conditions that affect postmenopausal women, such as heart disease, cancer, and bone loss (osteoporosis). Adopting a healthy lifestyle and getting preventive care can help to promote your health and wellness. Those actions can also lower your chances of developing some of these common conditions. What should I know about menopause? During menopause, you may experience a number of symptoms, such as:  Moderate-to-severe hot flashes.  Night sweats.  Decrease in sex drive.  Mood swings.  Headaches.  Tiredness.  Irritability.  Memory problems.  Insomnia.  Choosing to treat or not to treat menopausal changes is an individual decision that you make with your health care provider. What should I know about hormone replacement therapy and supplements? Hormone therapy products are effective for treating symptoms that are associated with menopause, such as hot flashes and night sweats. Hormone replacement carries certain risks, especially as you become older. If you are thinking about using estrogen or estrogen with progestin treatments, discuss the benefits and risks with your health care provider. What should I know about heart disease and stroke? Heart disease, heart attack, and stroke become more likely as you age. This may be due, in part, to the hormonal changes that your body experiences during menopause. These can affect how your body processes dietary fats, triglycerides, and cholesterol. Heart attack and stroke are both medical emergencies. There are many things that you can do to help prevent heart disease and stroke:  Have your blood pressure checked at least every 1-2 years. High blood pressure causes heart disease and increases the risk of stroke.  If you are 34-51 years old, ask your health care provider if you should take aspirin to prevent a heart attack or a stroke.  Do not use any  tobacco products, including cigarettes, chewing tobacco, or electronic cigarettes. If you need help quitting, ask your health care provider.  It is important to eat a healthy diet  and maintain a healthy weight. ? Be sure to include plenty of vegetables, fruits, low-fat dairy products, and lean protein. ? Avoid eating foods that are high in solid fats, added sugars, or salt (sodium).  Get regular exercise. This is one of the most important things that you can do for your health. ? Try to exercise for at least 150 minutes each week. The type of exercise that you do should increase your heart rate and make you sweat. This is known as moderate-intensity exercise. ? Try to do strengthening exercises at least twice each week. Do these in addition to the moderate-intensity exercise.  Know your numbers.Ask your health care provider to check your cholesterol and your blood glucose. Continue to have your blood tested as directed by your health care provider.  What should I know about cancer screening? There are several types of cancer. Take the following steps to reduce your risk and to catch any cancer development as early as possible. Breast Cancer  Practice breast self-awareness. ? This means understanding how your breasts normally appear and feel. ? It also means doing regular breast self-exams. Let your health care provider know about any changes, no matter how small.  If you are 94 or older, have a clinician do a breast exam (clinical breast exam or CBE) every year. Depending on your age, family history, and medical history, it may be recommended that you also have a yearly breast X-ray (mammogram).  If you have a family history of breast cancer, talk with your health care provider about genetic screening.  If you are at high risk for breast cancer, talk with your health care provider about having an MRI and a mammogram every year.  Breast cancer (BRCA) gene test is recommended for women who  have family members with BRCA-related cancers. Results of the assessment will determine the need for genetic counseling and BRCA1 and for BRCA2 testing. BRCA-related cancers include these types: ? Breast. This occurs in males or females. ? Ovarian. ? Tubal. This may also be called fallopian tube cancer. ? Cancer of the abdominal or pelvic lining (peritoneal cancer). ? Prostate. ? Pancreatic.  Cervical, Uterine, and Ovarian Cancer Your health care provider may recommend that you be screened regularly for cancer of the pelvic organs. These include your ovaries, uterus, and vagina. This screening involves a pelvic exam, which includes checking for microscopic changes to the surface of your cervix (Pap test).  For women ages 21-65, health care providers may recommend a pelvic exam and a Pap test every three years. For women ages 6-65, they may recommend the Pap test and pelvic exam, combined with testing for human papilloma virus (HPV), every five years. Some types of HPV increase your risk of cervical cancer. Testing for HPV may also be done on women of any age who have unclear Pap test results.  Other health care providers may not recommend any screening for nonpregnant women who are considered low risk for pelvic cancer and have no symptoms. Ask your health care provider if a screening pelvic exam is right for you.  If you have had past treatment for cervical cancer or a condition that could lead to cancer, you need Pap tests and screening for cancer for at least 20 years after your treatment. If Pap tests have been discontinued for you, your risk factors (such as having a new sexual partner) need to be reassessed to determine if you should start having screenings again. Some women have medical problems that increase the  chance of getting cervical cancer. In these cases, your health care provider may recommend that you have screening and Pap tests more often.  If you have a family history of uterine  cancer or ovarian cancer, talk with your health care provider about genetic screening.  If you have vaginal bleeding after reaching menopause, tell your health care provider.  There are currently no reliable tests available to screen for ovarian cancer.  Lung Cancer Lung cancer screening is recommended for adults 27-66 years old who are at high risk for lung cancer because of a history of smoking. A yearly low-dose CT scan of the lungs is recommended if you:  Currently smoke.  Have a history of at least 30 pack-years of smoking and you currently smoke or have quit within the past 15 years. A pack-year is smoking an average of one pack of cigarettes per day for one year.  Yearly screening should:  Continue until it has been 15 years since you quit.  Stop if you develop a health problem that would prevent you from having lung cancer treatment.  Colorectal Cancer  This type of cancer can be detected and can often be prevented.  Routine colorectal cancer screening usually begins at age 36 and continues through age 25.  If you have risk factors for colon cancer, your health care provider may recommend that you be screened at an earlier age.  If you have a family history of colorectal cancer, talk with your health care provider about genetic screening.  Your health care provider may also recommend using home test kits to check for hidden blood in your stool.  A small camera at the end of a tube can be used to examine your colon directly (sigmoidoscopy or colonoscopy). This is done to check for the earliest forms of colorectal cancer.  Direct examination of the colon should be repeated every 5-10 years until age 9. However, if early forms of precancerous polyps or small growths are found or if you have a family history or genetic risk for colorectal cancer, you may need to be screened more often.  Skin Cancer  Check your skin from head to toe regularly.  Monitor any moles. Be sure to  tell your health care provider: ? About any new moles or changes in moles, especially if there is a change in a mole's shape or color. ? If you have a mole that is larger than the size of a pencil eraser.  If any of your family members has a history of skin cancer, especially at a young age, talk with your health care provider about genetic screening.  Always use sunscreen. Apply sunscreen liberally and repeatedly throughout the day.  Whenever you are outside, protect yourself by wearing long sleeves, pants, a wide-brimmed hat, and sunglasses.  What should I know about osteoporosis? Osteoporosis is a condition in which bone destruction happens more quickly than new bone creation. After menopause, you may be at an increased risk for osteoporosis. To help prevent osteoporosis or the bone fractures that can happen because of osteoporosis, the following is recommended:  If you are 13-68 years old, get at least 1,000 mg of calcium and at least 600 mg of vitamin D per day.  If you are older than age 62 but younger than age 52, get at least 1,200 mg of calcium and at least 600 mg of vitamin D per day.  If you are older than age 17, get at least 1,200 mg of calcium and at  least 800 mg of vitamin D per day.  Smoking and excessive alcohol intake increase the risk of osteoporosis. Eat foods that are rich in calcium and vitamin D, and do weight-bearing exercises several times each week as directed by your health care provider. What should I know about how menopause affects my mental health? Depression may occur at any age, but it is more common as you become older. Common symptoms of depression include:  Low or sad mood.  Changes in sleep patterns.  Changes in appetite or eating patterns.  Feeling an overall lack of motivation or enjoyment of activities that you previously enjoyed.  Frequent crying spells.  Talk with your health care provider if you think that you are experiencing  depression. What should I know about immunizations? It is important that you get and maintain your immunizations. These include:  Tetanus, diphtheria, and pertussis (Tdap) booster vaccine.  Influenza every year before the flu season begins.  Pneumonia vaccine.  Shingles vaccine.  Your health care provider may also recommend other immunizations. This information is not intended to replace advice given to you by your health care provider. Make sure you discuss any questions you have with your health care provider. Document Released: 02/26/2005 Document Revised: 07/25/2015 Document Reviewed: 10/08/2014 Elsevier Interactive Patient Education  2018 [employer].

## 2016-09-02 NOTE — Progress Notes (Signed)
Subjective:   Madison Hernandez is a 76 y.o. female who presents for Medicare Annual (Subsequent) preventive examination.  The Patient was informed that the wellness visit is to identify future health risk and educate and initiate measures that can reduce risk for increased disease through the lifespan.    Annual Wellness Assessment  Reports health as fair;  Preventive Screening -Counseling & Management  Medicare Annual Preventive Care Visit - Subsequent Last OV 08/14  Issues on admission:  Thyroid elevated; BX scheduled for nodule for 08/21 Educated her regarding this procedure   States she had some left arm pain this past week and left sided jaw pain in the past. Cardiology has seen recently. Not a very good historian due to anxiety   Preventive screens Colonoscopy 06.2012 and due 06/2020 Mammogram 12/2010 - states she does not have mammogram every year, but will schedule sometime after her thyroid bx   VS reviewed;  States bp fluctuates;  Heart will race and BP goes up at time  States thyroid is elevated now and causing these issue   Diet  Trying not to overeat 3 meals a day  breakfast and lunch and a small meal tonight  Does not eat after 3 or 4pm  Part of this is due to her hyperactivity due to high thyroid    BMI 25   Exercise; has not been able to exercise;  Flat footed and had vein surgery in the left leg and circulation is not good Did wear orthotics in right   Dental-no issues    Stressors: upcoming thyroid bx  Hearing Screening Comments: Does not have eardrum in her right ear Does hear in the left ear Has wax build up at times. Has had plenty of hearing test  Vision Screening Comments: Gets her eyes checked  rosacea which got in her eyes but now they are fine Dr. Herbert Deaner;     Sleep patterns: does not sleep at hs   Pain no pain now Has back pain on occasion    Cardiac Risk Factors Addressed Hyperlipidemia -chol/hdl ratio 6; chol 342; hdl  54; trig 143 LDL 259   Pre-diabetes  5.8 03/2015 Has cut back on cokes and lost weight   Advanced Directives Spent time reviewing HCPOA from [facility] Does not want to complete at this time  Patient Care Team: Eulas Post, MD as PCP - General (Family Medicine) Assessed for additional providers  Immunization History  Administered Date(s) Administered  . Influenza Split 12/15/2011, 09/29/2012  . Influenza, High Dose Seasonal PF 09/22/2013  . Pneumococcal Conjugate-13 01/18/2010, 11/06/2013  . Pneumococcal Polysaccharide-23 07/12/2016  . Td 01/20/1998   Required Immunizations needed today  Screening test up to date or reviewed for plan of completion Health Maintenance Due  Topic Date Due  . TETANUS/TDAP  01/21/2008  . MAMMOGRAM  01/05/2013  . INFLUENZA VACCINE  08/18/2016     Needs Tdap;will take at the pharmacy  Mammogram; may have another after her thyroid bx Did not confirm but recommended she fup    Cardiac Risk Factors include: family history of premature cardiovascular disease     Objective:     Vitals: BP (!) 142/60   Pulse 74   Ht 5\' 5"  (1.651 m)   Wt 152 lb (68.9 kg)   SpO2 98%   BMI 25.29 kg/m   Body mass index is 25.29 kg/m.   Tobacco History  Smoking Status  . Former Smoker  . Packs/day: 2.00  . Years: 10.00  .  Types: Cigarettes  . Quit date: 01/19/1983  Smokeless Tobacco  . Never Used     Counseling given: Yes not eligible for LDCT;   Past Medical History:  Diagnosis Date  . Acute ischemic colitis ([place]) 1/12  . Arthritis   . Bleeding ulcer    22 years ago  . Blood in stool   . Cardiac arrhythmia due to congenital heart disease   . Depression   . Diverticulitis   . Dyslipidemia   . Headache(784.0)   . Hyperlipidemia   . Hypertension   . Migraine   . UTI (urinary tract infection)    Past Surgical History:  Procedure Laterality Date  . bleeding ulcers     22 years ago  . vein procedures     right and left lower extremity  vein procedures   Family History  Problem Relation Age of Onset  . Heart disease Mother        age 72  . Diabetes Mother   . Hypertension Mother   . Hypertension Sister    History  Sexual Activity  . Sexual activity: Not on file    Outpatient Encounter Prescriptions as of 09/02/2016  Medication Sig  . amLODipine (NORVASC) 5 MG tablet Take 1 tablet (5 mg total) by mouth daily.  . benazepril (LOTENSIN) 20 MG tablet Take 1 tablet (20 mg total) by mouth daily.  Marland Kitchen FLUoxetine (PROZAC) 20 MG tablet Take 1 tablet (20 mg total) by mouth daily.  Marland Kitchen linaclotide (LINZESS) 145 MCG CAPS capsule Take 145 mcg by mouth daily before breakfast.  . pantoprazole (PROTONIX) 20 MG tablet Take 20 mg by mouth daily.   . traMADol (ULTRAM) 50 MG tablet Take 50 mg by mouth every 6 (six) hours as needed (for pain).    No facility-administered encounter medications on file as of 09/02/2016.     Activities of Daily Living In your present state of health, do you have any difficulty performing the following activities: 09/02/2016  Hearing? Y  Vision? N  Difficulty concentrating or making decisions? N  Walking or climbing stairs? N  Dressing or bathing? N  Doing errands, shopping? N  Preparing Food and eating ? N  Using the Toilet? N  In the past six months, have you accidently leaked urine? N  Do you have problems with loss of bowel control? N  Managing your Medications? N  Managing your Finances? N  Housekeeping or managing your Housekeeping? N  Some recent data might be hidden    Patient Care Team: Eulas Post, MD as PCP - General (Family Medicine)    Assessment:     Exercise Activities and Dietary recommendations Current Exercise Habits: Home exercise routine, Intensity: Mild  Goals    . maintain your independence          Stay healthy!      Fall Risk Fall Risk  09/02/2016 08/04/2015 07/01/2014 04/05/2013 03/09/2013  Falls in the past year? No No No No No   Depression Screen PHQ 2/9  Scores 09/02/2016 08/04/2015 07/01/2014 04/05/2013  PHQ - 2 Score 0 0 0 0     Cognitive Function   Ad8 score reviewed for issues:  Issues making decisions:  Less interest in hobbies / activities:  Repeats questions, stories (family complaining):  Trouble using ordinary gadgets (microwave, computer, phone):  Forgets the month or year:   Mismanaging finances:   Remembering appts:  Daily problems with thinking and/or memory: Ad8 score is=0       6CIT  Screen 09/02/2016  What Year? (No Data)  declined memory questions. States her memory is very good   Immunization History  Administered Date(s) Administered  . Influenza Split 12/15/2011, 09/29/2012  . Influenza, High Dose Seasonal PF 09/22/2013  . Pneumococcal Conjugate-13 01/18/2010, 11/06/2013  . Pneumococcal Polysaccharide-23 07/12/2016  . Td 01/20/1998   Screening Tests Health Maintenance  Topic Date Due  . TETANUS/TDAP  01/21/2008  . MAMMOGRAM  01/05/2013  . INFLUENZA VACCINE  08/18/2016  . DEXA SCAN  07/12/2017 (Originally 03/10/2007)  . COLONOSCOPY  06/20/2020  . PNA vac Low Risk Adult  Completed      Plan:   PCP Notes   Health Maintenance Ms. Shumard agreed to a tetanus at sometime in the future. Discussed having a repeat mammogram. She declined at this time but may consider later this year after her thyroid bx.  She has not had an eye exam in several years and advised to get one this year if she can.  Abnormal Screens  A1c is now 5.98 much improved over 6.7 in the past. States she has lost weight and paid attention to her food choices.  Referrals  No referrals at this time  Patient concerns;  The patient states she has had pain in left jaw, not in the recent past. Thought it may be related to med. Gradually went away over the day  States she did have pain going down her left arm this past week, not asso, with pain over chest area, sob, n, v or dizziness, fatigue Educated regarding chest pain in  women and would advise if this occurs again, to seek medical attention to rule out cardiac origin.  She agreed to do so   Nurse Concerns; Education provided for chest pain in women. Education also provided regarding prediabetes. Education provided regarding the procedure for thyroid biopsy. Reviewed advanced directives in detail. The patient is not interested in completing at this time.  The patient does not have Internet access set,  EMMI  could not be seen at  Next PCP apt the patient has an appointment in June with Dr. Elease Hashimoto. He scheduled a thyroid biopsy. Will follow-up as appropriate after testin      I have personally reviewed and noted the following in the patient's chart:   . Medical and social history . Use of alcohol, tobacco or illicit drugs  . Current medications and supplements . Functional ability and status . Nutritional status . Physical activity . Advanced directives . List of other physicians . Hospitalizations, surgeries, and ER visits in previous 12 months . Vitals . Screenings to include cognitive, depression, and falls . Referrals and appointments  In addition, I have reviewed and discussed with patient certain preventive protocols, quality metrics, and best practice recommendations. A written personalized care plan for preventive services as well as general preventive health recommendations were provided to patient.     Wynetta Fines, RN  09/02/2016

## 2016-09-07 ENCOUNTER — Ambulatory Visit
Admission: RE | Admit: 2016-09-07 | Discharge: 2016-09-07 | Disposition: A | Discharge status: Home / Self Care | Payer: Medicare PPO | Source: Ambulatory Visit | Attending: Family Medicine | Admitting: Family Medicine

## 2016-09-07 ENCOUNTER — Other Ambulatory Visit (HOSPITAL_COMMUNITY)
Admission: RE | Admit: 2016-09-07 | Discharge: 2016-09-07 | Disposition: A | Discharge status: Home / Self Care | Payer: Medicare PPO | Source: Ambulatory Visit | Attending: Radiology | Admitting: Radiology

## 2016-09-07 DIAGNOSIS — E041 Nontoxic single thyroid nodule: Secondary | ICD-10-CM | POA: Diagnosis present

## 2016-09-07 DIAGNOSIS — D34 Benign neoplasm of thyroid gland: Secondary | ICD-10-CM | POA: Insufficient documentation

## 2016-09-15 DIAGNOSIS — R3915 Urgency of urination: Secondary | ICD-10-CM | POA: Diagnosis not present

## 2016-09-15 DIAGNOSIS — N3001 Acute cystitis with hematuria: Secondary | ICD-10-CM | POA: Diagnosis not present

## 2016-10-04 ENCOUNTER — Telehealth: Payer: Self-pay | Admitting: Family Medicine

## 2016-10-04 MED ORDER — SERTRALINE HCL 50 MG PO TABS: 50.0000 mg | ORAL_TABLET | Freq: Every day | ORAL | 1 refills | Status: DC

## 2016-10-04 NOTE — Telephone Encounter (Signed)
Pt would like to go back on the sertraline state it works better than the fluoxetine and would like to see if that is okay with Dr. Elease Hashimoto.  Pharm:  [facility].

## 2016-10-04 NOTE — Telephone Encounter (Signed)
Yes.  May D/C Fluoxetine and start Sertraline 50 mg once daily.  I would probably give one week interval between stopping Prozac and starting the Zoloft secondary to long half life of Prozac.

## 2016-10-04 NOTE — Telephone Encounter (Signed)
Patient is aware.  Rx sent. med list updated.

## 2016-10-04 NOTE — Telephone Encounter (Signed)
Pt would like to change back to sertraline state it works better than the flu

## 2016-10-08 ENCOUNTER — Ambulatory Visit: Payer: Medicare PPO

## 2016-10-08 ENCOUNTER — Other Ambulatory Visit (INDEPENDENT_AMBULATORY_CARE_PROVIDER_SITE_OTHER): Payer: Medicare PPO

## 2016-10-08 DIAGNOSIS — Z23 Encounter for immunization: Secondary | ICD-10-CM | POA: Diagnosis not present

## 2016-10-08 LAB — TSH: TSH: 4.72 u[IU]/mL — AB (ref 0.35–4.50)

## 2016-10-08 LAB — T4, FREE: FREE T4: 0.82 ng/dL (ref 0.60–1.60)

## 2016-12-01 ENCOUNTER — Encounter: Payer: Self-pay | Admitting: Family Medicine

## 2016-12-01 ENCOUNTER — Ambulatory Visit: Payer: Medicare PPO | Admitting: Family Medicine

## 2016-12-01 VITALS — BP 138/70 | Temp 98.2°F | Ht 65.0 in | Wt 151.0 lb

## 2016-12-01 DIAGNOSIS — J018 Other acute sinusitis: Secondary | ICD-10-CM | POA: Diagnosis not present

## 2016-12-01 MED ORDER — AZITHROMYCIN 250 MG PO TABS
ORAL_TABLET | ORAL | 0 refills | Status: DC
Start: 2016-12-01 — End: 2017-01-07

## 2016-12-01 NOTE — Progress Notes (Signed)
   Subjective:    Patient ID: Madison Hernandez, female    DOB: 23-Nov-1940, 76 y.o.   MRN: [id number]  HPI Here for 2 weeks of sinus congestion, PND, a ST, and a dry cough. No fever.    Review of Systems  Constitutional: Negative.   HENT: Positive for congestion, postnasal drip, sinus pressure and sore throat. Negative for ear pain and sinus pain.   Eyes: Negative.   Respiratory: Positive for cough.        Objective:   Physical Exam  HENT:  Right Ear: External ear normal.  Left Ear: External ear normal.  Nose: Nose normal.  Mouth/Throat: Oropharynx is clear and moist.  Eyes: Conjunctivae are normal.  Neck: Neck supple. No thyromegaly present.  Pulmonary/Chest: Effort normal and breath sounds normal. No respiratory distress. She has no wheezes. She has no rales.  Lymphadenopathy:    She has no cervical adenopathy.          Assessment & Plan:  She has a sinusitis, treat with a Zpack. Drink fluids and add Advil prn.  Alysia Penna, MD

## 2016-12-01 NOTE — Patient Instructions (Signed)
WE NOW OFFER   [facility] FAST TRACK!!!  SAME DAY Appointments for ACUTE CARE  Such as: Sprains, Injuries, cuts, abrasions, rashes, muscle pain, joint pain, back pain Colds, flu, sore throats, headache, allergies, cough, fever  Ear pain, sinus and eye infections Abdominal pain, nausea, vomiting, diarrhea, upset stomach Animal/insect bites  3 Easy Ways to Schedule: Walk-In Scheduling Call in scheduling Mychart Sign-up: [email or web address]         

## 2017-01-07 ENCOUNTER — Ambulatory Visit: Payer: Medicare PPO | Admitting: Family Medicine

## 2017-01-07 ENCOUNTER — Encounter: Payer: Self-pay | Admitting: Family Medicine

## 2017-01-07 VITALS — BP 120/70 | HR 74 | Temp 97.3°F | Wt 150.3 lb

## 2017-01-07 DIAGNOSIS — I10 Essential (primary) hypertension: Secondary | ICD-10-CM | POA: Diagnosis not present

## 2017-01-07 DIAGNOSIS — L719 Rosacea, unspecified: Secondary | ICD-10-CM | POA: Diagnosis not present

## 2017-01-07 DIAGNOSIS — R7989 Other specified abnormal findings of blood chemistry: Secondary | ICD-10-CM

## 2017-01-07 LAB — TSH: TSH: 6.35 u[IU]/mL — AB (ref 0.35–4.50)

## 2017-01-07 LAB — T4, FREE: Free T4: 0.68 ng/dL (ref 0.60–1.60)

## 2017-01-07 MED ORDER — KETOCONAZOLE 2 % EX CREA: 1.0000 "application " | TOPICAL_CREAM | Freq: Every day | CUTANEOUS | 0 refills | Status: DC

## 2017-01-07 MED ORDER — METRONIDAZOLE 0.75 % EX CREA: TOPICAL_CREAM | Freq: Two times a day (BID) | CUTANEOUS | 0 refills | Status: DC

## 2017-01-07 NOTE — Progress Notes (Signed)
Subjective:     Patient ID: Madison Hernandez, female   DOB: Jun 26, 1940, 76 y.o.   MRN: [id number]  HPI Patient seen for medical follow-up  She has history of chronic anxiety and we had tried her on Prozac recently but then switched back to sertraline. She feels her symptoms are fairly stable currently on sertraline 50 mg daily  Hypertension treated with amlodipine and benazepril. No dizziness. No headaches.  Patient has history of rosacea. She is requesting refills of metronidazole cream which she only uses as needed  History of abnormal TSH. Mildly elevated back in the summertime. She has a fairly strong family history of hypothyroidism in mother, 2 sisters, and daughter. She's had some chronic hair loss issues. Occasional fatigue. No appetite or major weight changes. No cold intolerance.  Past Medical History:  Diagnosis Date  . Acute ischemic colitis ([place]) 1/12  . Arthritis   . Bleeding ulcer    22 years ago  . Blood in stool   . Cardiac arrhythmia due to congenital heart disease   . Depression   . Diverticulitis   . Dyslipidemia   . Headache(784.0)   . Hyperlipidemia   . Hypertension   . Migraine   . UTI (urinary tract infection)    Past Surgical History:  Procedure Laterality Date  . bleeding ulcers     22 years ago  . vein procedures     right and left lower extremity vein procedures    reports that she quit smoking about 33 years ago. Her smoking use included cigarettes. She has a 20.00 pack-year smoking history. she has never used smokeless tobacco. She reports that she does not drink alcohol or use drugs. family history includes Diabetes in her mother; Heart disease in her mother; Hypertension in her mother and sister. Allergies  Allergen Reactions  . Avelox [Moxifloxacin Hcl In Nacl] Shortness Of Breath  . Mucinex [Guaifenesin Er] Other (See Comments)    "Makes my heart flutter"  . Sulfa Antibiotics Hives     Review of Systems  Constitutional: Negative for  appetite change, fatigue and unexpected weight change.  Eyes: Negative for visual disturbance.  Respiratory: Negative for cough, chest tightness, shortness of breath and wheezing.   Cardiovascular: Negative for chest pain, palpitations and leg swelling.  Gastrointestinal: Negative for abdominal pain.  Endocrine: Negative for cold intolerance, polydipsia and polyuria.  Genitourinary: Negative for dysuria.  Neurological: Negative for dizziness, seizures, syncope, weakness, light-headedness and headaches.       Objective:   Physical Exam  Constitutional: She appears well-developed and well-nourished.  Eyes: Pupils are equal, round, and reactive to light.  Neck: Neck supple. No JVD present. No thyromegaly present.  Cardiovascular: Normal rate and regular rhythm. Exam reveals no gallop.  Pulmonary/Chest: Effort normal and breath sounds normal. No respiratory distress. She has no wheezes. She has no rales.  Musculoskeletal: She exhibits no edema.  Neurological: She is alert.       Assessment:     #1 hypertension stable and at goal  #2 chronic diffuse alopecia  #3 abnormal TSH with slightly elevated TSH recently  #4 rosacea    Plan:     -Recheck free T4 and TSH. TSH remains elevated consider replacement -Refilled metronidazole cream for as needed use -Continue current medications -Routine follow-up in 6 months and sooner as needed  Eulas Post MD [facility]

## 2017-01-10 ENCOUNTER — Other Ambulatory Visit: Payer: Self-pay

## 2017-01-10 DIAGNOSIS — R7989 Other specified abnormal findings of blood chemistry: Secondary | ICD-10-CM

## 2017-01-10 MED ORDER — LEVOTHYROXINE SODIUM 25 MCG PO TABS: 25.0000 ug | ORAL_TABLET | Freq: Every day | ORAL | 1 refills | Status: DC

## 2017-01-12 ENCOUNTER — Telehealth: Payer: Self-pay | Admitting: Family Medicine

## 2017-01-12 ENCOUNTER — Other Ambulatory Visit: Payer: Self-pay | Admitting: Family Medicine

## 2017-01-12 NOTE — Telephone Encounter (Signed)
Copied from [place]. Topic: Quick Communication - See Telephone Encounter >> Jan 12, 2017  2:49 PM Hewitt Shorts wrote: CRM for notification. See Telephone encounter for:  01/12/17.

## 2017-01-13 NOTE — Telephone Encounter (Signed)
Pt called back to check on this from yesterday...pt is needing to talk Dr. Elease Hashimoto about her thyroid medication she took it on Tuesday but got very dizzy could not focus and body felt hot and she has not taken it since but needs to talk with someone   Best number [phone]

## 2017-01-13 NOTE — Telephone Encounter (Signed)
I called the pt and she stated she felt the symptoms below 15 minutes after she took the thyroid medication and stated she does have panic attacks.  Stated she does not know if she had shortness of breath or not,  took a Benadryl and slept for 4 hours.  Message sent to Dr Elease Hashimoto to review.

## 2017-01-13 NOTE — Telephone Encounter (Signed)
I called the pt and informed her of the message below.  Appt scheduled for 1/22.

## 2017-01-13 NOTE — Telephone Encounter (Signed)
Hold medication for now. Let's plan office follow-up within one month to discuss

## 2017-01-26 ENCOUNTER — Other Ambulatory Visit: Payer: Self-pay | Admitting: Family Medicine

## 2017-02-08 ENCOUNTER — Ambulatory Visit: Payer: Medicare PPO | Admitting: Family Medicine

## 2017-03-14 ENCOUNTER — Other Ambulatory Visit (INDEPENDENT_AMBULATORY_CARE_PROVIDER_SITE_OTHER): Payer: Medicare PPO

## 2017-03-14 DIAGNOSIS — J020 Streptococcal pharyngitis: Secondary | ICD-10-CM | POA: Diagnosis not present

## 2017-03-14 DIAGNOSIS — R7989 Other specified abnormal findings of blood chemistry: Secondary | ICD-10-CM | POA: Diagnosis not present

## 2017-03-14 LAB — TSH: TSH: 6.67 u[IU]/mL — AB (ref 0.35–4.50)

## 2017-03-14 LAB — POCT RAPID STREP A (OFFICE): RAPID STREP A SCREEN: NEGATIVE

## 2017-03-15 ENCOUNTER — Telehealth: Payer: Self-pay | Admitting: Family Medicine

## 2017-03-15 NOTE — Telephone Encounter (Signed)
Pt given TSH lab results per notes of Dr. Elease Hashimoto on 03/15/17, patient verbalized understanding. She says "I have never taken levothyroxine. I took 1 pill and it made me dizzy, my heart was racing, so I didn't take anymore pills. He knows this. I don't know what he will do now." I advised this would be sent to him and someone will be back in touch with the recommendation, she verbalized understanding.  Unable to document in result note due to result note not being routed to [facility].

## 2017-03-16 ENCOUNTER — Other Ambulatory Visit: Payer: Self-pay | Admitting: Family Medicine

## 2017-03-16 DIAGNOSIS — E0590 Thyrotoxicosis, unspecified without thyrotoxic crisis or storm: Secondary | ICD-10-CM

## 2017-03-16 NOTE — Telephone Encounter (Signed)
FYI

## 2017-03-16 NOTE — Telephone Encounter (Signed)
Patient is calling and states she she has started taking her thyroid medication and she is not having any reaction to it. She states everything is going well.

## 2017-03-16 NOTE — Telephone Encounter (Signed)
We will follow for now.  As long as her TSH is not rising we can follow.  Do recommend 6 month follow up  TSH

## 2017-03-30 ENCOUNTER — Other Ambulatory Visit: Payer: Self-pay | Admitting: Family Medicine

## 2017-04-14 ENCOUNTER — Other Ambulatory Visit: Payer: Self-pay | Admitting: *Deleted

## 2017-04-14 MED ORDER — LEVOTHYROXINE SODIUM 25 MCG PO TABS: 25.0000 ug | ORAL_TABLET | Freq: Every day | ORAL | 0 refills | Status: DC

## 2017-04-14 NOTE — Telephone Encounter (Signed)
Rx done. 

## 2017-06-12 ENCOUNTER — Other Ambulatory Visit: Payer: Self-pay | Admitting: Family Medicine

## 2017-07-27 ENCOUNTER — Other Ambulatory Visit: Payer: Self-pay | Admitting: Family Medicine

## 2017-08-03 ENCOUNTER — Other Ambulatory Visit (INDEPENDENT_AMBULATORY_CARE_PROVIDER_SITE_OTHER): Payer: Medicare PPO

## 2017-08-03 DIAGNOSIS — E0590 Thyrotoxicosis, unspecified without thyrotoxic crisis or storm: Secondary | ICD-10-CM | POA: Diagnosis not present

## 2017-08-03 LAB — TSH: TSH: 1.14 u[IU]/mL (ref 0.35–4.50)

## 2017-08-05 DIAGNOSIS — J019 Acute sinusitis, unspecified: Secondary | ICD-10-CM | POA: Diagnosis not present

## 2017-08-05 DIAGNOSIS — I10 Essential (primary) hypertension: Secondary | ICD-10-CM | POA: Diagnosis not present

## 2017-08-12 ENCOUNTER — Other Ambulatory Visit: Payer: Self-pay | Admitting: Family Medicine

## 2017-08-31 ENCOUNTER — Other Ambulatory Visit: Payer: Self-pay | Admitting: Family Medicine

## 2017-09-01 ENCOUNTER — Other Ambulatory Visit: Payer: Self-pay | Admitting: Family Medicine

## 2017-09-06 ENCOUNTER — Ambulatory Visit (INDEPENDENT_AMBULATORY_CARE_PROVIDER_SITE_OTHER): Payer: Medicare PPO

## 2017-09-06 ENCOUNTER — Telehealth: Payer: Self-pay

## 2017-09-06 ENCOUNTER — Other Ambulatory Visit: Payer: Self-pay

## 2017-09-06 ENCOUNTER — Ambulatory Visit: Payer: Medicare PPO

## 2017-09-06 VITALS — BP 156/76 | HR 70 | Ht 65.0 in | Wt 163.1 lb

## 2017-09-06 DIAGNOSIS — Z0000 Encounter for general adult medical examination without abnormal findings: Secondary | ICD-10-CM

## 2017-09-06 DIAGNOSIS — L719 Rosacea, unspecified: Secondary | ICD-10-CM

## 2017-09-06 MED ORDER — METRONIDAZOLE 0.75 % EX CREA: TOPICAL_CREAM | Freq: Two times a day (BID) | CUTANEOUS | 0 refills | Status: DC

## 2017-09-06 MED ORDER — KETOCONAZOLE 2 % EX CREA: 1.0000 "application " | TOPICAL_CREAM | Freq: Every day | CUTANEOUS | 0 refills | Status: DC

## 2017-09-06 NOTE — Patient Instructions (Addendum)
Madison Hernandez , Thank you for taking time to come for your Medicare Wellness Visit. I appreciate your ongoing commitment to your health goals. Please review the following plan we discussed and let me know if I can assist you in the future.   Please schedule with Dr. Elease Hashimoto when you can for labs and fup   Shingrix is a vaccine for the prevention of Shingles in Adults 50 and older.  If you are on Medicare, the shingrix is covered under your Part D plan, so you will take both of the vaccines in the series at your pharmacy. Please check with your benefits regarding applicable copays or out of pocket expenses.  The Shingrix is given in 2 vaccines approx 8 weeks apart. You must receive the 2nd dose prior to 6 months from receipt of the first. Please have the pharmacist print out you Immunization  dates for our office records   A Tetanus is recommended every 10 years. Medicare covers a tetanus if you have a cut or wound; otherwise, there may be a charge. If you had not had a tetanus with pertusses, known as the Tdap, you can take this anytime.   Will show [facility] Advanced Directive to your dtr for discussion.  We need a copy of your health care agent if available   Please schedule an eye visit when you can.    These are the goals we discussed: Goals    . maintain your independence     Stay healthy!       This is a list of the screening recommended for you and due dates:  Health Maintenance  Topic Date Due  . DEXA scan (bone density measurement)  03/10/2007  . Tetanus Vaccine  01/21/2008  . Flu Shot  08/18/2017  . Colon Cancer Screening  06/20/2020  . Pneumonia vaccines  Completed      Fat and Cholesterol Restricted Diet Getting too much fat and cholesterol in your diet may cause health problems. Following this diet helps keep your fat and cholesterol at normal levels. This can keep you from getting sick. What types of fat should I choose?  Choose monosaturated and  polyunsaturated fats. These are found in foods such as olive oil, canola oil, flaxseeds, walnuts, almonds, and seeds.  Eat more omega-3 fats. Good choices include salmon, mackerel, sardines, tuna, flaxseed oil, and ground flaxseeds.  Limit saturated fats. These are in animal products such as meats, butter, and cream. They can also be in plant products such as palm oil, palm kernel oil, and coconut oil.  Avoid foods with partially hydrogenated oils in them. These contain trans fats. Examples of foods that have trans fats are stick margarine, some tub margarines, cookies, crackers, and other baked goods. What general guidelines do I need to follow?  Check food labels. Look for the words "trans fat" and "saturated fat."  When preparing a meal: ? Fill half of your plate with vegetables and green salads. ? Fill one fourth of your plate with whole grains. Look for the word "whole" as the first word in the ingredient list. ? Fill one fourth of your plate with lean protein foods.  Eat more foods that have fiber, like apples, carrots, beans, peas, and barley.  Eat more home-cooked foods. Eat less at restaurants and buffets.  Limit or avoid alcohol.  Limit foods high in starch and sugar.  Limit fried foods.  Cook foods without frying them. Baking, boiling, grilling, and broiling are all great options.  Lose  weight if you are overweight. Losing even a small amount of weight can help your overall health. It can also help prevent diseases such as diabetes and heart disease. What foods can I eat? Grains Whole grains, such as whole wheat or whole grain breads, crackers, cereals, and pasta. Unsweetened oatmeal, bulgur, barley, quinoa, or brown rice. Corn or whole wheat flour tortillas. Vegetables Fresh or frozen vegetables (raw, steamed, roasted, or grilled). Green salads. Fruits All fresh, canned (in natural juice), or frozen fruits. Meat and Other Protein Products Ground beef (85% or leaner),  grass-fed beef, or beef trimmed of fat. Skinless chicken or [place]. Ground chicken or [place]. Pork trimmed of fat. All fish and seafood. Eggs. Dried beans, peas, or lentils. Unsalted nuts or seeds. Unsalted canned or dry beans. Dairy Low-fat dairy products, such as skim or 1% milk, 2% or reduced-fat cheeses, low-fat ricotta or cottage cheese, or plain low-fat yogurt. Fats and Oils Tub margarines without trans fats. Light or reduced-fat mayonnaise and salad dressings. Avocado. Olive, canola, sesame, or safflower oils. Natural peanut or almond butter (choose ones without added sugar and oil). The items listed above may not be a complete list of recommended foods or beverages. Contact your dietitian for more options. What foods are not recommended? Grains White bread. White pasta. White rice. Cornbread. Bagels, pastries, and croissants. Crackers that contain trans fat. Vegetables White potatoes. Corn. Creamed or fried vegetables. Vegetables in a cheese sauce. Fruits Dried fruits. Canned fruit in light or heavy syrup. Fruit juice. Meat and Other Protein Products Fatty cuts of meat. Ribs, chicken wings, bacon, sausage, bologna, salami, chitterlings, fatback, hot dogs, bratwurst, and packaged luncheon meats. Liver and organ meats. Dairy Whole or 2% milk, cream, half-and-half, and cream cheese. Whole milk cheeses. Whole-fat or sweetened yogurt. Full-fat cheeses. Nondairy creamers and whipped toppings. Processed cheese, cheese spreads, or cheese curds. Sweets and Desserts Corn syrup, sugars, honey, and molasses. Candy. Jam and jelly. Syrup. Sweetened cereals. Cookies, pies, cakes, donuts, muffins, and ice cream. Fats and Oils Butter, stick margarine, lard, shortening, ghee, or bacon fat. Coconut, palm kernel, or palm oils. Beverages Alcohol. Sweetened drinks (such as sodas, lemonade, and fruit drinks or punches). The items listed above may not be a complete list of foods and beverages to avoid.  Contact your dietitian for more information. This information is not intended to replace advice given to you by your health care provider. Make sure you discuss any questions you have with your health care provider. Document Released: 07/06/2011 Document Revised: 09/11/2015 Document Reviewed: 04/05/2013 Elsevier Interactive Patient Education  2018 [place] Maintenance, Female Adopting a healthy lifestyle and getting preventive care can go a long way to promote health and wellness. Talk with your health care provider about what schedule of regular examinations is right for you. This is a good chance for you to check in with your provider about disease prevention and staying healthy. In between checkups, there are plenty of things you can do on your own. Experts have done a lot of research about which lifestyle changes and preventive measures are most likely to keep you healthy. Ask your health care provider for more information. Weight and diet Eat a healthy diet  Be sure to include plenty of vegetables, fruits, low-fat dairy products, and lean protein.  Do not eat a lot of foods high in solid fats, added sugars, or salt.  Get regular exercise. This is one of the most important things you can do for your health. ?  Most adults should exercise for at least 150 minutes each week. The exercise should increase your heart rate and make you sweat (moderate-intensity exercise). ? Most adults should also do strengthening exercises at least twice a week. This is in addition to the moderate-intensity exercise.  Maintain a healthy weight  Body mass index (BMI) is a measurement that can be used to identify possible weight problems. It estimates body fat based on height and weight. Your health care provider can help determine your BMI and help you achieve or maintain a healthy weight.  For females 2 years of age and older: ? A BMI below 18.5 is considered underweight. ? A BMI of 18.5 to 24.9  is normal. ? A BMI of 25 to 29.9 is considered overweight. ? A BMI of 30 and above is considered obese.  Watch levels of cholesterol and blood lipids  You should start having your blood tested for lipids and cholesterol at 75 years of age, then have this test every 5 years.  You may need to have your cholesterol levels checked more often if: ? Your lipid or cholesterol levels are high. ? You are older than 76 years of age. ? You are at high risk for heart disease.  Cancer screening Lung Cancer  Lung cancer screening is recommended for adults 88-69 years old who are at high risk for lung cancer because of a history of smoking.  A yearly low-dose CT scan of the lungs is recommended for people who: ? Currently smoke. ? Have quit within the past 15 years. ? Have at least a 30-pack-year history of smoking. A pack year is smoking an average of one pack of cigarettes a day for 1 year.  Yearly screening should continue until it has been 15 years since you quit.  Yearly screening should stop if you develop a health problem that would prevent you from having lung cancer treatment.  Breast Cancer  Practice breast self-awareness. This means understanding how your breasts normally appear and feel.  It also means doing regular breast self-exams. Let your health care provider know about any changes, no matter how small.  If you are in your 20s or 30s, you should have a clinical breast exam (CBE) by a health care provider every 1-3 years as part of a regular health exam.  If you are 29 or older, have a CBE every year. Also consider having a breast X-ray (mammogram) every year.  If you have a family history of breast cancer, talk to your health care provider about genetic screening.  If you are at high risk for breast cancer, talk to your health care provider about having an MRI and a mammogram every year.  Breast cancer gene (BRCA) assessment is recommended for women who have family members  with BRCA-related cancers. BRCA-related cancers include: ? Breast. ? Ovarian. ? Tubal. ? Peritoneal cancers.  Results of the assessment will determine the need for genetic counseling and BRCA1 and BRCA2 testing.  Cervical Cancer Your health care provider may recommend that you be screened regularly for cancer of the pelvic organs (ovaries, uterus, and vagina). This screening involves a pelvic examination, including checking for microscopic changes to the surface of your cervix (Pap test). You may be encouraged to have this screening done every 3 years, beginning at age 21.  For women ages 48-65, health care providers may recommend pelvic exams and Pap testing every 3 years, or they may recommend the Pap and pelvic exam, combined with testing for human  papilloma virus (HPV), every 5 years. Some types of HPV increase your risk of cervical cancer. Testing for HPV may also be done on women of any age with unclear Pap test results.  Other health care providers may not recommend any screening for nonpregnant women who are considered low risk for pelvic cancer and who do not have symptoms. Ask your health care provider if a screening pelvic exam is right for you.  If you have had past treatment for cervical cancer or a condition that could lead to cancer, you need Pap tests and screening for cancer for at least 20 years after your treatment. If Pap tests have been discontinued, your risk factors (such as having a new sexual partner) need to be reassessed to determine if screening should resume. Some women have medical problems that increase the chance of getting cervical cancer. In these cases, your health care provider may recommend more frequent screening and Pap tests.  Colorectal Cancer  This type of cancer can be detected and often prevented.  Routine colorectal cancer screening usually begins at 75 years of age and continues through 75 years of age.  Your health care provider may recommend  screening at an earlier age if you have risk factors for colon cancer.  Your health care provider may also recommend using home test kits to check for hidden blood in the stool.  A small camera at the end of a tube can be used to examine your colon directly (sigmoidoscopy or colonoscopy). This is done to check for the earliest forms of colorectal cancer.  Routine screening usually begins at age 61.  Direct examination of the colon should be repeated every 5-10 years through 75 years of age. However, you may need to be screened more often if early forms of precancerous polyps or small growths are found.  Skin Cancer  Check your skin from head to toe regularly.  Tell your health care provider about any new moles or changes in moles, especially if there is a change in a mole's shape or color.  Also tell your health care provider if you have a mole that is larger than the size of a pencil eraser.  Always use sunscreen. Apply sunscreen liberally and repeatedly throughout the day.  Protect yourself by wearing long sleeves, pants, a wide-brimmed hat, and sunglasses whenever you are outside.  Heart disease, diabetes, and high blood pressure  High blood pressure causes heart disease and increases the risk of stroke. High blood pressure is more likely to develop in: ? People who have blood pressure in the high end of the normal range (130-139/85-89 mm Hg). ? People who are overweight or obese. ? People who are African American.  If you are 54-69 years of age, have your blood pressure checked every 3-5 years. If you are 8 years of age or older, have your blood pressure checked every year. You should have your blood pressure measured twice-once when you are at a hospital or clinic, and once when you are not at a hospital or clinic. Record the average of the two measurements. To check your blood pressure when you are not at a hospital or clinic, you can use: ? An automated blood pressure machine at  a pharmacy. ? A home blood pressure monitor.  If you are between 46 years and 66 years old, ask your health care provider if you should take aspirin to prevent strokes.  Have regular diabetes screenings. This involves taking a blood sample to check your  fasting blood sugar level. ? If you are at a normal weight and have a low risk for diabetes, have this test once every three years after 75 years of age. ? If you are overweight and have a high risk for diabetes, consider being tested at a younger age or more often. Preventing infection Hepatitis B  If you have a higher risk for hepatitis B, you should be screened for this virus. You are considered at high risk for hepatitis B if: ? You were born in a country where hepatitis B is common. Ask your health care provider which countries are considered high risk. ? Your parents were born in a high-risk country, and you have not been immunized against hepatitis B (hepatitis B vaccine). ? You have HIV or AIDS. ? You use needles to inject street drugs. ? You live with someone who has hepatitis B. ? You have had sex with someone who has hepatitis B. ? You get hemodialysis treatment. ? You take certain medicines for conditions, including cancer, organ transplantation, and autoimmune conditions.  Hepatitis C  Blood testing is recommended for: ? Everyone born from [birth year] through 1963. ? Anyone with known risk factors for hepatitis C.  Sexually transmitted infections (STIs)  You should be screened for sexually transmitted infections (STIs) including gonorrhea and chlamydia if: ? You are sexually active and are younger than 75 years of age. ? You are older than 75 years of age and your health care provider tells you that you are at risk for this type of infection. ? Your sexual activity has changed since you were last screened and you are at an increased risk for chlamydia or gonorrhea. Ask your health care provider if you are at risk.  If you do  not have HIV, but are at risk, it may be recommended that you take a prescription medicine daily to prevent HIV infection. This is called pre-exposure prophylaxis (PrEP). You are considered at risk if: ? You are sexually active and do not regularly use condoms or know the HIV status of your partner(s). ? You take drugs by injection. ? You are sexually active with a partner who has HIV.  Talk with your health care provider about whether you are at high risk of being infected with HIV. If you choose to begin PrEP, you should first be tested for HIV. You should then be tested every 3 months for as long as you are taking PrEP. Pregnancy  If you are premenopausal and you may become pregnant, ask your health care provider about preconception counseling.  If you may become pregnant, take 400 to 800 micrograms (mcg) of folic acid every day.  If you want to prevent pregnancy, talk to your health care provider about birth control (contraception). Osteoporosis and menopause  Osteoporosis is a disease in which the bones lose minerals and strength with aging. This can result in serious bone fractures. Your risk for osteoporosis can be identified using a bone density scan.  If you are 46 years of age or older, or if you are at risk for osteoporosis and fractures, ask your health care provider if you should be screened.  Ask your health care provider whether you should take a calcium or vitamin D supplement to lower your risk for osteoporosis.  Menopause may have certain physical symptoms and risks.  Hormone replacement therapy may reduce some of these symptoms and risks. Talk to your health care provider about whether hormone replacement therapy is right for you. Follow these  instructions at home:  Schedule regular health, dental, and eye exams.  Stay current with your immunizations.  Do not use any tobacco products including cigarettes, chewing tobacco, or electronic cigarettes.  If you are  pregnant, do not drink alcohol.  If you are breastfeeding, limit how much and how often you drink alcohol.  Limit alcohol intake to no more than 1 drink per day for nonpregnant women. One drink equals 12 ounces of beer, 5 ounces of wine, or 1 ounces of hard liquor.  Do not use street drugs.  Do not share needles.  Ask your health care provider for help if you need support or information about quitting drugs.  Tell your health care provider if you often feel depressed.  Tell your health care provider if you have ever been abused or do not feel safe at home. This information is not intended to replace advice given to you by your health care provider. Make sure you discuss any questions you have with your health care provider. Document Released: 07/20/2010 Document Revised: 06/12/2015 Document Reviewed: 10/08/2014 Elsevier Interactive Patient Education  2018 [place] A mammogram is an X-ray of the breasts that is done to check for abnormal changes. This procedure can screen for and detect any changes that may suggest breast cancer. A mammogram can also identify other changes and variations in the breast, such as:  Inflammation of the breast tissue (mastitis).  An infected area that contains a collection of pus (abscess).  A fluid-filled sac (cyst).  Fibrocystic changes. This is when breast tissue becomes denser, which can make the tissue feel rope-like or uneven under the skin.  Tumors that are not cancerous (benign).  Tell a health care provider about:  Any allergies you have.  If you have breast implants.  If you have had previous breast disease, biopsy, or surgery.  If you are breastfeeding.  Any possibility that you could be pregnant, if this applies.  If you are younger than age 76.  If you have a family history of breast cancer. What are the risks? Generally, this is a safe procedure. However, problems may occur, including:  Exposure to  radiation. Radiation levels are very low with this test.  The results being misinterpreted.  The need for further tests.  The inability of the mammogram to detect certain cancers.  What happens before the procedure?  Schedule your test about 1-2 weeks after your menstrual period. This is usually when your breasts are the least tender.  If you have had a mammogram done at a different facility in the past, get the mammogram X-rays or have them sent to your current exam facility in order to compare them.  Wash your breasts and under your arms the day of the test.  Do not wear deodorants, perfumes, lotions, or powders anywhere on your body on the day of the test.  Remove any jewelry from your neck.  Wear clothes that you can change into and out of easily. What happens during the procedure?  You will undress from the waist up and put on a gown.  You will stand in front of the X-ray machine.  Each breast will be placed between two plastic or glass plates. The plates will compress your breast for a few seconds. Try to stay as relaxed as possible during the procedure. This does not cause any harm to your breasts and any discomfort you feel will be very brief.  X-rays will be taken from different angles of  each breast. The procedure may vary among health care providers and hospitals. What happens after the procedure?  The mammogram will be examined by a specialist (radiologist).  You may need to repeat certain parts of the test, depending on the quality of the images. This is commonly done if the radiologist needs a better view of the breast tissue.  Ask when your test results will be ready. Make sure you get your test results.  You may resume your normal activities. This information is not intended to replace advice given to you by your health care provider. Make sure you discuss any questions you have with your health care provider. Document Released: 01/02/2000 Document Revised:  06/09/2015 Document Reviewed: 03/15/2014 Elsevier Interactive Patient Education  [employer].

## 2017-09-06 NOTE — Telephone Encounter (Signed)
/  Patient was in for AWV.08/20 States she thought her apt was with Dr. Elease Hashimoto.  Last apt 01/07/17 and routine fup in 6 months requsested  Labs 08/05/2016 BMP  Advised the patient to make an apt for labs and other c/o but she declined. Offered 4pm for Dr. Elease Hashimoto today  and she declined  Needs refill amlodipine (norvasc) 5mg ; take one tablet every day  Refill at mail order pharmacy  Please advise Tks

## 2017-09-06 NOTE — Telephone Encounter (Signed)
Refill OK for 3 months. 

## 2017-09-06 NOTE — Progress Notes (Addendum)
Subjective:   Madison Hernandez is a 77 y.o. female who presents for Medicare Annual (Subsequent) preventive examination.  Reports health as fair C/o of pain in left shoulder and generalized OA  Seen dr. Elease Hashimoto 12/2016 and planned 6 month apt  Encouraged apt today but she declined.   States she hurt her left arm. Started this year  Hurt it exercising a long time ago  States her last MRI "showed" that she hurt her left shoulder She did not know she had an apt with [facility] nurse, She thought she was seeing the MD today  LTC plan is to move in apt close to her dtr in [place]  Diet BMI 27 08/2016; chol/hdl 6 Chol 342; hdl 54; ldl 259 and trig 143 Has been on crestor and would like to check again A1c 5.8 03/2015  Breakfast  Drinks a lot of soft drinks and a lot of tea Does not smoke or drink Lunch open up pasta  Sandwiches Discussed fiber but she cannot tolerate   Exercise Feet and legs hurt; Limps a lot per her report post vein surgery  Was hurting long before crestor   Health Maintenance Due  Topic Date Due  . INFLUENZA VACCINE  08/18/2017   Declines bone density  Discussed calcium intake   Colonoscopy 06/2010 due in 06/2020 Mammogram 12/2010- declines mammogram  No breast cancer or abnormal sbe  Educated on shingles  Flu vaccine not available at this time  Cardiac Risk Factors include: advanced age (>62men, >11 women);dyslipidemia;family history of premature cardiovascular disease;hypertension        Objective:     Vitals: BP (!) 156/76   Pulse 70   Ht 5\' 5"  (1.651 m)   Wt 163 lb 2 oz (74 kg)   SpO2 98%   BMI 27.15 kg/m   Body mass index is 27.15 kg/m.  BP elevated today but slightly anxious;   Advanced Directives 09/06/2017 09/02/2016 08/05/2016 06/07/2016 12/03/2015 12/01/2015 03/20/2014  Does Patient Have a Medical Advance Directive? Yes No No No No No No  Would patient like information on creating a medical advance directive? - - - - No - patient declined  information No - patient declined information No - patient declined information    Tobacco Social History   Tobacco Use  Smoking Status Former Smoker  . Packs/day: 2.00  . Years: 10.00  . Pack years: 20.00  . Types: Cigarettes  . Last attempt to quit: 01/19/1983  . Years since quitting: 34.6  Smokeless Tobacco Never Used     Counseling given: Yes   Clinical Intake:      Past Medical History:  Diagnosis Date  . Acute ischemic colitis ([place]) 1/12  . Arthritis   . Bleeding ulcer    22 years ago  . Blood in stool   . Cardiac arrhythmia due to congenital heart disease   . Depression   . Diverticulitis   . Dyslipidemia   . Headache(784.0)   . Hyperlipidemia   . Hypertension   . Migraine   . UTI (urinary tract infection)    Past Surgical History:  Procedure Laterality Date  . bleeding ulcers     22 years ago  . vein procedures     right and left lower extremity vein procedures   Family History  Problem Relation Age of Onset  . Heart disease Mother        age 76  . Diabetes Mother   . Hypertension Mother   . Hypertension Sister  Social History   Socioeconomic History  . Marital status: Single    Spouse name: Not on file  . Number of children: Not on file  . Years of education: Not on file  . Highest education level: Not on file  Occupational History  . Not on file  Social Needs  . Financial resource strain: Not on file  . Food insecurity:    Worry: Not on file    Inability: Not on file  . Transportation needs:    Medical: Not on file    Non-medical: Not on file  Tobacco Use  . Smoking status: Former Smoker    Packs/day: 2.00    Years: 10.00    Pack years: 20.00    Types: Cigarettes    Last attempt to quit: 01/19/1983    Years since quitting: 34.6  . Smokeless tobacco: Never Used  Substance and Sexual Activity  . Alcohol use: No  . Drug use: No  . Sexual activity: Not on file  Lifestyle  . Physical activity:    Days per week: Not on file     Minutes per session: Not on file  . Stress: Not on file  Relationships  . Social connections:    Talks on phone: Not on file    Gets together: Not on file    Attends religious service: Not on file    Active member of club or organization: Not on file    Attends meetings of clubs or organizations: Not on file    Relationship status: Not on file  Other Topics Concern  . Not on file  Social History Narrative   She retired early. She is non smoker. Never knew her father. Her mother died age 55 of heart disease. Her half sister [name]    Outpatient Encounter Medications as of 09/06/2017  Medication Sig  . amLODipine (NORVASC) 5 MG tablet TAKE 1 TABLET EVERY DAY  . benazepril (LOTENSIN) 20 MG tablet TAKE 1 TABLET EVERY DAY (NEED MD APPOINTMENT)  . ketoconazole (NIZORAL) 2 % cream Apply 1 application topically daily.  Marland Kitchen levothyroxine (SYNTHROID, LEVOTHROID) 25 MCG tablet TAKE ONE TABLET BY MOUTH DAILY BEFORE BREAKFAST  . linaclotide (LINZESS) 145 MCG CAPS capsule Take 145 mcg by mouth daily before breakfast.  . metroNIDAZOLE (METROCREAM) 0.75 % cream Apply topically 2 (two) times daily.  . pantoprazole (PROTONIX) 20 MG tablet Take 20 mg by mouth daily.   . rosuvastatin (CRESTOR) 20 MG tablet TAKE ONE TABLET BY MOUTH DAILY  . sertraline (ZOLOFT) 50 MG tablet Take 1 tablet (50 mg total) by mouth daily. (Patient not taking: Reported on 09/06/2017)  . traMADol (ULTRAM) 50 MG tablet Take 50 mg by mouth every 6 (six) hours as needed (for pain).   . [DISCONTINUED] levothyroxine (SYNTHROID) 25 MCG tablet Take 1 tablet (25 mcg total) by mouth daily before breakfast.   No facility-administered encounter medications on file as of 09/06/2017.     Activities of Daily Living In your present state of health, do you have any difficulty performing the following activities: 09/06/2017  Hearing? Y  Vision? N  Difficulty concentrating or making decisions? N  Walking or climbing stairs? Y  Comment  it's hard but she can hold on  Dressing or bathing? N  Doing errands, shopping? N  Preparing Food and eating ? N  Using the Toilet? N  In the past six months, have you accidently leaked urine? N  Do you have problems with loss of bowel control? Darreld Mclean  Comment corrected by linzess  Managing your Medications? N  Managing your Finances? N  Housekeeping or managing your Housekeeping? N  Some recent data might be hidden    Patient Care Team: Eulas Post, MD as PCP - General (Family Medicine)    Assessment:   This is a routine wellness examination for Babbette.  Exercise Activities and Dietary recommendations Current Exercise Habits: Home exercise routine(states legs hurt and can't exercise )  Goals    . maintain your independence     Stay healthy!       Fall Risk Fall Risk  09/06/2017 09/02/2016 08/04/2015 07/01/2014 04/05/2013  Falls in the past year? No No No No No     Depression Screen PHQ 2/9 Scores 09/06/2017 09/02/2016 08/04/2015 07/01/2014  PHQ - 2 Score 0 0 0 0     Cognitive Function Anxious so hard to evaluate  Hearing is an issue  MMSE - Mini Mental State Exam 09/06/2017  Attention/ Calculation 3  Recall 2     6CIT Screen 09/06/2017 09/02/2016  What Year? 0 points (No Data)  What month? 0 points -  What time? 0 points -  Count back from 20 0 points -  Months in reverse 0 points -  Repeat phrase 0 points -  Total Score 0 -    Immunization History  Administered Date(s) Administered  . Influenza Split 12/15/2011, 09/29/2012  . Influenza, High Dose Seasonal PF 09/22/2013, 10/08/2016  . Pneumococcal Conjugate-13 01/18/2010, 11/06/2013  . Pneumococcal Polysaccharide-23 07/12/2016  . Td 01/20/1998     Screening Tests Health Maintenance  Topic Date Due  . INFLUENZA VACCINE  08/18/2017  . DEXA SCAN  09/07/2018 (Originally 03/10/2007)  . TETANUS/TDAP  09/07/2018 (Originally 01/21/2008)  . COLONOSCOPY  06/20/2020  . PNA vac Low Risk Adult  Completed          Plan:      PCP Notes   Health Maintenance Declines bone density  Discussed calcium intake   Colonoscopy 06/2010 due in 06/2020 Mammogram 12/2010- declines mammogram  No breast cancer or abnormal sbe  Educated on shingles   If she sustains an injury, she will get her tetanus; otherwise can't afford it    Abnormal Screens  Hearing but has tried hearing aid on the left  BP elevated but needs amlodipine ordered for mail order  Hyperlipidemia- chol/hdl ratio 6 from 10  States she is taking chol;  C/o of leg pain but states this is due to varicose v surgery    Referrals  None; mentioned seeing Dr. Elease Hashimoto and going to sports medicine but felt she could not afford this   Patient concerns; Amlodipine needs reorder to mail pharmacy/ basket note sent due to need for BMP or other and declined apt.  Creams need to be refilled; ( Nizoral for body and Metronidazole for face)  Creams refilled at [facility] by Wynetta Fines RN/ VO Dr. Elease Hashimoto   Trying save money for emergency  States she hurt her left arm. Started this year  Hurt it exercising a long time ago  States her last MRI "showed" that she hurt her left shoulder She did not know she had an apt with [facility] nurse, She thought she was seeing the MD today  Recommended she make an apt with Dr. Elease Hashimoto for labs and fup regarding her issues but she declined. Stated she may wait until the first of the year    Nurse Concerns; Meds need refill Anxious regarding memory but 6CIT was normal  Less anxious when leaving   Next PCP apt TBS but explained she needed blood work for refills. States she will come in January. (thought she was seeing MD today )    I have personally reviewed and noted the following in the patient's chart:   . Medical and social history . Use of alcohol, tobacco or illicit drugs  . Current medications and supplements . Functional ability and status . Nutritional status . Physical  activity . Advanced directives . List of other physicians . Hospitalizations, surgeries, and ER visits in previous 12 months . Vitals . Screenings to include cognitive, depression, and falls . Referrals and appointments  In addition, I have reviewed and discussed with patient certain preventive protocols, quality metrics, and best practice recommendations. A written personalized care plan for preventive services as well as general preventive health recommendations were provided to patient.     BVQXI,HWTUU, RN  09/06/2017  I have reviewed the documentation for the [facility] and [place] provided by the health coach and agree with their documentation. I was immediately available for any questions  Eulas Post MD [facility]

## 2017-09-08 ENCOUNTER — Other Ambulatory Visit: Payer: Self-pay

## 2017-09-08 DIAGNOSIS — I10 Essential (primary) hypertension: Secondary | ICD-10-CM

## 2017-09-08 MED ORDER — AMLODIPINE BESYLATE 5 MG PO TABS: 5.0000 mg | ORAL_TABLET | Freq: Every day | ORAL | 0 refills | Status: DC

## 2017-09-08 NOTE — Progress Notes (Signed)
Reordered Amlodipine to mail order pharmacy for 3 month supply, no refills and sent Mail order per the patient's request  Wynetta Fines RN

## 2017-09-08 NOTE — Telephone Encounter (Signed)
Will refill per order/ Wynetta Fines rN

## 2017-09-29 IMAGING — US US THYROID
1 series · 13 of 25 positions shown · non-contrast
Comparison: None.

CLINICAL DATA: Incidental on MRI. The report says left-sided
nodules, but the abnormality on the MRI was actually within the
right thyroid lobe. This corresponds appropriately to the findings
in this study.

EXAM:
THYROID ULTRASOUND
TECHNIQUE: Ultrasound examination of the thyroid gland and adjacent soft
tissues was performed.

[Series 1: us thyroid · 0.08mm/px · 13 of 50 slices shown]
[im 1/50]
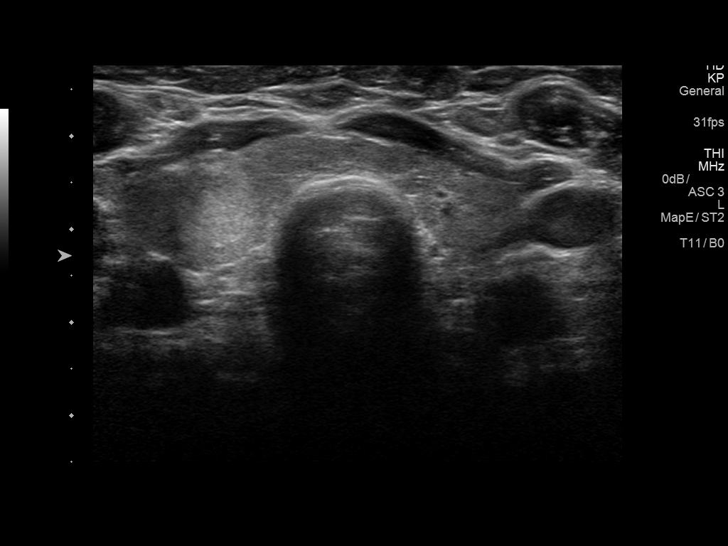
[im 5/50]
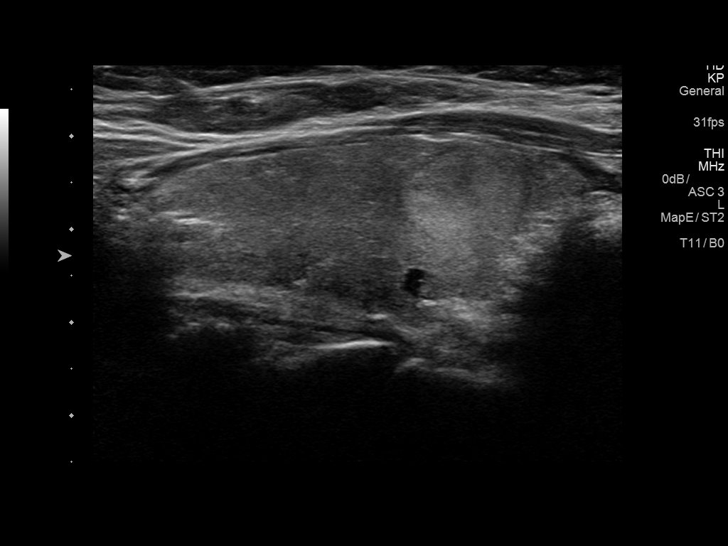
[im 9/50]
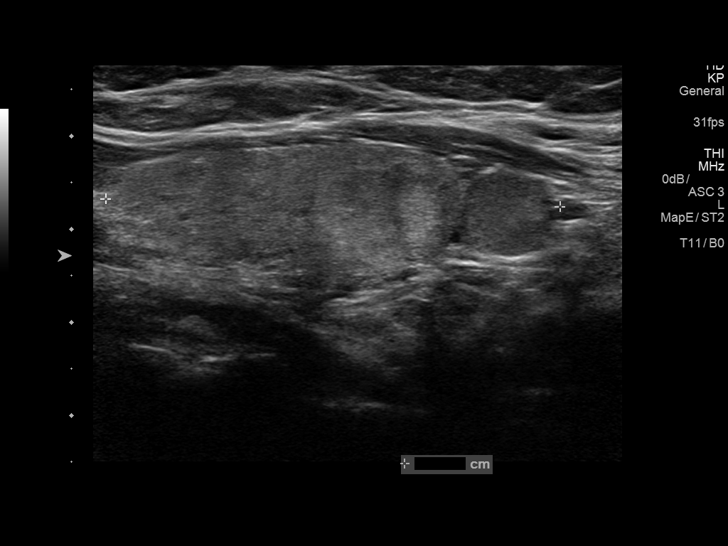
[im 13/50]
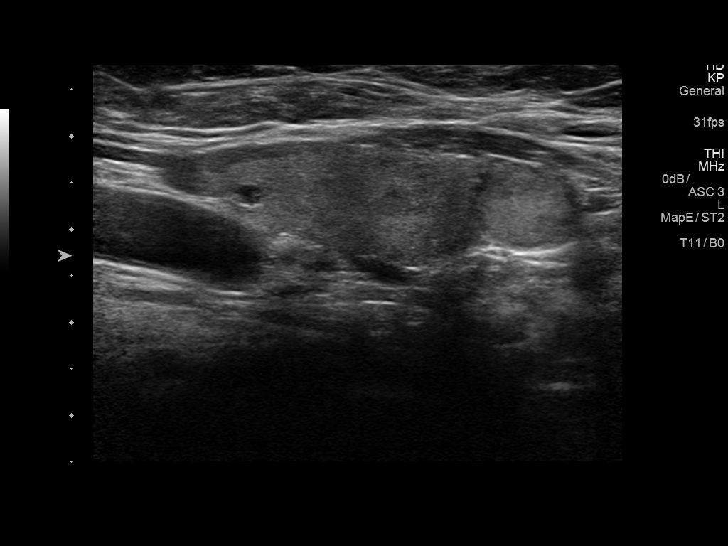
[im 17/50]
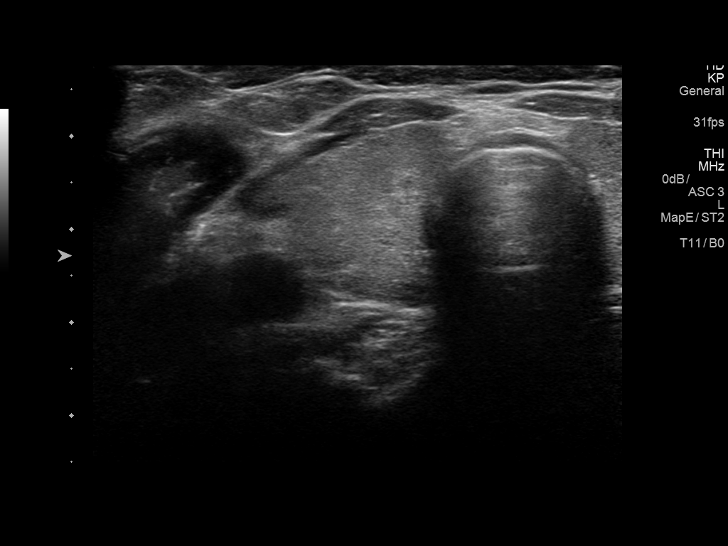
[im 21/50]
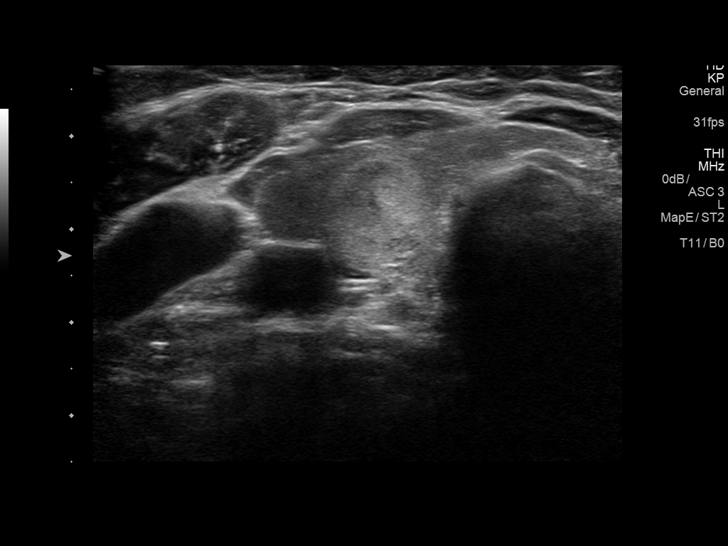
[im 25/50]
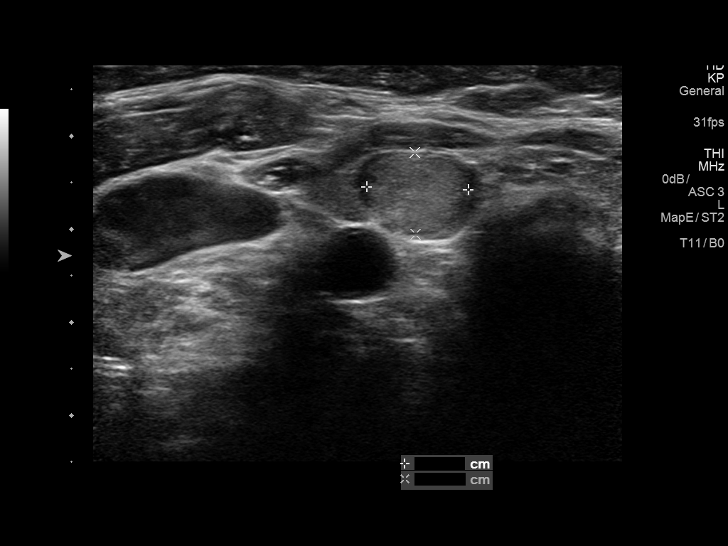
[im 29/50]
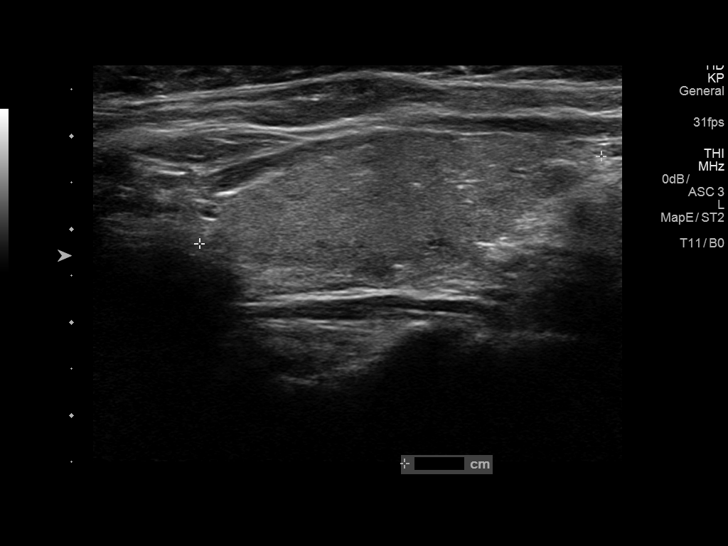
[im 33/50]
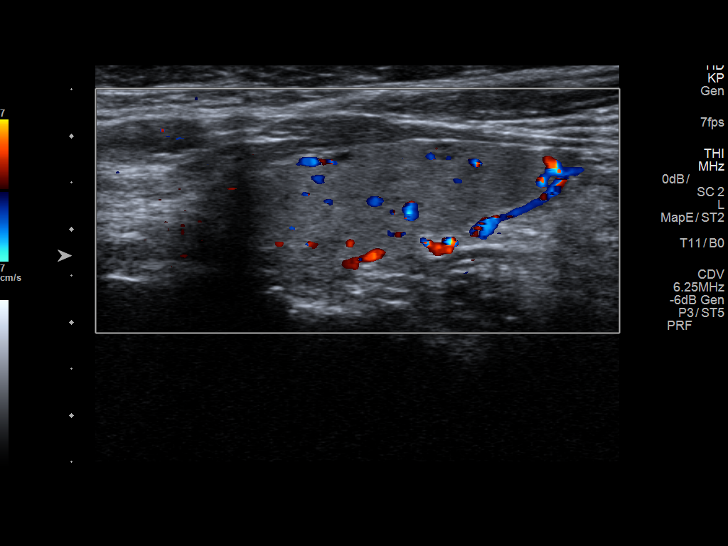
[im 37/50]
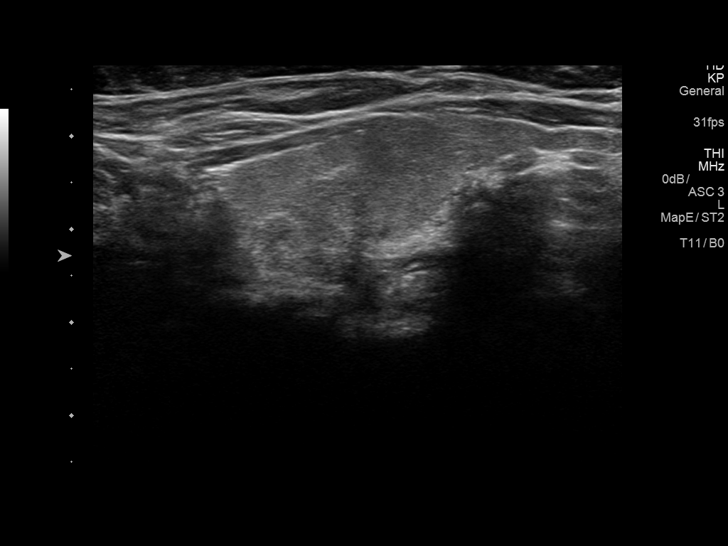
[im 41/50]
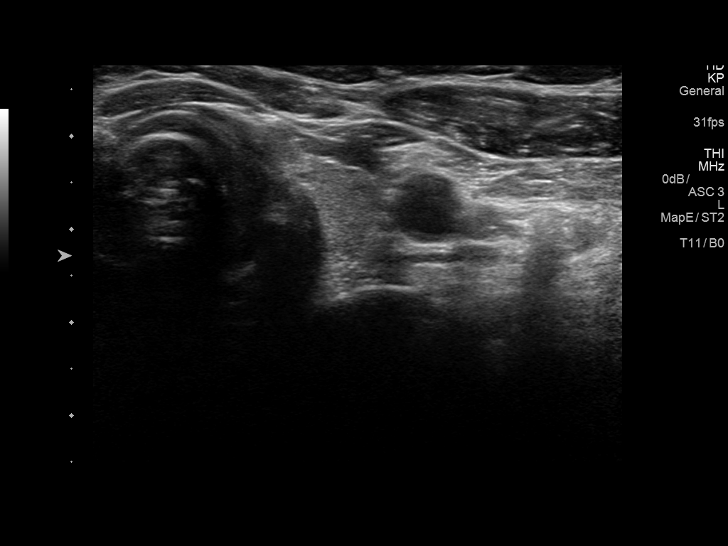
[im 45/50]
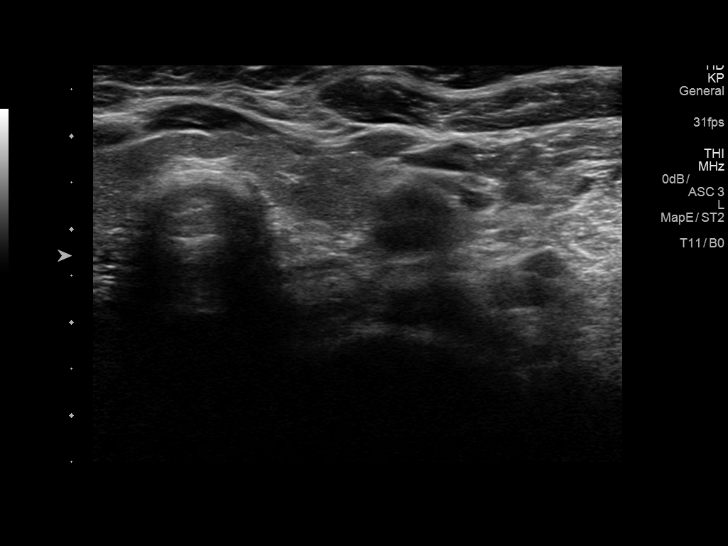
[im 50/50]
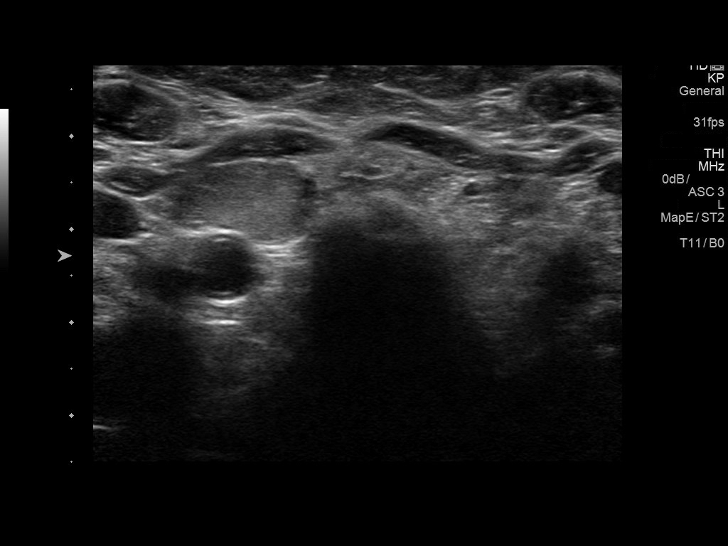

[13 of 25 positions shown; findings below may reference images not displayed]

FINDINGS: Parenchymal Echotexture: Mildly heterogenous

Isthmus: 0.4 cm

Right lobe: 4.9 x 1.9 x 2.2 cm

Left lobe: 4.4 x 1.6 x 1.7 cm

_________________________________________________________

Estimated total number of nodules >/= 1 cm: 2

Number of spongiform nodules >/=  2 cm not described below (TR1): 0

Number of mixed cystic and solid nodules >/= 1.5 cm not described
below (TR2): 0

_________________________________________________________

Nodule # 1:

Location: Right; Mid

Maximum size: 1.5 cm; Other 2 dimensions: 1.2 x 1.2 cm

Composition: solid/almost completely solid (2)

Echogenicity: hypoechoic (2)

Shape: not taller-than-wide (0)

Margins: smooth (0)

Echogenic foci: none (0)

ACR TI-RADS total points: 4.

ACR TI-RADS risk category: TR4 (4-6 points).

ACR TI-RADS recommendations:

**Given size (>/= 1.5 cm) and appearance, fine needle aspiration of
this moderately suspicious nodule should be considered based on
TI-RADS criteria.

_________________________________________________________

Nodule # 2:

Location: Right; Inferior

Maximum size: 1.1 cm; Other 2 dimensions: 1.1 x 0.9 cm

Composition: solid/almost completely solid (2)

Echogenicity: isoechoic (1)

Shape: not taller-than-wide (0)

Margins: smooth (0)

Echogenic foci: none (0)

ACR TI-RADS total points: 3.

ACR TI-RADS risk category: TR3 (3 points).

ACR TI-RADS recommendations:

Given size (<1.4 cm) and appearance, this nodule does NOT meet
TI-RADS criteria for biopsy or dedicated follow-up.

_________________________________________________________
IMPRESSION: The abnormality on the MRI was actually within the right lobe of the
thyroid gland. On today's study, 2 right-sided thyroid nodules are
identified corresponding to the MR abnormality.

Nodule 1 meets criteria for biopsy.

Nodule 2 does not meet criteria for biopsy no more follow-up.

The above is in keeping with the ACR TI-RADS recommendations - [HOSPITAL] 2944;[DATE].

## 2017-10-17 ENCOUNTER — Other Ambulatory Visit: Payer: Self-pay

## 2017-10-17 MED ORDER — LEVOTHYROXINE SODIUM 25 MCG PO TABS: ORAL_TABLET | ORAL | 0 refills | Status: DC

## 2017-10-18 IMAGING — US US THYROID BIOPSY
1 series · 13 of 13 positions shown · non-contrast
Comparison: US Thyroid 08/19/2016

MEDICATIONS:
5 cc 1% lidocaine

COMPLICATIONS:
None immediate.

INDICATION: Right mid lobe thyroid nodule

1.5 cm
EXAM:
ULTRASOUND GUIDED FINE NEEDLE ASPIRATION OF INDETERMINATE THYROID
NODULE
TECHNIQUE: Informed written consent was obtained from the patient after a
discussion of the risks, benefits and alternatives to treatment.
Questions regarding the procedure were encouraged and answered. A
timeout was performed prior to the initiation of the procedure.

[Series 1: us thyroid biopsy · 0.05mm/px · 13 acquisitions, 13 frames shown]
[im 1/13]
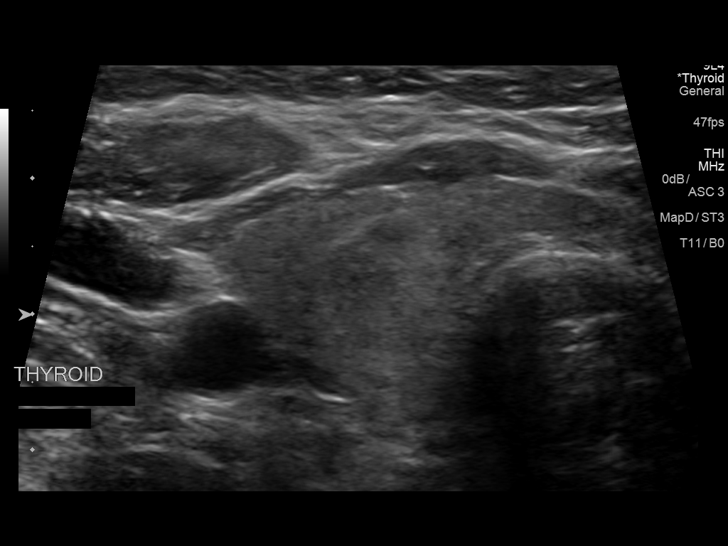
[im 2/13]
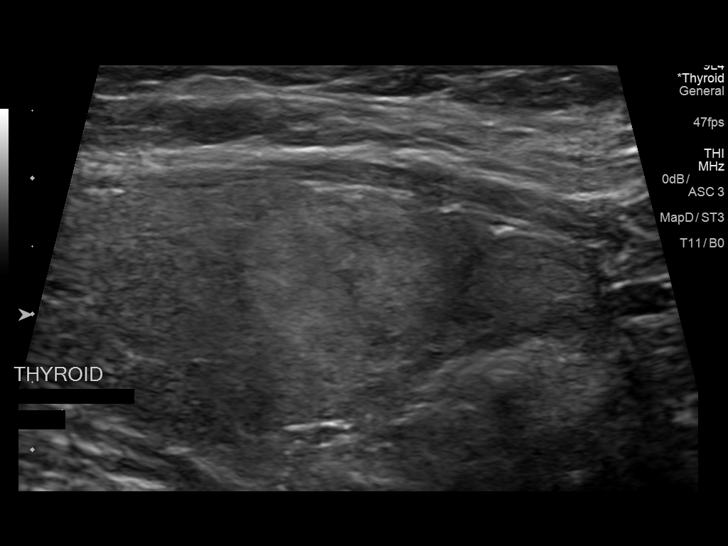
[im 3/13]
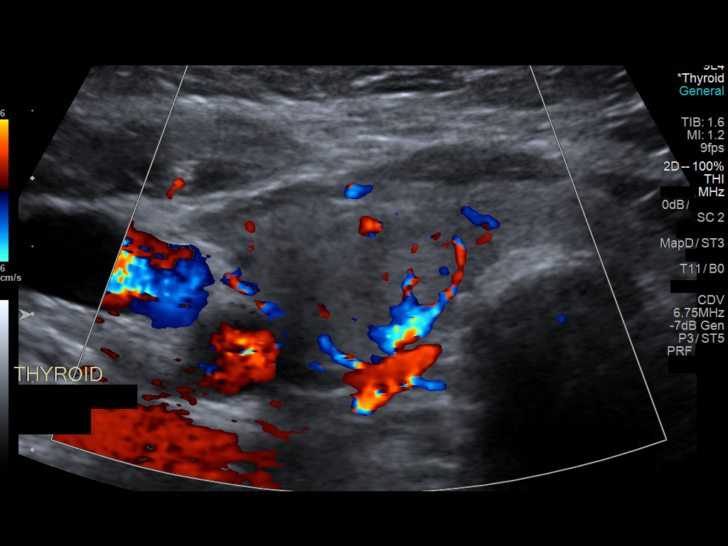
[im 4/13]
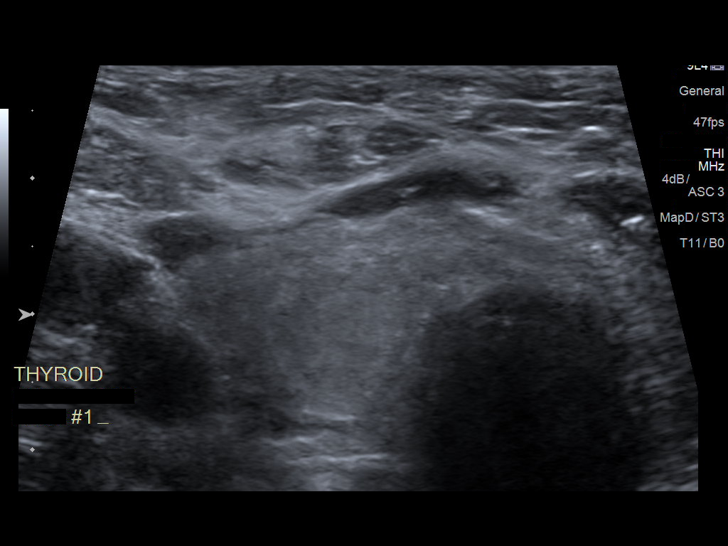
[im 5/13]
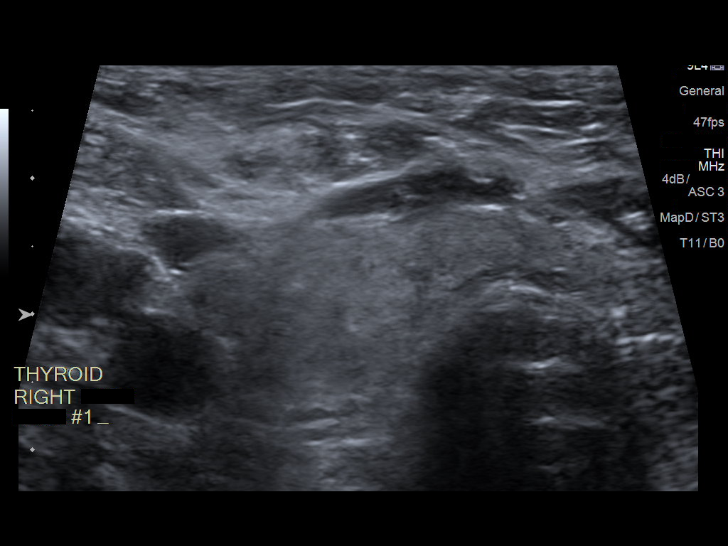
[im 6/13]
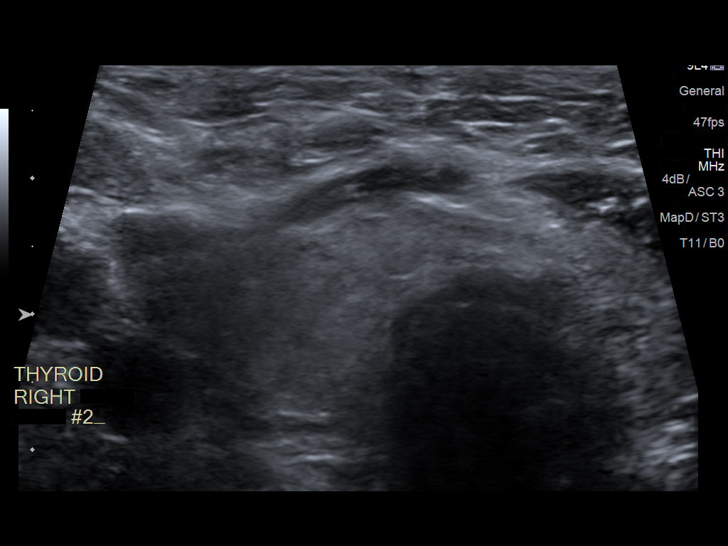
[im 7/13]
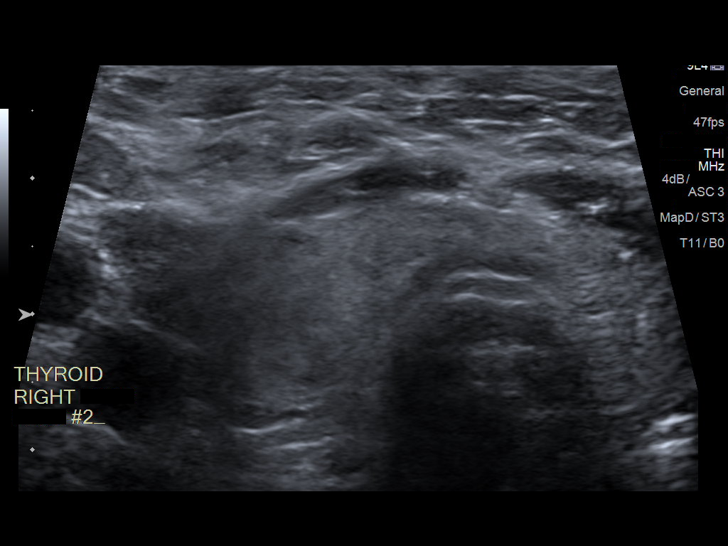
[im 8/13]
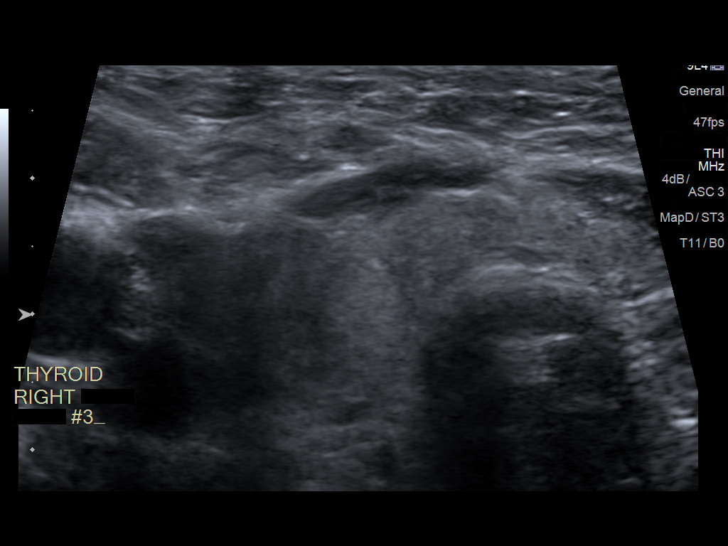
[im 9/13]
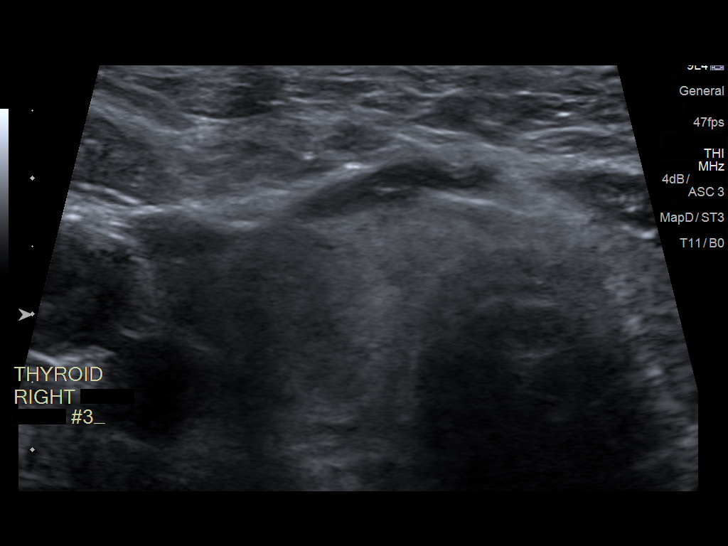
[im 10/13]
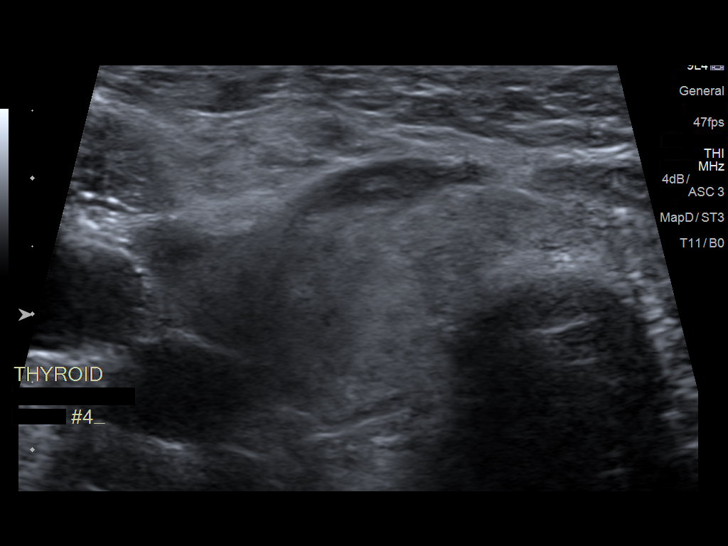
[im 11/13]
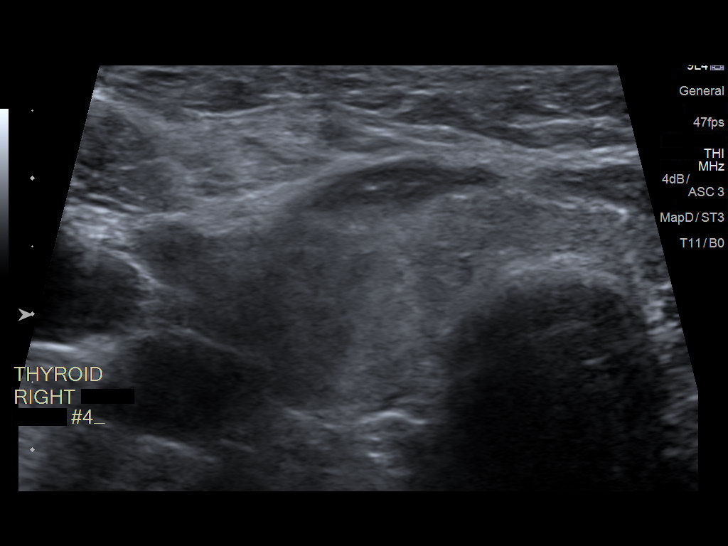
[im 12/13]
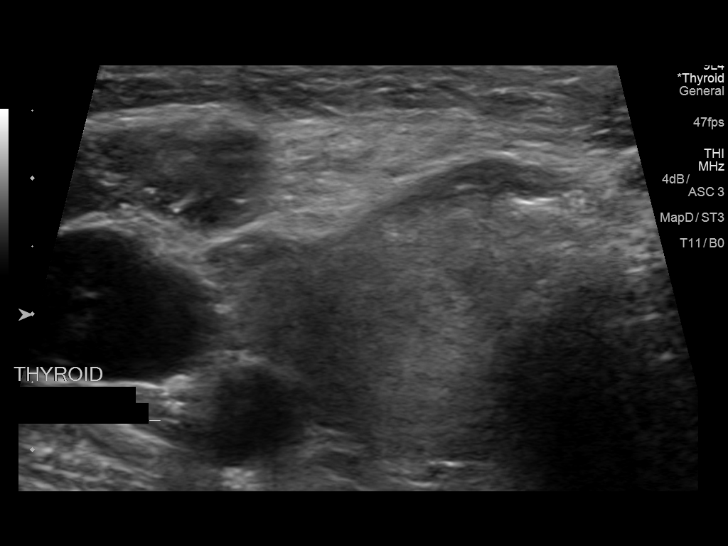
[im 13/13]
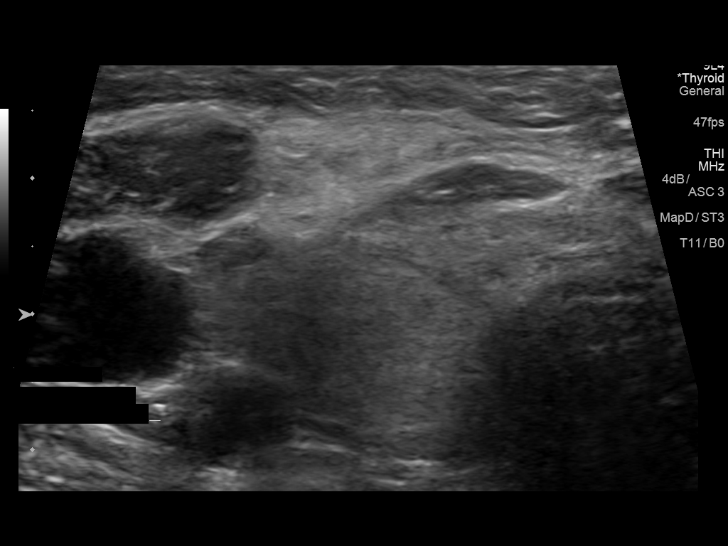

[13 of 13 positions shown; findings below may reference images not displayed]

Pre-procedural ultrasound scanning demonstrated unchanged size and
appearance of the indeterminate nodule within the right mid lobe
thyroid

The procedure was planned. The neck was prepped in the usual sterile
fashion, and a sterile drape was applied covering the operative
field. A timeout was performed prior to the initiation of the
procedure. Local anesthesia was provided with 1% lidocaine.

Under direct ultrasound guidance, 4 FNA biopsies were performed of
the right mid lobe thyroid nodule with a 25 gauge needle. Multiple
ultrasound images were saved for procedural documentation purposes.
The samples were prepared and submitted to pathology.

Limited post procedural scanning was negative for hematoma or
additional complication. Dressings were placed. The patient
tolerated the above procedures procedure well without immediate
postprocedural complication.
FINDINGS: Nodule reference number based on prior diagnostic ultrasound: 1

Maximum size:  1.5 cm

Location: Right; Mid

ACR TI-RADS risk category: TR4 (4-6 points)

Reason for biopsy: meets ACR TI-RADS criteria

Ultrasound imaging confirms appropriate placement of the needles
within the thyroid nodule.
IMPRESSION: Technically successful ultrasound guided fine needle aspiration of
right mid lobe thyroid nodule

Read by

Shaniqua Matz

## 2017-10-26 ENCOUNTER — Other Ambulatory Visit: Payer: Self-pay

## 2017-10-26 DIAGNOSIS — L719 Rosacea, unspecified: Secondary | ICD-10-CM

## 2017-10-26 MED ORDER — ROSUVASTATIN CALCIUM 20 MG PO TABS: 20.0000 mg | ORAL_TABLET | Freq: Every day | ORAL | 0 refills | Status: DC

## 2017-10-26 MED ORDER — METRONIDAZOLE 0.75 % EX CREA
TOPICAL_CREAM | Freq: Two times a day (BID) | CUTANEOUS | 0 refills | Status: DC
Start: 2017-10-26 — End: 2023-03-29

## 2017-10-26 MED ORDER — LEVOTHYROXINE SODIUM 25 MCG PO TABS: ORAL_TABLET | ORAL | 0 refills | Status: DC

## 2017-11-08 ENCOUNTER — Ambulatory Visit: Payer: Medicare PPO | Admitting: Family Medicine

## 2017-11-10 ENCOUNTER — Other Ambulatory Visit: Payer: Self-pay | Admitting: Family Medicine

## 2017-11-10 DIAGNOSIS — I10 Essential (primary) hypertension: Secondary | ICD-10-CM

## 2017-11-18 ENCOUNTER — Other Ambulatory Visit: Payer: Self-pay | Admitting: Family Medicine

## 2017-11-18 DIAGNOSIS — L719 Rosacea, unspecified: Secondary | ICD-10-CM

## 2018-01-09 ENCOUNTER — Other Ambulatory Visit: Payer: Self-pay | Admitting: Family Medicine

## 2018-01-16 ENCOUNTER — Other Ambulatory Visit: Payer: Self-pay | Admitting: Family Medicine

## 2018-01-16 DIAGNOSIS — I10 Essential (primary) hypertension: Secondary | ICD-10-CM

## 2018-01-25 ENCOUNTER — Other Ambulatory Visit: Payer: Self-pay | Admitting: Family Medicine

## 2018-02-14 DIAGNOSIS — M7542 Impingement syndrome of left shoulder: Secondary | ICD-10-CM | POA: Diagnosis not present

## 2018-02-14 DIAGNOSIS — R21 Rash and other nonspecific skin eruption: Secondary | ICD-10-CM | POA: Diagnosis not present

## 2018-02-14 DIAGNOSIS — I10 Essential (primary) hypertension: Secondary | ICD-10-CM | POA: Diagnosis not present

## 2018-02-14 DIAGNOSIS — M791 Myalgia, unspecified site: Secondary | ICD-10-CM | POA: Diagnosis not present

## 2018-02-14 DIAGNOSIS — Z79899 Other long term (current) drug therapy: Secondary | ICD-10-CM | POA: Diagnosis not present

## 2018-02-14 DIAGNOSIS — M5124 Other intervertebral disc displacement, thoracic region: Secondary | ICD-10-CM | POA: Diagnosis not present

## 2018-02-14 DIAGNOSIS — F1120 Opioid dependence, uncomplicated: Secondary | ICD-10-CM | POA: Diagnosis not present

## 2018-02-14 DIAGNOSIS — Z6825 Body mass index (BMI) 25.0-25.9, adult: Secondary | ICD-10-CM | POA: Diagnosis not present

## 2018-02-14 DIAGNOSIS — M546 Pain in thoracic spine: Secondary | ICD-10-CM | POA: Diagnosis not present

## 2018-02-15 ENCOUNTER — Encounter: Payer: Self-pay | Admitting: Family Medicine

## 2018-02-15 ENCOUNTER — Ambulatory Visit (INDEPENDENT_AMBULATORY_CARE_PROVIDER_SITE_OTHER): Payer: Medicare HMO | Admitting: Family Medicine

## 2018-02-15 ENCOUNTER — Other Ambulatory Visit: Payer: Self-pay

## 2018-02-15 VITALS — BP 158/78 | HR 109 | Temp 97.6°F | Ht 64.0 in | Wt 157.8 lb

## 2018-02-15 DIAGNOSIS — R002 Palpitations: Secondary | ICD-10-CM

## 2018-02-15 DIAGNOSIS — E039 Hypothyroidism, unspecified: Secondary | ICD-10-CM | POA: Diagnosis not present

## 2018-02-15 DIAGNOSIS — E782 Mixed hyperlipidemia: Secondary | ICD-10-CM

## 2018-02-15 DIAGNOSIS — I10 Essential (primary) hypertension: Secondary | ICD-10-CM

## 2018-02-15 LAB — BASIC METABOLIC PANEL
BUN: 21 mg/dL (ref 6–23)
CO2: 22 mEq/L (ref 19–32)
Calcium: 9.8 mg/dL (ref 8.4–10.5)
Chloride: 105 mEq/L (ref 96–112)
Creatinine, Ser: 1.01 mg/dL (ref 0.40–1.20)
GFR: 53.3 mL/min — ABNORMAL LOW (ref >60.00)
GLUCOSE: 118 mg/dL — AB (ref 70–99)
Potassium: 4.3 mEq/L (ref 3.5–5.1)
Sodium: 136 mEq/L (ref 135–145)

## 2018-02-15 LAB — LIPID PANEL
Cholesterol: 384 mg/dL — ABNORMAL HIGH (ref 0–200)
HDL: 49.4 mg/dL (ref >39.00)
LDL Cholesterol: 303 mg/dL — ABNORMAL HIGH (ref 0–99)
NONHDL: 334.17
Total CHOL/HDL Ratio: 8
Triglycerides: 158 mg/dL — ABNORMAL HIGH (ref 0.0–149.0)
VLDL: 31.6 mg/dL (ref 0.0–40.0)

## 2018-02-15 LAB — HEPATIC FUNCTION PANEL
ALT: 27 U/L (ref 0–35)
AST: 21 U/L (ref 0–37)
Albumin: 4.3 g/dL (ref 3.5–5.2)
Alkaline Phosphatase: 78 U/L (ref 39–117)
Bilirubin, Direct: 0.1 mg/dL (ref 0.0–0.3)
Total Bilirubin: 0.4 mg/dL (ref 0.2–1.2)
Total Protein: 7.1 g/dL (ref 6.0–8.3)

## 2018-02-15 LAB — TSH: TSH: 2.37 u[IU]/mL (ref 0.35–4.50)

## 2018-02-15 NOTE — Progress Notes (Signed)
Subjective:     Patient ID: Madison Hernandez, female   DOB: 08/02/1940, 78 y.o.   MRN: [id number]  HPI Patient is seen for medical follow-up.  She has history of hypertension, hypothyroidism, hyperlipidemia.  She states she has had some recent palpitations which she has had in the past.  She does drink a fair amount of caffeine in terms of sodas and tea.  Occasional alcohol.  Denies any associated dizziness or syncope.  No dyspnea.  Medications reviewed.  She remains on amlodipine 5 mg daily and benazepril 20 mg daily for hypertension.  Compliant with medications.  She takes Crestor for hyperlipidemia.  She has hypothyroidism and is on replacement with low-dose levothyroxine 25 mcg.  She has history of allergy to sulfa.  Past Medical History:  Diagnosis Date  . Acute ischemic colitis ([place]) 1/12  . Arthritis   . Bleeding ulcer    22 years ago  . Blood in stool   . Cardiac arrhythmia due to congenital heart disease   . Depression   . Diverticulitis   . Dyslipidemia   . Headache(784.0)   . Hyperlipidemia   . Hypertension   . Migraine   . UTI (urinary tract infection)    Past Surgical History:  Procedure Laterality Date  . bleeding ulcers     22 years ago  . vein procedures     right and left lower extremity vein procedures    reports that she quit smoking about 35 years ago. Her smoking use included cigarettes. She has a 20.00 pack-year smoking history. She has never used smokeless tobacco. She reports that she does not drink alcohol or use drugs. family history includes Diabetes in her mother; Heart disease in her mother; Hypertension in her mother and sister. Allergies  Allergen Reactions  . Avelox [Moxifloxacin Hcl In Nacl] Shortness Of Breath  . Mucinex [Guaifenesin Er] Other (See Comments)    "Makes my heart flutter"  . Sulfa Antibiotics Hives     Review of Systems  Constitutional: Negative for fatigue.  Eyes: Negative for visual disturbance.  Respiratory: Negative for  cough, chest tightness, shortness of breath and wheezing.   Cardiovascular: Negative for chest pain, palpitations and leg swelling.  Endocrine: Negative for polydipsia and polyuria.  Genitourinary: Negative for dysuria.  Neurological: Negative for dizziness, seizures, syncope, weakness, light-headedness and headaches.       Objective:   Physical Exam Constitutional:      Appearance: She is well-developed.  Eyes:     Pupils: Pupils are equal, round, and reactive to light.  Neck:     Musculoskeletal: Neck supple.     Thyroid: No thyromegaly.     Vascular: No JVD.  Cardiovascular:     Rate and Rhythm: Normal rate and regular rhythm.     Heart sounds: No gallop.   Pulmonary:     Effort: Pulmonary effort is normal. No respiratory distress.     Breath sounds: Normal breath sounds. No wheezing or rales.  Musculoskeletal:     Right lower leg: No edema.     Left lower leg: No edema.  Neurological:     Mental Status: She is alert.        Assessment:     #1 hypertension suboptimally controlled.  Repeat right arm at rest 158/78  #2 hyperlipidemia  #3 hypothyroidism    Plan:     -We considered addition of thiazide diuretic but she has sulfa allergy.  We decided to increase her amlodipine to 10 mg  daily and watch for peripheral edema.  We will bring back to reassess blood pressure about 3 weeks.  -Check labs today with lipids, hepatic, basic metabolic panel, TSH  Eulas Post MD  Primary Care at [facility]

## 2018-02-15 NOTE — Patient Instructions (Signed)
Increase the Amlodipine 5 mg to TWO daily.  Let's plan on 3 week follow up.

## 2018-02-17 ENCOUNTER — Other Ambulatory Visit: Payer: Self-pay

## 2018-02-17 ENCOUNTER — Telehealth: Payer: Self-pay | Admitting: Family Medicine

## 2018-02-17 DIAGNOSIS — I10 Essential (primary) hypertension: Secondary | ICD-10-CM

## 2018-02-17 MED ORDER — AMLODIPINE BESYLATE 5 MG PO TABS: 10.0000 mg | ORAL_TABLET | Freq: Every day | ORAL | 0 refills | Status: DC

## 2018-02-17 NOTE — Telephone Encounter (Signed)
This medication has been sent to the [facility] and was increased to 2 daily of the 5 mg per last OV note.  Called patient and let her know that this has been sent in. Patient verbalized an understanding.

## 2018-02-17 NOTE — Telephone Encounter (Signed)
Copied from [place] [id number]. Topic: Quick Communication - Rx Refill/Question >> Feb 17, 2018  9:23 AM Andria Frames L wrote: Medication: amLODipine (NORVASC) 5 MG tablet [[id number]]   Has the patient contacted their pharmacy? no  Preferred Pharmacy (with phone number or street name):[facility] - [place], [place] - [address] [phone] (Phone) [phone] (Fax)  Agent: Please be advised that RX refills may take up to 3 business days. We ask that you follow-up with your pharmacy.

## 2018-02-20 ENCOUNTER — Other Ambulatory Visit: Payer: Self-pay | Admitting: Family Medicine

## 2018-02-22 ENCOUNTER — Other Ambulatory Visit: Payer: Self-pay | Admitting: *Deleted

## 2018-02-22 MED ORDER — EZETIMIBE 10 MG PO TABS: 10.0000 mg | ORAL_TABLET | Freq: Every day | ORAL | 1 refills | Status: DC

## 2018-02-24 ENCOUNTER — Telehealth: Payer: Self-pay | Admitting: *Deleted

## 2018-02-24 NOTE — Telephone Encounter (Signed)
Prior auth for Amlodipine 5mg  (pt takes 2 once a day) was sent to [email or web address]-key [id number].

## 2018-02-28 NOTE — Telephone Encounter (Signed)
Let her know that her insurance will not cover to two 5 mg Amlodipine tablets (they will cover to one 10 mg).  If she feels taking medication twice daily is somehow better tolerated for her, she could get the 10 mg and half it and take one half bid.

## 2018-02-28 NOTE — Telephone Encounter (Signed)
Please advise. Denial paperwork in green folder.

## 2018-02-28 NOTE — Telephone Encounter (Signed)
Fax received from [facility] stating the request was denied and this was given to Dr Burchette's asst.

## 2018-03-01 ENCOUNTER — Other Ambulatory Visit: Payer: Self-pay

## 2018-03-01 MED ORDER — AMLODIPINE BESYLATE 10 MG PO TABS: 5.0000 mg | ORAL_TABLET | Freq: Two times a day (BID) | ORAL | 3 refills | Status: DC

## 2018-03-01 NOTE — Telephone Encounter (Signed)
Called patient and gave her the message from Dr. Elease Hashimoto. Patient verbalized an understanding and I let her know that I have sent the Amlodipine 10 mg to [place] for 90 days for her.

## 2018-03-02 ENCOUNTER — Encounter: Payer: Self-pay | Admitting: Family Medicine

## 2018-03-02 DIAGNOSIS — H25013 Cortical age-related cataract, bilateral: Secondary | ICD-10-CM | POA: Diagnosis not present

## 2018-03-02 DIAGNOSIS — H2513 Age-related nuclear cataract, bilateral: Secondary | ICD-10-CM | POA: Diagnosis not present

## 2018-03-02 DIAGNOSIS — H01009 Unspecified blepharitis unspecified eye, unspecified eyelid: Secondary | ICD-10-CM | POA: Diagnosis not present

## 2018-03-02 DIAGNOSIS — H35033 Hypertensive retinopathy, bilateral: Secondary | ICD-10-CM | POA: Diagnosis not present

## 2018-03-08 ENCOUNTER — Ambulatory Visit (INDEPENDENT_AMBULATORY_CARE_PROVIDER_SITE_OTHER): Payer: Medicare HMO | Admitting: Family Medicine

## 2018-03-08 ENCOUNTER — Other Ambulatory Visit: Payer: Self-pay

## 2018-03-08 ENCOUNTER — Encounter: Payer: Self-pay | Admitting: Family Medicine

## 2018-03-08 VITALS — BP 122/80 | HR 72 | Temp 98.0°F | Ht 64.0 in | Wt 154.2 lb

## 2018-03-08 DIAGNOSIS — R739 Hyperglycemia, unspecified: Secondary | ICD-10-CM

## 2018-03-08 DIAGNOSIS — I10 Essential (primary) hypertension: Secondary | ICD-10-CM

## 2018-03-08 DIAGNOSIS — E782 Mixed hyperlipidemia: Secondary | ICD-10-CM

## 2018-03-08 NOTE — Progress Notes (Signed)
Subjective:     Patient ID: Madison Hernandez, female   DOB: [birth year], 78 y.o.   MRN: [id number]  HPI  Patient was here recently with elevated blood pressure.  We increased her amlodipine to 10 mg daily.  She has also tightened up her diet and has eliminated sodas and has already lost about 4 pounds.  She feels much better overall.  No dizziness.  No headaches.  No increased peripheral edema.  Blood pressure much improved today  Severe hyperlipidemia.  She had cholesterol 384 with LDL cholesterol 303.  She was supposed to be on Crestor but apparently was not taking.  She is now back on Crestor daily and denies any side effects.  She had other labs done including thyroid functions which came back stable.  Elevated glucose 118.  Past history of prediabetes.  No polyuria or polydipsia.  She has scaled back sugar since those labs were done  Past Medical History:  Diagnosis Date  . Acute ischemic colitis ([place]) 1/12  . Arthritis   . Bleeding ulcer    22 years ago  . Blood in stool   . Cardiac arrhythmia due to congenital heart disease   . Depression   . Diverticulitis   . Dyslipidemia   . Headache(784.0)   . Hyperlipidemia   . Hypertension   . Migraine   . UTI (urinary tract infection)    Past Surgical History:  Procedure Laterality Date  . bleeding ulcers     22 years ago  . vein procedures     right and left lower extremity vein procedures    reports that she quit smoking about 35 years ago. Her smoking use included cigarettes. She has a 20.00 pack-year smoking history. She has never used smokeless tobacco. She reports that she does not drink alcohol or use drugs. family history includes Diabetes in her mother; Heart disease in her mother; Hypertension in her mother and sister. Allergies  Allergen Reactions  . Avelox [Moxifloxacin Hcl In Nacl] Shortness Of Breath  . Mucinex [Guaifenesin Er] Other (See Comments)    "Makes my heart flutter"  . Sulfa Antibiotics Hives      Review of Systems  Constitutional: Negative for appetite change, chills, fatigue, fever and unexpected weight change.  HENT: Negative for trouble swallowing.   Eyes: Negative for visual disturbance.  Respiratory: Negative for cough, chest tightness, shortness of breath and wheezing.   Cardiovascular: Negative for chest pain, palpitations and leg swelling.  Gastrointestinal: Negative for abdominal pain.  Endocrine: Negative for polydipsia and polyuria.  Genitourinary: Negative for dysuria.  Neurological: Negative for dizziness, seizures, syncope, weakness, light-headedness and headaches.  Psychiatric/Behavioral: Negative for confusion.       Objective:   Physical Exam Constitutional:      Appearance: She is well-developed.  Eyes:     Pupils: Pupils are equal, round, and reactive to light.  Neck:     Musculoskeletal: Neck supple.     Thyroid: No thyromegaly.     Vascular: No JVD.  Cardiovascular:     Rate and Rhythm: Normal rate and regular rhythm.     Heart sounds: No gallop.   Pulmonary:     Effort: Pulmonary effort is normal. No respiratory distress.     Breath sounds: Normal breath sounds. No wheezing or rales.  Neurological:     Mental Status: She is alert.        Assessment:     #1 hypertension greatly improved with recent increased dose of amlodipine  #  2 severe dyslipidemia.  Patient had not been taking her Crestor but is now reportedly back on this  #3 prediabetes range blood sugar    Plan:     -Continue low glycemic diet and weight loss efforts -Continue current medications -Ordered future lab with lipids and hepatic panel in about 2 months.  We will plan on office follow-up in about 6 months.  Eulas Post MD [facility]

## 2018-03-29 NOTE — Telephone Encounter (Signed)
Fax received from [facility] stating the request for Amlodipine 10mg  is available without a prior authorization and this was sent to be scanned.

## 2018-04-06 DIAGNOSIS — H04123 Dry eye syndrome of bilateral lacrimal glands: Secondary | ICD-10-CM | POA: Diagnosis not present

## 2018-04-06 DIAGNOSIS — H01009 Unspecified blepharitis unspecified eye, unspecified eyelid: Secondary | ICD-10-CM | POA: Diagnosis not present

## 2018-04-10 ENCOUNTER — Other Ambulatory Visit: Payer: Self-pay

## 2018-04-10 ENCOUNTER — Telehealth: Payer: Self-pay | Admitting: Family Medicine

## 2018-04-10 MED ORDER — EZETIMIBE 10 MG PO TABS: 10.0000 mg | ORAL_TABLET | Freq: Every day | ORAL | 1 refills | Status: DC

## 2018-04-10 MED ORDER — ROSUVASTATIN CALCIUM 10 MG PO TABS: 10.0000 mg | ORAL_TABLET | Freq: Every day | ORAL | 1 refills | Status: DC

## 2018-04-10 NOTE — Telephone Encounter (Signed)
Called patient and she wants these to be sent to [facility]. Discussed medications with Dr. Elease Hashimoto and he verbally agreed to send in the Crestor brand name 10 mg for the patient and stated that she could take this with Zetia 10 mg. Patient confirmed an understanding.

## 2018-04-10 NOTE — Telephone Encounter (Signed)
Copied from [place] [id number]. Topic: Quick Communication - Rx Refill/Question >> Apr 10, 2018  9:33 AM Burchel, Abbi R wrote: Medication: CRESTOR 10MG  and ezetimibe (ZETIA) 10 MG tablet  Pt states she only wants name brand Crestor not generic.    Preferred Pharmacy: [place], [place] [address] Phone: [phone] Fax: [phone]    Pt was advised that RX refills may take up to 3 business days. We ask that you follow-up with your pharmacy.

## 2018-04-14 ENCOUNTER — Telehealth: Payer: Self-pay | Admitting: Family Medicine

## 2018-04-14 NOTE — Telephone Encounter (Signed)
Patient called and said that her pharmacy called her and said that the medication Crestor is $800, she wants to know can she have a another prescription written for a different medication?

## 2018-04-17 NOTE — Telephone Encounter (Signed)
Patient requested name brand Crestor only, she did not want the generic.

## 2018-04-17 NOTE — Telephone Encounter (Signed)
Please see message.  Please advise. 

## 2018-04-17 NOTE — Telephone Encounter (Signed)
Called patient and she stated that she took the generic for 8 months and it did not change her lab results and she gets better results with the Crestor. Patient stated that she will be changing companies and she will be using [facility] after April 1st. Patient wants to wait to see what her cost will be and will reach out to [place] after April 1st.

## 2018-04-17 NOTE — Telephone Encounter (Signed)
We can switch to another statin, but if she insists on name brand only she will pay a huge amount for any of them.  I'm OK to try Lipitor 20 mg daily.  I highly advice generic- unless she has clearly had a problem previously with generic.

## 2018-04-17 NOTE — Telephone Encounter (Signed)
There is no way the generic (Rosuvastatin) would cost that much.  Make sure they are giving her generic first.

## 2018-04-24 ENCOUNTER — Ambulatory Visit: Payer: Medicare HMO | Admitting: Family Medicine

## 2018-04-26 ENCOUNTER — Other Ambulatory Visit: Payer: Medicare HMO

## 2018-05-12 ENCOUNTER — Telehealth: Payer: Self-pay | Admitting: Family Medicine

## 2018-05-12 DIAGNOSIS — E782 Mixed hyperlipidemia: Secondary | ICD-10-CM

## 2018-05-12 MED ORDER — EZETIMIBE 10 MG PO TABS: 10.0000 mg | ORAL_TABLET | Freq: Every day | ORAL | 1 refills | Status: DC

## 2018-05-12 MED ORDER — ROSUVASTATIN CALCIUM 10 MG PO TABS: 10.0000 mg | ORAL_TABLET | Freq: Every day | ORAL | 1 refills | Status: DC

## 2018-05-12 NOTE — Telephone Encounter (Signed)
Rx refill sent to [facility]. 

## 2018-05-12 NOTE — Telephone Encounter (Signed)
Copied from [place] [id number]. Topic: Quick Communication - Rx Refill/Question >> May 12, 2018  1:54 PM Madison Hernandez, [place] A wrote: Medication: ezetimibe (ZETIA) 10 MG tablet ,rosuvastatin (CRESTOR) 10 MG tablet  Has the patient contacted their pharmacy? Yes (Agent: If no, request that the patient contact the pharmacy for the refill.) (Agent: If yes, when and what did the pharmacy advise?)Contact PCP  Preferred Pharmacy (with phone number or street name): [place], [place] [phone] (Phone) [phone] (Fax)    Agent: Please be advised that RX refills may take up to 3 business days. We ask that you follow-up with your pharmacy.

## 2018-05-17 ENCOUNTER — Other Ambulatory Visit: Payer: Self-pay

## 2018-05-17 ENCOUNTER — Telehealth: Payer: Self-pay | Admitting: *Deleted

## 2018-05-17 MED ORDER — BENAZEPRIL HCL 20 MG PO TABS: ORAL_TABLET | ORAL | 1 refills | Status: DC

## 2018-05-17 NOTE — Telephone Encounter (Signed)
FYI: see most recent message.

## 2018-05-17 NOTE — Telephone Encounter (Signed)
°  Relation to pt: self  Call back [place]: [place], [place] [phone] (Phone) [phone] (Fax)    Reason for call:  Patient wanted PCP to be aware she will d/c the crestor she has not seen an improvement and will try the zetia "only" when she receives from the mail order.

## 2018-05-17 NOTE — Telephone Encounter (Signed)
Medication has been sent to [facility] pharmacy for patient.

## 2018-05-17 NOTE — Telephone Encounter (Signed)
Copied from [place] [phone]. Topic: Quick Communication - Rx Refill/Question >> May 17, 2018  3:45 PM Oneta Rack wrote: Medication: benazepril (LOTENSIN) 20 MG tablet   Preferred Pharmacy (with phone number or street name):   [place], [place] [phone] (Phone) [phone] (Fax)    Agent: Please be advised that RX refills may take up to 3 business days. We ask that you follow-up with your pharmacy.

## 2018-05-22 NOTE — Telephone Encounter (Signed)
Pt requesting a call back to discuss med changes.    [phone]

## 2018-05-23 NOTE — Telephone Encounter (Signed)
Called patient and NOT able to LMOVM to return call  [place] for [facility] to Discuss results / PCP / recommendations / Schedule patient  I was returning the patients call and her voice message would not allow me to leave her a voice message. I will try again at another time.  CRM Created in case she sees the number and calls back.

## 2018-05-23 NOTE — Telephone Encounter (Signed)
There is only one dose of Zetia- 10 mg.  Consider fasting lipids in about 2 months on the Zetia.

## 2018-05-23 NOTE — Telephone Encounter (Signed)
Pt. Reports she has stopped the Crestor. Wants to be sure she is to only take Zetia now. Please advise pt.

## 2018-05-23 NOTE — Telephone Encounter (Signed)
Patient was taking the Crestor 10 mg and the Zetia 10 mg together and I just spoke with her and she stopped taking the Crestor 10 mg about 4 days ago because she was hurting so bad that she had to take pain medication.  Patient is asking if she is to take the Zetia 10 mg or does she need a different dose of the Zetia?  Also when do you want patient to come back for labs?

## 2018-05-24 ENCOUNTER — Other Ambulatory Visit: Payer: Self-pay

## 2018-05-24 NOTE — Telephone Encounter (Signed)
Called patient and gave her the message and patient has a lab appointment on 07/19/18 at 8 am fasting.

## 2018-06-06 ENCOUNTER — Telehealth: Payer: Self-pay | Admitting: Family Medicine

## 2018-06-06 NOTE — Telephone Encounter (Signed)
Please advise if OK to fill Protonix 20 mg? This does not seem to have been filled by you?

## 2018-06-06 NOTE — Telephone Encounter (Signed)
Copied from [place] [id number]. Topic: Quick Communication - Rx Refill/Question >> Jun 06, 2018 11:47 AM Sheran Luz wrote: Medication: amLODipine (NORVASC) 10 MG tablet and pantoprazole (PROTONIX) 20 MG tablet   Patient is requesting refills.   Preferred Pharmacy (with phone number or street name):[facility], [place] [employer] [phone] (Phone) [phone] (Fax)

## 2018-06-06 NOTE — Telephone Encounter (Signed)
Refill for 6 months. 

## 2018-06-07 ENCOUNTER — Other Ambulatory Visit: Payer: Self-pay

## 2018-06-07 DIAGNOSIS — K219 Gastro-esophageal reflux disease without esophagitis: Secondary | ICD-10-CM

## 2018-06-07 MED ORDER — AMLODIPINE BESYLATE 10 MG PO TABS: 5.0000 mg | ORAL_TABLET | Freq: Two times a day (BID) | ORAL | 1 refills | Status: DC

## 2018-06-07 MED ORDER — PANTOPRAZOLE SODIUM 20 MG PO TBEC: 20.0000 mg | DELAYED_RELEASE_TABLET | Freq: Every day | ORAL | 1 refills | Status: DC

## 2018-06-07 NOTE — Telephone Encounter (Signed)
Both of these medications have been filled for this patient and sent to [facility].

## 2018-06-28 ENCOUNTER — Telehealth: Payer: Self-pay | Admitting: Family Medicine

## 2018-06-28 NOTE — Telephone Encounter (Signed)
Copied from [place] [phone]. Topic: Quick Communication - Rx Refill/Question >> Jun 28, 2018  4:19 PM Keene Breath wrote: Medication: linaclotide (LINZESS) 145 MCG CAPS capsule  Patient called to request a refill for the above medication  Preferred Pharmacy (with phone number or street name): [place], [place] [employer] [phone] (Phone) [phone] (Fax)

## 2018-06-30 MED ORDER — LINACLOTIDE 145 MCG PO CAPS: 145.0000 ug | ORAL_CAPSULE | Freq: Every day | ORAL | 2 refills | Status: DC

## 2018-06-30 NOTE — Telephone Encounter (Signed)
Please advise. This medication was filled by a historical provider.  Ok to refill?

## 2018-06-30 NOTE — Telephone Encounter (Signed)
Rx has been sent in. Nothing further needed. 

## 2018-06-30 NOTE — Telephone Encounter (Signed)
Refill OK

## 2018-07-10 ENCOUNTER — Telehealth: Payer: Self-pay | Admitting: Family Medicine

## 2018-07-10 NOTE — Telephone Encounter (Signed)
Medication Refill - Medication: linaclotide (LINZESS) 145 MCG CAPS capsule    Has the patient contacted their pharmacy? Yes.  Pt would like this sent to new pharmacy. Please advise.  (Agent: If no, request that the patient contact the pharmacy for the refill.) (Agent: If yes, when and what did the pharmacy advise?)  Preferred Pharmacy (with phone number or street name):  [place], [place] [facility]  [address] [place] [address]  Phone: [phone] Fax: [phone]  Not a 24 hour pharmacy; exact hours not known.     Agent: Please be advised that RX refills may take up to 3 business days. We ask that you follow-up with your pharmacy.

## 2018-07-11 ENCOUNTER — Other Ambulatory Visit: Payer: Self-pay

## 2018-07-11 MED ORDER — LINACLOTIDE 145 MCG PO CAPS: 145.0000 ug | ORAL_CAPSULE | Freq: Every day | ORAL | 0 refills | Status: DC

## 2018-07-11 NOTE — Telephone Encounter (Signed)
Medication has been sent to [facility] for a 90 day supply.

## 2018-07-19 ENCOUNTER — Other Ambulatory Visit: Payer: Self-pay

## 2018-07-19 ENCOUNTER — Other Ambulatory Visit (INDEPENDENT_AMBULATORY_CARE_PROVIDER_SITE_OTHER): Payer: Medicare Other

## 2018-07-19 DIAGNOSIS — E782 Mixed hyperlipidemia: Secondary | ICD-10-CM | POA: Diagnosis not present

## 2018-07-19 LAB — HEPATIC FUNCTION PANEL
ALT: 17 U/L (ref 0–35)
AST: 18 U/L (ref 0–37)
Albumin: 4.5 g/dL (ref 3.5–5.2)
Alkaline Phosphatase: 71 U/L (ref 39–117)
Bilirubin, Direct: 0 mg/dL (ref 0.0–0.3)
Total Bilirubin: 0.4 mg/dL (ref 0.2–1.2)
Total Protein: 7.3 g/dL (ref 6.0–8.3)

## 2018-07-19 LAB — BASIC METABOLIC PANEL
BUN: 21 mg/dL (ref 6–23)
CO2: 26 mEq/L (ref 19–32)
Calcium: 9.7 mg/dL (ref 8.4–10.5)
Chloride: 103 mEq/L (ref 96–112)
Creatinine, Ser: 1.18 mg/dL (ref 0.40–1.20)
GFR: 44.49 mL/min — ABNORMAL LOW (ref >60.00)
Glucose, Bld: 104 mg/dL — ABNORMAL HIGH (ref 70–99)
Potassium: 5.3 mEq/L — ABNORMAL HIGH (ref 3.5–5.1)
Sodium: 138 mEq/L (ref 135–145)

## 2018-07-19 LAB — LIPID PANEL
Cholesterol: 328 mg/dL — ABNORMAL HIGH (ref 0–200)
HDL: 61 mg/dL (ref >39.00)
LDL Cholesterol: 232 mg/dL — ABNORMAL HIGH (ref 0–99)
NonHDL: 267.17
Total CHOL/HDL Ratio: 5
Triglycerides: 178 mg/dL — ABNORMAL HIGH (ref 0.0–149.0)
VLDL: 35.6 mg/dL (ref 0.0–40.0)

## 2018-07-25 ENCOUNTER — Telehealth: Payer: Self-pay | Admitting: Family Medicine

## 2018-07-25 NOTE — Telephone Encounter (Signed)
Patient is wanting a call back for her lab results.  Her phone is messed up so she said to wait and call her back this afternoon.

## 2018-07-27 ENCOUNTER — Telehealth: Payer: Self-pay | Admitting: Family Medicine

## 2018-07-27 NOTE — Telephone Encounter (Signed)
Pt has new insurance and needs her levothyroxine (SYNTHROID, LEVOTHROID) 25 MCG tablet 90 day Sent to  [place], [place] [phone] (Phone) [phone] (Fax)

## 2018-07-28 ENCOUNTER — Other Ambulatory Visit: Payer: Self-pay

## 2018-07-28 MED ORDER — LEVOTHYROXINE SODIUM 25 MCG PO TABS: ORAL_TABLET | ORAL | 2 refills | Status: DC

## 2018-07-28 NOTE — Telephone Encounter (Signed)
Medication has been sent to the [facility].

## 2018-07-31 ENCOUNTER — Ambulatory Visit (INDEPENDENT_AMBULATORY_CARE_PROVIDER_SITE_OTHER): Payer: Medicare Other | Admitting: Family Medicine

## 2018-07-31 ENCOUNTER — Other Ambulatory Visit: Payer: Self-pay

## 2018-07-31 DIAGNOSIS — E782 Mixed hyperlipidemia: Secondary | ICD-10-CM | POA: Diagnosis not present

## 2018-07-31 DIAGNOSIS — I10 Essential (primary) hypertension: Secondary | ICD-10-CM | POA: Diagnosis not present

## 2018-07-31 DIAGNOSIS — E039 Hypothyroidism, unspecified: Secondary | ICD-10-CM | POA: Diagnosis not present

## 2018-07-31 DIAGNOSIS — N189 Chronic kidney disease, unspecified: Secondary | ICD-10-CM | POA: Diagnosis not present

## 2018-07-31 MED ORDER — ROSUVASTATIN CALCIUM 20 MG PO TABS: 20.0000 mg | ORAL_TABLET | Freq: Every day | ORAL | 3 refills | Status: DC

## 2018-07-31 NOTE — Progress Notes (Signed)
Patient ID: Madison Hernandez, female   DOB: 1940-11-29, 78 y.o.   MRN: [id number]  This visit type was conducted due to national recommendations for restrictions regarding the COVID-19 pandemic in an effort to limit this patient's exposure and mitigate transmission in our community.   Virtual Visit via Telephone Note  I connected with Madison Hernandez on 07/31/18 at 11:00 AM EDT by telephone and verified that I am speaking with the correct person using two identifiers.   I discussed the limitations, risks, security and privacy concerns of performing an evaluation and management service by telephone and the availability of in person appointments. I also discussed with the patient that there may be a patient responsible charge related to this service. The patient expressed understanding and agreed to proceed.  Location patient: home Location provider: work or home office Participants present for the call: patient, provider Patient did not have a visit in the prior 7 days to address this/these issue(s).   History of Present Illness: Patient has history of hypertension, migraine headaches, PVCs, hypothyroidism, chronic kidney disease, hyperlipidemia.  She had some recent labs and apparently had some phone problems and we were unable to get in touch with her to go over results.  We are calling to discuss those results today.  She has history of severe hyperlipidemia.  Cholesterol was 453 2 years ago.  Most recent lipids were 328 with triglycerides 178, HDL 61, and LDL cholesterol 232.  She is currently on Zetia 10 mg daily.  She was supposed to be on Crestor as well but apparently misunderstood and has not been taking.  She has had some concerns for possible myalgias with statins in the past.  She denies any current statins.  She had recent TSH back in January which was stable.  She has had some occasional chills but no significant weight changes.   Observations/Objective: Patient sounds cheerful and well  on the phone. I do not appreciate any SOB. Speech and thought processing are grossly intact. Patient reported vitals:  Assessment and Plan: #1 hypertension stable -Continue current medications  #2 severe hyperlipidemia -Increase Crestor to 20 mg daily as tolerated and continue Zetia.  Will probably further titrate Crestor at follow-up if tolerating this dosage -Future labs for lipids and hepatic panel in about 2 months  #3 history of hypothyroidism.  Adequately replaced by labs in January of this year  Follow Up Instructions:  -2 months labs as above   99441 5-10 99442 11-20 99443 21-30 I did not refer this patient for an OV in the next 24 hours for this/these issue(s).  I discussed the assessment and treatment plan with the patient. The patient was provided an opportunity to ask questions and all were answered. The patient agreed with the plan and demonstrated an understanding of the instructions.   The patient was advised to call back or seek an in-person evaluation if the symptoms worsen or if the condition fails to improve as anticipated.  I provided 25 minutes of non-face-to-face time during this encounter.   Carolann Littler, MD

## 2018-07-31 NOTE — Telephone Encounter (Signed)
I have not been able to call this patient back to go over labs with her. I see that she has an appointment today to go over labs. Sending as an Pharmacist, hospital.

## 2018-08-29 ENCOUNTER — Other Ambulatory Visit: Payer: Self-pay | Admitting: Family Medicine

## 2018-09-06 ENCOUNTER — Ambulatory Visit (INDEPENDENT_AMBULATORY_CARE_PROVIDER_SITE_OTHER): Payer: Medicare Other | Admitting: Family Medicine

## 2018-09-06 ENCOUNTER — Other Ambulatory Visit: Payer: Self-pay

## 2018-09-06 ENCOUNTER — Encounter: Payer: Self-pay | Admitting: Family Medicine

## 2018-09-06 VITALS — BP 138/56 | HR 85 | Temp 98.0°F | Ht 64.0 in | Wt 154.3 lb

## 2018-09-06 DIAGNOSIS — E875 Hyperkalemia: Secondary | ICD-10-CM | POA: Diagnosis not present

## 2018-09-06 DIAGNOSIS — N189 Chronic kidney disease, unspecified: Secondary | ICD-10-CM | POA: Diagnosis not present

## 2018-09-06 DIAGNOSIS — R6883 Chills (without fever): Secondary | ICD-10-CM

## 2018-09-06 DIAGNOSIS — E782 Mixed hyperlipidemia: Secondary | ICD-10-CM

## 2018-09-06 DIAGNOSIS — I10 Essential (primary) hypertension: Secondary | ICD-10-CM

## 2018-09-06 DIAGNOSIS — E039 Hypothyroidism, unspecified: Secondary | ICD-10-CM | POA: Diagnosis not present

## 2018-09-06 LAB — LIPID PANEL
Cholesterol: 154 mg/dL (ref 0–200)
HDL: 60 mg/dL (ref >39.00)
LDL Cholesterol: 73 mg/dL (ref 0–99)
NonHDL: 93.75
Total CHOL/HDL Ratio: 3
Triglycerides: 106 mg/dL (ref 0.0–149.0)
VLDL: 21.2 mg/dL (ref 0.0–40.0)

## 2018-09-06 LAB — BASIC METABOLIC PANEL
BUN: 21 mg/dL (ref 6–23)
CO2: 23 mEq/L (ref 19–32)
Calcium: 9.9 mg/dL (ref 8.4–10.5)
Chloride: 107 mEq/L (ref 96–112)
Creatinine, Ser: 1.17 mg/dL (ref 0.40–1.20)
GFR: 44.91 mL/min — ABNORMAL LOW (ref >60.00)
Glucose, Bld: 106 mg/dL — ABNORMAL HIGH (ref 70–99)
Potassium: 4.3 mEq/L (ref 3.5–5.1)
Sodium: 140 mEq/L (ref 135–145)

## 2018-09-06 LAB — TSH: TSH: 2.5 u[IU]/mL (ref 0.35–4.50)

## 2018-09-06 NOTE — Progress Notes (Signed)
Subjective:     Patient ID: Madison Hernandez, female   DOB: 04-27-1940, 78 y.o.   MRN: [id number]  HPI Patient here for follow-up regarding multiple medical problems.  Refer to recent telephone note from July.  Severe hyperlipidemia with cholesterol as high as 453 couple years ago.  She had been recently taking just the Zetia and we added back Crestor 20 mg daily which she is tolerating well and states she is taking daily at this time denies any myalgias.  She does continue to have some chills in the absence of fever.  She has hypothyroidism and is on replacement with normal labs last January.  She has hypertension which is treated with amlodipine and benazepril.  She had recent mild hyperkalemia with potassium 5.3.  Does not take any potassium supplement.  Patient is somewhat vague with regard to family history.  She thinks her mom and dad had some kind of heart issues but cannot give great details.  She denies any recent chest pains.  Past Medical History:  Diagnosis Date  . Acute ischemic colitis ([place]) 1/12  . Arthritis   . Bleeding ulcer    22 years ago  . Blood in stool   . Cardiac arrhythmia due to congenital heart disease   . Depression   . Diverticulitis   . Dyslipidemia   . Headache(784.0)   . Hyperlipidemia   . Hypertension   . Migraine   . UTI (urinary tract infection)    Past Surgical History:  Procedure Laterality Date  . bleeding ulcers     22 years ago  . vein procedures     right and left lower extremity vein procedures    reports that she quit smoking about 35 years ago. Her smoking use included cigarettes. She has a 20.00 pack-year smoking history. She has never used smokeless tobacco. She reports that she does not drink alcohol or use drugs. family history includes Diabetes in her mother; Heart disease in her mother; Hypertension in her mother and sister. Allergies  Allergen Reactions  . Avelox [Moxifloxacin Hcl In Nacl] Shortness Of Breath  . Mucinex  [Guaifenesin Er] Other (See Comments)    "Makes my heart flutter"  . Sulfa Antibiotics Hives     Review of Systems  Constitutional: Negative for fatigue and unexpected weight change.  Eyes: Negative for visual disturbance.  Respiratory: Negative for cough, chest tightness, shortness of breath and wheezing.   Cardiovascular: Negative for chest pain, palpitations and leg swelling.  Endocrine: Negative for polydipsia and polyuria.  Genitourinary: Negative for dysuria.  Neurological: Negative for dizziness, seizures, syncope, weakness, light-headedness and headaches.       Objective:   Physical Exam Constitutional:      Appearance: She is well-developed.  Eyes:     Pupils: Pupils are equal, round, and reactive to light.  Neck:     Musculoskeletal: Neck supple.     Thyroid: No thyromegaly.     Vascular: No JVD.  Cardiovascular:     Rate and Rhythm: Normal rate and regular rhythm.     Heart sounds: No gallop.   Pulmonary:     Effort: Pulmonary effort is normal. No respiratory distress.     Breath sounds: Normal breath sounds. No wheezing or rales.  Musculoskeletal:     Right lower leg: No edema.     Left lower leg: No edema.  Neurological:     Mental Status: She is alert.        Assessment:     #  1 hypertension stable  #2 severe hyperlipidemia.  Recent addition back of Crestor to Zetia  #3 hypothyroidism on low-dose replacement  #4 recent chills without any documented fever  #5 recent mild hyperkalemia from labs.  Not on potassium supplement.  Question hemolyzed    Plan:     -Recheck lipids, TSH, basic metabolic panel today -Consider further titration of Crestor depending on results from labs -Stressed importance of low saturated fat diet -We will plan routine follow-up in 6 months and sooner as needed  Eulas Post MD [facility]

## 2018-09-16 ENCOUNTER — Other Ambulatory Visit: Payer: Self-pay | Admitting: Family Medicine

## 2018-09-16 DIAGNOSIS — E782 Mixed hyperlipidemia: Secondary | ICD-10-CM

## 2018-09-22 ENCOUNTER — Other Ambulatory Visit: Payer: Self-pay

## 2018-09-22 ENCOUNTER — Telehealth: Payer: Self-pay | Admitting: Family Medicine

## 2018-09-22 MED ORDER — AMLODIPINE BESYLATE 10 MG PO TABS: 5.0000 mg | ORAL_TABLET | Freq: Two times a day (BID) | ORAL | 1 refills | Status: DC

## 2018-09-22 NOTE — Telephone Encounter (Signed)
Medication: amLODipine (NORVASC) 10 MG tablet [id number]   Has the patient contacted their pharmacy? Yes  (Agent: If no, request that the patient contact the pharmacy for the refill.) (Agent: If yes, when and what did the pharmacy advise?)  Preferred Pharmacy (with phone number or street name): [place], [place] [phone] (Phone) [phone] (Fax)    Agent: Please be advised that RX refills may take up to 3 business days. We ask that you follow-up with your pharmacy.

## 2018-09-22 NOTE — Telephone Encounter (Signed)
See request °

## 2018-09-22 NOTE — Telephone Encounter (Signed)
Medication has been sent for 90 days to [facility].

## 2018-12-01 ENCOUNTER — Other Ambulatory Visit: Payer: Self-pay | Admitting: Family Medicine

## 2018-12-01 DIAGNOSIS — K219 Gastro-esophageal reflux disease without esophagitis: Secondary | ICD-10-CM

## 2019-01-31 ENCOUNTER — Other Ambulatory Visit: Payer: Medicare Other

## 2019-02-09 ENCOUNTER — Other Ambulatory Visit: Payer: Self-pay | Admitting: Family Medicine

## 2019-03-07 ENCOUNTER — Other Ambulatory Visit: Payer: Self-pay | Admitting: Family Medicine

## 2019-05-08 ENCOUNTER — Other Ambulatory Visit: Payer: Self-pay | Admitting: Family Medicine

## 2019-06-29 ENCOUNTER — Other Ambulatory Visit: Payer: Self-pay | Admitting: Family Medicine

## 2019-07-02 ENCOUNTER — Other Ambulatory Visit: Payer: Self-pay | Admitting: Family Medicine

## 2019-07-02 DIAGNOSIS — K219 Gastro-esophageal reflux disease without esophagitis: Secondary | ICD-10-CM

## 2019-08-31 ENCOUNTER — Other Ambulatory Visit: Payer: Self-pay

## 2019-08-31 ENCOUNTER — Ambulatory Visit (INDEPENDENT_AMBULATORY_CARE_PROVIDER_SITE_OTHER): Payer: Medicare Other | Admitting: Family Medicine

## 2019-08-31 ENCOUNTER — Encounter: Payer: Self-pay | Admitting: Family Medicine

## 2019-08-31 VITALS — BP 150/82 | HR 81 | Temp 97.9°F | Ht 64.0 in | Wt 155.0 lb

## 2019-08-31 DIAGNOSIS — E782 Mixed hyperlipidemia: Secondary | ICD-10-CM | POA: Diagnosis not present

## 2019-08-31 DIAGNOSIS — R739 Hyperglycemia, unspecified: Secondary | ICD-10-CM | POA: Diagnosis not present

## 2019-08-31 DIAGNOSIS — H6122 Impacted cerumen, left ear: Secondary | ICD-10-CM | POA: Diagnosis not present

## 2019-08-31 DIAGNOSIS — I10 Essential (primary) hypertension: Secondary | ICD-10-CM | POA: Diagnosis not present

## 2019-08-31 DIAGNOSIS — E039 Hypothyroidism, unspecified: Secondary | ICD-10-CM

## 2019-08-31 MED ORDER — FUROSEMIDE 20 MG PO TABS
ORAL_TABLET | ORAL | 3 refills | Status: DC
Start: 2019-08-31 — End: 2019-11-21

## 2019-08-31 MED ORDER — TRIAMCINOLONE ACETONIDE 0.1 % EX CREA
1.0000 | TOPICAL_CREAM | Freq: Two times a day (BID) | CUTANEOUS | 0 refills | Status: DC
Start: 2019-08-31 — End: 2020-01-25

## 2019-08-31 NOTE — Progress Notes (Signed)
Established Patient Office Visit  Subjective:  Patient ID: Madison Hernandez, female    DOB: 12-17-1940  Age: 79 y.o. MRN: [id number]  CC:  Chief Complaint  Patient presents with  . Edema    pt     HPI Madison Hernandez presents for medical follow-up.  She states 2 days ago she had increased leg edema.  She had been up on her feet quite a bit that day with her daughter and had been out to eat.  She had some occasional edema in the past.  She has history of venous stasis.  Denies any dyspnea.  She does take amlodipine 10 mg daily in addition other medications.  She has venous compression hose but does not like to wear them especially in the summer.  Edema tends to be worse late in the day.  No orthopnea.  She has history of severe hyperlipidemia.  She had her Covid vaccine ever since then has had severe myalgias in her arms when taking Zetia or Crestor.  She stopped both of these several weeks ago.  Hypothyroidism on low-dose levothyroxine 25 mcg daily.  Due for follow-up labs.  Her hypertension is treated with amlodipine and benazepril.  Pruritic rash lower legs.  She thinks this may be related to her venous stasis.  She has tried steroid creams in the past which have helped.  Chronic hearing loss.  She has recently noted decreased hearing left ear is worried she has wax buildup again which she has had in the past  Past Medical History:  Diagnosis Date  . Acute ischemic colitis ([place]) 1/12  . Arthritis   . Bleeding ulcer    22 years ago  . Blood in stool   . Cardiac arrhythmia due to congenital heart disease   . Depression   . Diverticulitis   . Dyslipidemia   . Headache(784.0)   . Hyperlipidemia   . Hypertension   . Migraine   . UTI (urinary tract infection)     Past Surgical History:  Procedure Laterality Date  . bleeding ulcers     22 years ago  . vein procedures     right and left lower extremity vein procedures    Family History  Problem Relation Age of Onset  .  Heart disease Mother        age 34  . Diabetes Mother   . Hypertension Mother   . Hypertension Sister     Social History   Socioeconomic History  . Marital status: Single    Spouse name: Not on file  . Number of children: Not on file  . Years of education: Not on file  . Highest education level: Not on file  Occupational History  . Not on file  Tobacco Use  . Smoking status: Former Smoker    Packs/day: 2.00    Years: 10.00    Pack years: 20.00    Types: Cigarettes    Quit date: 01/19/1983    Years since quitting: 36.6  . Smokeless tobacco: Never Used  Vaping Use  . Vaping Use: Never used  Substance and Sexual Activity  . Alcohol use: No  . Drug use: No  . Sexual activity: Not on file  Other Topics Concern  . Not on file  Social History Narrative   She retired early. She is non smoker. Never knew her father. Her mother died age 71 of heart disease. Her half sister [name]   Social Determinants of Health   Financial  Resource Strain:   . Difficulty of Paying Living Expenses:   Food Insecurity:   . Worried About Charity fundraiser in the Last Year:   . Arboriculturist in the Last Year:   Transportation Needs:   . Film/video editor (Medical):   Marland Kitchen Lack of Transportation (Non-Medical):   Physical Activity:   . Days of Exercise per Week:   . Minutes of Exercise per Session:   Stress:   . Feeling of Stress :   Social Connections:   . Frequency of Communication with Friends and Family:   . Frequency of Social Gatherings with Friends and Family:   . Attends Religious Services:   . Active Member of Clubs or Organizations:   . Attends Archivist Meetings:   Marland Kitchen Marital Status:   Intimate Partner Violence:   . Fear of Current or Ex-Partner:   . Emotionally Abused:   Marland Kitchen Physically Abused:   . Sexually Abused:     Outpatient Medications Prior to Visit  Medication Sig Dispense Refill  . amLODipine (NORVASC) 10 MG tablet TAKE ONE-HALF TABLET BY   MOUTH TWICE DAILY 90 tablet 0  . benazepril (LOTENSIN) 20 MG tablet TAKE 1 TABLET BY MOUTH  DAILY 90 tablet 3  . ezetimibe (ZETIA) 10 MG tablet TAKE 1 TABLET BY MOUTH  DAILY 90 tablet 3  . ketoconazole (NIZORAL) 2 % cream Apply 1 application topically daily. 15 g 0  . levothyroxine (SYNTHROID) 25 MCG tablet TAKE 1 TABLET BY MOUTH  DAILY BEFORE BREAKFAST 90 tablet 0  . linaclotide (LINZESS) 145 MCG CAPS capsule Take 1 capsule (145 mcg total) by mouth daily before breakfast. NEEDS OFFICE VISIT FOR FURTHER REFILLS 90 capsule 0  . metroNIDAZOLE (METROCREAM) 0.75 % cream Apply topically 2 (two) times daily. 45 g 0  . pantoprazole (PROTONIX) 20 MG tablet TAKE 1 TABLET BY MOUTH  DAILY 90 tablet 0  . traMADol (ULTRAM) 50 MG tablet Take 50 mg by mouth every 6 (six) hours as needed (for pain).     . rosuvastatin (CRESTOR) 20 MG tablet Take 1 tablet (20 mg total) by mouth daily. NEEDS OFFICE VISIT FOR FURTHER REFILLS (Patient not taking: Reported on 08/31/2019) 90 tablet 0   No facility-administered medications prior to visit.    Allergies  Allergen Reactions  . Avelox [Moxifloxacin Hcl In Nacl] Shortness Of Breath  . Mucinex [Guaifenesin Er] Other (See Comments)    "Makes my heart flutter"  . Sulfa Antibiotics Hives    ROS Review of Systems  Constitutional: Negative for appetite change, chills, fatigue and unexpected weight change.  HENT: Positive for hearing loss.   Eyes: Negative for visual disturbance.  Respiratory: Negative for cough, chest tightness, shortness of breath and wheezing.   Cardiovascular: Negative for chest pain, palpitations and leg swelling.  Endocrine: Negative for polydipsia and polyuria.  Neurological: Negative for dizziness, seizures, syncope, weakness, light-headedness and headaches.      Objective:    Physical Exam HENT:     Ears:     Comments: Cerumen impaction left canal.  Right is clear Cardiovascular:     Rate and Rhythm: Normal rate and regular rhythm.    Pulmonary:     Effort: Pulmonary effort is normal.     Breath sounds: Normal breath sounds.  Musculoskeletal:     Comments: Patient nonpitting edema legs bilaterally  Neurological:     General: No focal deficit present.     BP (!) 150/82   Pulse 81  Temp 97.9 F (36.6 C) (Other (Comment))   Ht 5\' 4"  (1.626 m)   Wt 155 lb (70.3 kg)   SpO2 96%   BMI 26.61 kg/m  Wt Readings from Last 3 Encounters:  08/31/19 155 lb (70.3 kg)  09/06/18 154 lb 4.8 oz (70 kg)  03/08/18 154 lb 3.2 oz (69.9 kg)     Health Maintenance Due  Topic Date Due  . Hepatitis C Screening  Never done  . COVID-19 Vaccine (1) Never done  . DEXA SCAN  Never done  . TETANUS/TDAP  01/21/2008  . INFLUENZA VACCINE  08/19/2019    There are no preventive care reminders to display for this patient.  Lab Results  Component Value Date   TSH 2.50 09/06/2018   Lab Results  Component Value Date   WBC 6.9 08/05/2016   HGB 10.9 (L) 08/05/2016   HCT 33.9 (L) 08/05/2016   MCV 89.7 08/05/2016   PLT 180 08/05/2016   Lab Results  Component Value Date   NA 140 09/06/2018   K 4.3 09/06/2018   CO2 23 09/06/2018   GLUCOSE 106 (H) 09/06/2018   BUN 21 09/06/2018   CREATININE 1.17 09/06/2018   BILITOT 0.4 07/19/2018   ALKPHOS 71 07/19/2018   AST 18 07/19/2018   ALT 17 07/19/2018   PROT 7.3 07/19/2018   ALBUMIN 4.5 07/19/2018   CALCIUM 9.9 09/06/2018   ANIONGAP 10 08/05/2016   GFR 44.91 (L) 09/06/2018   Lab Results  Component Value Date   CHOL 154 09/06/2018   Lab Results  Component Value Date   HDL 60.00 09/06/2018   Lab Results  Component Value Date   LDLCALC 73 09/06/2018   Lab Results  Component Value Date   TRIG 106.0 09/06/2018   Lab Results  Component Value Date   CHOLHDL 3 09/06/2018   Lab Results  Component Value Date   HGBA1C 5.8 04/04/2015      Assessment & Plan:   Problem List Items Addressed This Visit      Unprioritized   Hypothyroid   Relevant Orders   TSH    Hyperglycemia   Relevant Orders   Hemoglobin A1c   Mixed hyperlipidemia   Relevant Medications   furosemide (LASIX) 20 MG tablet   Other Relevant Orders   Lipid panel   Hepatic function panel   HTN (hypertension) - Primary   Relevant Medications   furosemide (LASIX) 20 MG tablet   Other Relevant Orders   Basic metabolic panel    She has mild bilateral leg edema.  Suspect largely related to venous stasis  -Recommend venous compression as tolerated -Prescription for low-dose Lasix 20 mg but take infrequently only for severe edema.  Also recommend elevate legs frequently  -Triamcinolone 0.1% cream twice daily as needed for pruritic rash lower legs  -Check labs above.  Suspect lipids will be elevated.  Consider trial of low-dose Livalo  -Cerumen impaction left canal affecting hearing.  This was up against her eardrum and we could not remove with curette in entirety.  We recommended irrigation after discussing risk including pain, bleeding, low risk of eardrum perforation and patient consented.  This was irrigated by nurse without difficulty and patient tolerated well.  She could hear better afterwards.  -  Meds ordered this encounter  Medications  . furosemide (LASIX) 20 MG tablet    Sig: Take one tablet by mouth daily as needed for leg edema    Dispense:  30 tablet    Refill:  3  Follow-up: No follow-ups on file.    Carolann Littler, MD

## 2019-09-01 LAB — HEPATIC FUNCTION PANEL
AG Ratio: 1.5 (calc) (ref 1.0–2.5)
ALT: 25 U/L (ref 6–29)
AST: 20 U/L (ref 10–35)
Albumin: 4.6 g/dL (ref 3.6–5.1)
Alkaline phosphatase (APISO): 70 U/L (ref 37–153)
Bilirubin, Direct: 0.1 mg/dL (ref 0.0–0.2)
Globulin: 3 g/dL (calc) (ref 1.9–3.7)
Indirect Bilirubin: 0.3 mg/dL (calc) (ref 0.2–1.2)
Total Bilirubin: 0.4 mg/dL (ref 0.2–1.2)
Total Protein: 7.6 g/dL (ref 6.1–8.1)

## 2019-09-01 LAB — LIPID PANEL
Cholesterol: 352 mg/dL — ABNORMAL HIGH (ref <200)
HDL: 64 mg/dL (ref >=50)
LDL Cholesterol (Calc): 262 mg/dL (calc) — ABNORMAL HIGH
Non-HDL Cholesterol (Calc): 288 mg/dL (calc) — ABNORMAL HIGH (ref <130)
Total CHOL/HDL Ratio: 5.5 (calc) — ABNORMAL HIGH (ref <5.0)
Triglycerides: 121 mg/dL (ref <150)

## 2019-09-01 LAB — BASIC METABOLIC PANEL
BUN/Creatinine Ratio: 18 (calc) (ref 6–22)
BUN: 20 mg/dL (ref 7–25)
CO2: 24 mmol/L (ref 20–32)
Calcium: 9.9 mg/dL (ref 8.6–10.4)
Chloride: 102 mmol/L (ref 98–110)
Creat: 1.11 mg/dL — ABNORMAL HIGH (ref 0.60–0.93)
Glucose, Bld: 113 mg/dL — ABNORMAL HIGH (ref 65–99)
Potassium: 4.7 mmol/L (ref 3.5–5.3)
Sodium: 136 mmol/L (ref 135–146)

## 2019-09-01 LAB — TSH: TSH: 3.18 mIU/L (ref 0.40–4.50)

## 2019-09-01 LAB — HEMOGLOBIN A1C
Hgb A1c MFr Bld: 6.5 % of total Hgb — ABNORMAL HIGH (ref <5.7)
Mean Plasma Glucose: 140 (calc)
eAG (mmol/L): 7.7 (calc)

## 2019-09-03 ENCOUNTER — Other Ambulatory Visit: Payer: Self-pay | Admitting: Family Medicine

## 2019-09-03 DIAGNOSIS — E782 Mixed hyperlipidemia: Secondary | ICD-10-CM

## 2019-09-04 ENCOUNTER — Other Ambulatory Visit: Payer: Self-pay | Admitting: *Deleted

## 2019-09-04 DIAGNOSIS — R609 Edema, unspecified: Secondary | ICD-10-CM

## 2019-09-04 DIAGNOSIS — E782 Mixed hyperlipidemia: Secondary | ICD-10-CM

## 2019-09-04 MED ORDER — LIVALO 2 MG PO TABS: 1.0000 | ORAL_TABLET | Freq: Every day | ORAL | 0 refills | Status: DC

## 2019-09-12 ENCOUNTER — Other Ambulatory Visit: Payer: Self-pay | Admitting: Family Medicine

## 2019-09-12 DIAGNOSIS — K219 Gastro-esophageal reflux disease without esophagitis: Secondary | ICD-10-CM

## 2019-09-18 ENCOUNTER — Other Ambulatory Visit: Payer: Self-pay | Admitting: Family Medicine

## 2019-09-25 ENCOUNTER — Telehealth: Payer: Self-pay | Admitting: Family Medicine

## 2019-09-25 NOTE — Telephone Encounter (Signed)
Pt called to say her medication   amLODipine (NORVASC) 10 MG tablet   benazepril (LOTENSIN) 20 MG tablet   levothyroxine (SYNTHROID) 25 MCG tablet   These medications are suppose to be on automatic refill but she cannot get them   [facility], [address] [place], [place], [place] [address]  Phone:  [phone] Fax:  [phone]   Pt would like a call [phone]  Please advise

## 2019-09-26 ENCOUNTER — Other Ambulatory Visit: Payer: Self-pay

## 2019-09-26 MED ORDER — LEVOTHYROXINE SODIUM 25 MCG PO TABS: ORAL_TABLET | ORAL | 0 refills | Status: DC

## 2019-09-26 MED ORDER — AMLODIPINE BESYLATE 10 MG PO TABS: 5.0000 mg | ORAL_TABLET | Freq: Two times a day (BID) | ORAL | 0 refills | Status: DC

## 2019-09-26 NOTE — Telephone Encounter (Signed)
Amlodipine and levothyroxine refilled. Benazapril already refilled in 08/2019 with multiple refills.

## 2019-09-26 NOTE — Telephone Encounter (Signed)
Tried to call pt but no answer or vm

## 2019-10-04 ENCOUNTER — Other Ambulatory Visit: Payer: Self-pay | Admitting: Family Medicine

## 2019-10-23 ENCOUNTER — Other Ambulatory Visit: Payer: Self-pay

## 2019-10-23 ENCOUNTER — Encounter: Payer: Self-pay | Admitting: Family Medicine

## 2019-10-23 ENCOUNTER — Ambulatory Visit (INDEPENDENT_AMBULATORY_CARE_PROVIDER_SITE_OTHER): Payer: Medicare Other | Admitting: Family Medicine

## 2019-10-23 VITALS — BP 128/70 | HR 83 | Temp 97.7°F | Resp 16 | Ht 64.0 in | Wt 152.0 lb

## 2019-10-23 DIAGNOSIS — R21 Rash and other nonspecific skin eruption: Secondary | ICD-10-CM

## 2019-10-23 DIAGNOSIS — R3915 Urgency of urination: Secondary | ICD-10-CM

## 2019-10-23 NOTE — Progress Notes (Signed)
ACUTE VISIT Chief Complaint  Patient presents with  . Allergic Reaction    Cefdinir   HPI: [name] is a 79 y.o. female, who is here today complaining of upper extremity pruritic skin rash, which she attributes to possible antibiotic allergy reaction. No known sick contact. No new detergent, outdoor exposures/insect bite, or other new skin problems.  She started antibiotic treatment on 10/15/2019 Cefdinir 300 mg x 7 days, she noted a rash at day 4th, she still completed treatment. His skin rash is not getting worse. She denies history of eczema or similar rash in the past. Negative for oral lesions, cough, wheezing, or dyspnea. She has not tried OTC medications.  Still having some "little bit" of discomfort with urination, burning sensation.States that she has to "push" to urinate and takes her long to empty bladder.  "Little" urgency and frequency, which she has had for "long time." She denies gross hematuria, decreased urine output, or suprapubic abdominal pain. Negative for fever, unusual fatigue, or changes in appetite.  Review of Systems  Constitutional: Negative for activity change and chills.  Gastrointestinal: Negative for abdominal pain, nausea and vomiting.  Genitourinary: Negative for difficulty urinating, vaginal bleeding and vaginal discharge.  Musculoskeletal: Negative for gait problem and myalgias.  Psychiatric/Behavioral: Negative for confusion. The patient is nervous/anxious.   Rest see pertinent positives and negatives per HPI.  Current Outpatient Medications on File Prior to Visit  Medication Sig Dispense Refill  . amLODipine (NORVASC) 10 MG tablet TAKE ONE-HALF TABLET BY  MOUTH TWICE DAILY 90 tablet 3  . benazepril (LOTENSIN) 20 MG tablet TAKE 1 TABLET BY MOUTH  DAILY 90 tablet 3  . ezetimibe (ZETIA) 10 MG tablet TAKE 1 TABLET BY MOUTH  DAILY 90 tablet 3  . furosemide (LASIX) 20 MG tablet Take one tablet by mouth daily as needed for leg edema 30  tablet 3  . ketoconazole (NIZORAL) 2 % cream Apply 1 application topically daily. 15 g 0  . levothyroxine (SYNTHROID) 25 MCG tablet TAKE 1 TABLET BY MOUTH  DAILY BEFORE BREAKFAST 90 tablet 3  . LINZESS 145 MCG CAPS capsule TAKE 1 CAPSULE BY MOUTH  DAILY BEFORE BREAKFAST  NEEDS OFFICE VISIT FOR  FURTHER REFILLS 90 capsule 3  . metroNIDAZOLE (METROCREAM) 0.75 % cream Apply topically 2 (two) times daily. 45 g 0  . pantoprazole (PROTONIX) 20 MG tablet TAKE 1 TABLET BY MOUTH  DAILY 90 tablet 3  . Pitavastatin Calcium (LIVALO) 2 MG TABS Take 1 tablet (2 mg total) by mouth daily. 90 tablet 0  . rosuvastatin (CRESTOR) 20 MG tablet Take 1 tablet (20 mg total) by mouth daily. NEEDS OFFICE VISIT FOR FURTHER REFILLS 90 tablet 0  . traMADol (ULTRAM) 50 MG tablet Take 50 mg by mouth every 6 (six) hours as needed (for pain).     . triamcinolone cream (KENALOG) 0.1 % Apply 1 application topically 2 (two) times daily. 30 g 0   No current facility-administered medications on file prior to visit.   Past Medical History:  Diagnosis Date  . Acute ischemic colitis ([place]) 1/12  . Arthritis   . Bleeding ulcer    22 years ago  . Blood in stool   . Cardiac arrhythmia due to congenital heart disease   . Depression   . Diverticulitis   . Dyslipidemia   . Headache(784.0)   . Hyperlipidemia   . Hypertension   . Migraine   . UTI (urinary tract infection)    Allergies  Allergen Reactions  .  Avelox [Moxifloxacin Hcl In Nacl] Shortness Of Breath  . Mucinex [Guaifenesin Er] Other (See Comments)    "Makes my heart flutter"  . Sulfa Antibiotics Hives  . Cefdinir Rash    Social History   Socioeconomic History  . Marital status: Single    Spouse name: Not on file  . Number of children: Not on file  . Years of education: Not on file  . Highest education level: Not on file  Occupational History  . Not on file  Tobacco Use  . Smoking status: Former Smoker    Packs/day: 2.00    Years: 10.00    Pack years:  20.00    Types: Cigarettes    Quit date: 01/19/1983    Years since quitting: 36.7  . Smokeless tobacco: Never Used  Vaping Use  . Vaping Use: Never used  Substance and Sexual Activity  . Alcohol use: No  . Drug use: No  . Sexual activity: Not on file  Other Topics Concern  . Not on file  Social History Narrative   She retired early. She is non smoker. Never knew her father. Her mother died age 78 of heart disease. Her half sister Madison Hernandez   Social Determinants of Health   Financial Resource Strain:   . Difficulty of Paying Living Expenses: Not on file  Food Insecurity:   . Worried About Charity fundraiser in the Last Year: Not on file  . Ran Out of Food in the Last Year: Not on file  Transportation Needs:   . Lack of Transportation (Medical): Not on file  . Lack of Transportation (Non-Medical): Not on file  Physical Activity:   . Days of Exercise per Week: Not on file  . Minutes of Exercise per Session: Not on file  Stress:   . Feeling of Stress : Not on file  Social Connections:   . Frequency of Communication with Friends and Family: Not on file  . Frequency of Social Gatherings with Friends and Family: Not on file  . Attends Religious Services: Not on file  . Active Member of Clubs or Organizations: Not on file  . Attends Archivist Meetings: Not on file  . Marital Status: Not on file    Vitals:   10/23/19 1009  BP: 128/70  Pulse: 83  Resp: 16  Temp: 97.7 F (36.5 C)  SpO2: 98%   Body mass index is 26.09 kg/m.  Physical Exam Vitals and nursing note reviewed.  Constitutional:      General: She is not in acute distress.    Appearance: She is well-developed.  HENT:     Head: Normocephalic and atraumatic.     Mouth/Throat:     Mouth: Mucous membranes are moist.     Pharynx: Oropharynx is clear.  Eyes:     Conjunctiva/sclera: Conjunctivae normal.  Pulmonary:     Effort: Pulmonary effort is normal. No respiratory distress.     Breath  sounds: Normal breath sounds.  Abdominal:     Palpations: Abdomen is soft. There is no mass.     Tenderness: There is no abdominal tenderness. There is no right CVA tenderness or left CVA tenderness.  Lymphadenopathy:     Cervical: No cervical adenopathy.  Skin:    General: Skin is warm.     Findings: Rash present.          Comments: Maculopapular erythematous rash noted on left forearm and dorsum of hand, she has some superficial excoriations from scratching. A  few similar lesions on right forearm.  Neurological:     Mental Status: She is alert and oriented to person, place, and time.     Gait: Gait normal.  Psychiatric:        Mood and Affect: Mood is anxious.        Speech: Speech normal.     Comments: Well groomed, good eye contact.   ASSESSMENT AND PLAN:  Ms. Averey was seen today for allergic reaction.  Diagnoses and all orders for this visit:  Skin rash I am not certain this rash is caused by recent abx, distribution of rash is not the typical presentation for medicamentosus allergy reaction. She has at home triamcinolone 0.1% prescribed sometime ago for pruritic lesions on lower extremities, she has not used medication.  Recommend applying topical steroid twice daily, and small amount, for 14 days. Monitor for signs of infection, avoid scratching.  Urgency of urination Reported symptoms do not suggest UTI at this time, she just completed 7 days of cefdinir.       ?  Cystocele, overactive bladder. UA and UCx ordered.  Candida vulvovaginitis could also cause burning sensation. OTC Monistat may help. We will give further recommendations according to Ucx results.   Return in about 2 weeks (around 11/06/2019), or if symptoms worsen or fail to improve.   Madison Hernandez G. [place], MD  [facility]. [facility] office.    A few things to remember from today's visit:   Skin rash  Urgency of urination  If you need refills please call your pharmacy. Do not use My  Chart to request refills or for acute issues that need immediate attention.  I am not sure antibiotic caused rash. Triamcinolone cream you have at home can be used, apply small amount on affected area 2 times per day and for 14 days. Urine test done today  In regard to urine , some other problem can cause similar symptoms. Take your time to urinate and go as soon as you feel the urge.   Please be sure medication list is accurate. If a new problem present, please set up appointment sooner than planned today.

## 2019-10-23 NOTE — Patient Instructions (Signed)
A few things to remember from today's visit:   Skin rash  Urgency of urination  If you need refills please call your pharmacy. Do not use My Chart to request refills or for acute issues that need immediate attention.  I am not sure antibiotic caused rash. Triamcinolone cream you have at home can be used, apply small amount on affected area 2 times per day and for 14 days. Urine test done today  In regard to urine , some other problem can cause similar symptoms. Take your time to urinate and go as soon as you feel the urge.   Please be sure medication list is accurate. If a new problem present, please set up appointment sooner than planned today.

## 2019-10-24 ENCOUNTER — Telehealth: Payer: Self-pay | Admitting: Family Medicine

## 2019-10-24 ENCOUNTER — Encounter: Payer: Self-pay | Admitting: Family Medicine

## 2019-10-24 LAB — URINE CULTURE
MICRO NUMBER:: 11033154
SPECIMEN QUALITY:: ADEQUATE

## 2019-10-24 LAB — URINALYSIS, ROUTINE W REFLEX MICROSCOPIC
Bilirubin Urine: NEGATIVE
Glucose, UA: NEGATIVE
Hgb urine dipstick: NEGATIVE
Ketones, ur: NEGATIVE
Leukocytes,Ua: NEGATIVE
Nitrite: NEGATIVE
Protein, ur: NEGATIVE
Specific Gravity, Urine: 1.009 (ref 1.001–1.03)
pH: <=5 (ref 5.0–8.0)

## 2019-10-24 NOTE — Telephone Encounter (Signed)
Pt would like to know  her Urinalysis results

## 2019-10-24 NOTE — Telephone Encounter (Signed)
Urine was clear. She just completed 7 days of strong abx. Other possible causes for urinary symptoms need to be considered. She can try topical monistat and will give recommendations according to Ucx. Thanks, BJ

## 2019-10-24 NOTE — Telephone Encounter (Signed)
Madison Hernandez states that when she starts to urinate it burns real bad/she has urgency/I advised her that her C&S has not resulted yet/denies any flank or pelvic pain/she has been Tx with AZO to try and keep from hurting

## 2019-10-25 NOTE — Telephone Encounter (Signed)
Pt aware and will try OTC Monistat/thx dmf

## 2019-11-05 ENCOUNTER — Other Ambulatory Visit: Payer: Self-pay | Admitting: Family Medicine

## 2019-11-05 ENCOUNTER — Other Ambulatory Visit (INDEPENDENT_AMBULATORY_CARE_PROVIDER_SITE_OTHER): Payer: Medicare Other

## 2019-11-05 ENCOUNTER — Other Ambulatory Visit: Payer: Self-pay

## 2019-11-05 DIAGNOSIS — E782 Mixed hyperlipidemia: Secondary | ICD-10-CM

## 2019-11-13 LAB — LIPID PANEL
Cholesterol: 353 mg/dL — ABNORMAL HIGH (ref <200)
HDL: 60 mg/dL (ref >=50)
LDL Cholesterol (Calc): 262 mg/dL (calc) — ABNORMAL HIGH
Non-HDL Cholesterol (Calc): 293 mg/dL (calc) — ABNORMAL HIGH (ref <130)
Total CHOL/HDL Ratio: 5.9 (calc) — ABNORMAL HIGH (ref <5.0)
Triglycerides: 148 mg/dL (ref <150)

## 2019-11-13 LAB — TIQ-MISC

## 2019-11-14 ENCOUNTER — Telehealth: Payer: Self-pay | Admitting: Family Medicine

## 2019-11-14 NOTE — Telephone Encounter (Signed)
Patient informed of the message below.

## 2019-11-14 NOTE — Telephone Encounter (Signed)
The patient went and picked up her Rx for   amLODipine (NORVASC) 10 MG tablet  And the instructions say to take 1/2 tablet twice a day and she's been taking 1 tablet once a day and she wants to know when these instructions were changed.  Please advise

## 2019-11-14 NOTE — Telephone Encounter (Signed)
As far back as 2020 the instructions stated to take 1/2 tablet ( 5 mg) twice a day  Her labs show that her cholesterol panel is still elevated, I am not sure what Dr. Erick Blinks plan is for her.

## 2019-11-14 NOTE — Telephone Encounter (Signed)
Patient would also like to go over her labs results

## 2019-11-21 ENCOUNTER — Other Ambulatory Visit: Payer: Self-pay

## 2019-11-21 MED ORDER — LEVOTHYROXINE SODIUM 25 MCG PO TABS: 25.0000 ug | ORAL_TABLET | Freq: Every day | ORAL | 3 refills | Status: DC

## 2019-11-21 MED ORDER — FUROSEMIDE 20 MG PO TABS: ORAL_TABLET | ORAL | 3 refills | Status: DC

## 2019-11-21 MED ORDER — BENAZEPRIL HCL 20 MG PO TABS
20.0000 mg | ORAL_TABLET | Freq: Every day | ORAL | 3 refills | Status: DC
Start: 2019-11-21 — End: 2020-08-01

## 2019-11-23 ENCOUNTER — Telehealth: Payer: Self-pay | Admitting: *Deleted

## 2019-11-23 NOTE — Telephone Encounter (Signed)
Patient called stating she re enrolled with [employer] medicare advantage, patient states she was going to go with [facility] but she couldn't keep Dr Elease Hashimoto has her PCP so she went back with what she has. Patient states we have sent medication on her [employer] plan. Please call patient [phone]

## 2019-11-26 NOTE — Telephone Encounter (Signed)
Spoke with pt state that she received a letter stating that her coverage is not changing and that she will keep Dr Elease Hashimoto as her PCP.

## 2019-11-26 NOTE — Telephone Encounter (Signed)
Called pt left a message to call the office regarding her questions about her change in insurance

## 2019-12-24 ENCOUNTER — Encounter: Payer: Self-pay | Admitting: Family Medicine

## 2019-12-24 ENCOUNTER — Other Ambulatory Visit: Payer: Self-pay

## 2019-12-24 ENCOUNTER — Ambulatory Visit (INDEPENDENT_AMBULATORY_CARE_PROVIDER_SITE_OTHER): Payer: Medicare Other | Admitting: Family Medicine

## 2019-12-24 VITALS — BP 136/70 | HR 81 | Temp 97.7°F | Resp 12 | Ht 64.0 in | Wt 151.1 lb

## 2019-12-24 DIAGNOSIS — E782 Mixed hyperlipidemia: Secondary | ICD-10-CM

## 2019-12-24 DIAGNOSIS — I10 Essential (primary) hypertension: Secondary | ICD-10-CM

## 2019-12-24 DIAGNOSIS — G4700 Insomnia, unspecified: Secondary | ICD-10-CM | POA: Diagnosis not present

## 2019-12-24 MED ORDER — ESZOPICLONE 1 MG PO TABS: 1.0000 mg | ORAL_TABLET | Freq: Every evening | ORAL | 1 refills | Status: DC | PRN

## 2019-12-24 NOTE — Progress Notes (Signed)
Established Patient Office Visit  Subjective:  Patient ID: Madison Hernandez, female    DOB: [birth year]  Age: 79 y.o. MRN: [id number]  CC:  Chief Complaint  Patient presents with  . follow up to discuss cholesterol  . Insomnia    HPI Madison Hernandez presents for follow-up regarding the following issues  She relates issues with insomnia really for several years.  She has difficulty falling asleep and staying asleep.  She states that some nights she gets almost no sleep.  She avoids daytime naps.  She drinks minimal amount of caffeine early in the mornings but not late in the day.  No alcohol use.  She tried Benadryl and melatonin without improvement.  Denies any depression issues.  She feels very fatigued from not getting any sleep.  She does sometimes watch TV at night and sometimes in the middle the night when she cannot sleep.  She has history of severe dyslipidemia.  She had been on Crestor but developed some headaches and quit taking this.  When lipids were last checked a month ago she had cholesterol 353 with HDL 60, triglycerides 148, LDL 262.  She states her mom has severe hyperlipidemia.  We switched her over to [facility] and she is currently taking this regularly 2 mg daily.  She was not taking this consistently at last check.  She has hypertension history.  Currently takes benazepril and amlodipine.  She states she has been consistent with therapy.  She has hypothyroidism and had TSH checked in August which was normal  Past Medical History:  Diagnosis Date  . Acute ischemic colitis ([place]) 1/12  . Arthritis   . Bleeding ulcer    22 years ago  . Blood in stool   . Cardiac arrhythmia due to congenital heart disease   . Depression   . Diverticulitis   . Dyslipidemia   . Headache(784.0)   . Hyperlipidemia   . Hypertension   . Migraine   . UTI (urinary tract infection)     Past Surgical History:  Procedure Laterality Date  . bleeding ulcers     22 years ago  . vein  procedures     right and left lower extremity vein procedures    Family History  Problem Relation Age of Onset  . Heart disease Mother        age 21  . Diabetes Mother   . Hypertension Mother   . Hypertension Sister     Social History   Socioeconomic History  . Marital status: Single    Spouse name: Not on file  . Number of children: Not on file  . Years of education: Not on file  . Highest education level: Not on file  Occupational History  . Not on file  Tobacco Use  . Smoking status: Former Smoker    Packs/day: 2.00    Years: 10.00    Pack years: 20.00    Types: Cigarettes    Quit date: 01/19/1983    Years since quitting: 36.9  . Smokeless tobacco: Never Used  Vaping Use  . Vaping Use: Never used  Substance and Sexual Activity  . Alcohol use: No  . Drug use: No  . Sexual activity: Not on file  Other Topics Concern  . Not on file  Social History Narrative   She retired early. She is non smoker. Never knew her father. Her mother died age 39 of heart disease. Her half sister Madison Hernandez   Social Determinants of Health  Financial Resource Strain:   . Difficulty of Paying Living Expenses: Not on file  Food Insecurity:   . Worried About Charity fundraiser in the Last Year: Not on file  . Ran Out of Food in the Last Year: Not on file  Transportation Needs:   . Lack of Transportation (Medical): Not on file  . Lack of Transportation (Non-Medical): Not on file  Physical Activity:   . Days of Exercise per Week: Not on file  . Minutes of Exercise per Session: Not on file  Stress:   . Feeling of Stress : Not on file  Social Connections:   . Frequency of Communication with Friends and Family: Not on file  . Frequency of Social Gatherings with Friends and Family: Not on file  . Attends Religious Services: Not on file  . Active Member of Clubs or Organizations: Not on file  . Attends Archivist Meetings: Not on file  . Marital Status: Not on file    Intimate Partner Violence:   . Fear of Current or Ex-Partner: Not on file  . Emotionally Abused: Not on file  . Physically Abused: Not on file  . Sexually Abused: Not on file    Outpatient Medications Prior to Visit  Medication Sig Dispense Refill  . amLODipine (NORVASC) 10 MG tablet TAKE ONE-HALF TABLET BY  MOUTH TWICE DAILY 90 tablet 3  . benazepril (LOTENSIN) 20 MG tablet Take 1 tablet (20 mg total) by mouth daily. 90 tablet 3  . ezetimibe (ZETIA) 10 MG tablet TAKE 1 TABLET BY MOUTH  DAILY 90 tablet 3  . furosemide (LASIX) 20 MG tablet Take one tablet by mouth daily as needed for leg edema 90 tablet 3  . gabapentin (NEURONTIN) 100 MG capsule Take 100 mg by mouth 3 (three) times daily.    Marland Kitchen ketoconazole (NIZORAL) 2 % cream Apply 1 application topically daily. 15 g 0  . levothyroxine (SYNTHROID) 25 MCG tablet Take 1 tablet (25 mcg total) by mouth daily before breakfast. 90 tablet 3  . LINZESS 145 MCG CAPS capsule TAKE 1 CAPSULE BY MOUTH  DAILY BEFORE BREAKFAST  NEEDS OFFICE VISIT FOR  FURTHER REFILLS 90 capsule 3  . LIVALO 2 MG TABS TAKE 1 TABLET BY MOUTH  DAILY 90 tablet 3  . metroNIDAZOLE (METROCREAM) 0.75 % cream Apply topically 2 (two) times daily. 45 g 0  . pantoprazole (PROTONIX) 20 MG tablet TAKE 1 TABLET BY MOUTH  DAILY 90 tablet 3  . RESTASIS 0.05 % ophthalmic emulsion as directed.    . traMADol (ULTRAM) 50 MG tablet Take 50 mg by mouth every 6 (six) hours as needed (for pain).     . triamcinolone cream (KENALOG) 0.1 % Apply 1 application topically 2 (two) times daily. 30 g 0  . rosuvastatin (CRESTOR) 20 MG tablet Take 1 tablet (20 mg total) by mouth daily. NEEDS OFFICE VISIT FOR FURTHER REFILLS 90 tablet 0   No facility-administered medications prior to visit.    Allergies  Allergen Reactions  . Avelox [Moxifloxacin Hcl In Nacl] Shortness Of Breath  . Mucinex [Guaifenesin Er] Other (See Comments)    "Makes my heart flutter"  . Sulfa Antibiotics Hives  . Cefdinir Rash     ROS Review of Systems  Constitutional: Negative for fatigue and unexpected weight change.  Eyes: Negative for visual disturbance.  Respiratory: Negative for cough, chest tightness, shortness of breath and wheezing.   Cardiovascular: Negative for chest pain, palpitations and leg swelling.  Endocrine: Negative  for polydipsia and polyuria.  Genitourinary: Negative for dysuria.  Neurological: Negative for dizziness, seizures, syncope, weakness, light-headedness and headaches.      Objective:    Physical Exam Constitutional:      Appearance: She is well-developed.  Eyes:     Pupils: Pupils are equal, round, and reactive to light.  Neck:     Thyroid: No thyromegaly.     Vascular: No JVD.  Cardiovascular:     Rate and Rhythm: Normal rate and regular rhythm.     Heart sounds: No gallop.   Pulmonary:     Effort: Pulmonary effort is normal. No respiratory distress.     Breath sounds: Normal breath sounds. No wheezing or rales.  Musculoskeletal:     Cervical back: Neck supple.     Right lower leg: No edema.     Left lower leg: No edema.  Neurological:     Mental Status: She is alert.     BP 136/70 (BP Location: Left Arm, Cuff Size: Normal)   Pulse 81   Temp 97.7 F (36.5 C) (Oral)   Resp 12   Ht 5\' 4"  (1.626 m)   Wt 151 lb 1.6 oz (68.5 kg)   SpO2 97%   BMI 25.94 kg/m  Wt Readings from Last 3 Encounters:  12/24/19 151 lb 1.6 oz (68.5 kg)  10/23/19 152 lb (68.9 kg)  08/31/19 155 lb (70.3 kg)     Health Maintenance Due  Topic Date Due  . Hepatitis C Screening  Never done  . DEXA SCAN  Never done  . TETANUS/TDAP  01/21/2008    There are no preventive care reminders to display for this patient.  Lab Results  Component Value Date   TSH 3.18 08/31/2019   Lab Results  Component Value Date   WBC 6.9 08/05/2016   HGB 10.9 (L) 08/05/2016   HCT 33.9 (L) 08/05/2016   MCV 89.7 08/05/2016   PLT 180 08/05/2016   Lab Results  Component Value Date   NA 136  08/31/2019   K 4.7 08/31/2019   CO2 24 08/31/2019   GLUCOSE 113 (H) 08/31/2019   BUN 20 08/31/2019   CREATININE 1.11 (H) 08/31/2019   BILITOT 0.4 08/31/2019   ALKPHOS 71 07/19/2018   AST 20 08/31/2019   ALT 25 08/31/2019   PROT 7.6 08/31/2019   ALBUMIN 4.5 07/19/2018   CALCIUM 9.9 08/31/2019   ANIONGAP 10 08/05/2016   GFR 44.91 (L) 09/06/2018   Lab Results  Component Value Date   CHOL 353 (H) 11/05/2019   Lab Results  Component Value Date   HDL 60 11/05/2019   Lab Results  Component Value Date   LDLCALC 262 (H) 11/05/2019   Lab Results  Component Value Date   TRIG 148 11/05/2019   Lab Results  Component Value Date   CHOLHDL 5.9 (H) 11/05/2019   Lab Results  Component Value Date   HGBA1C 6.5 (H) 08/31/2019      Assessment & Plan:   #1 hypertension stable.  Repeat blood pressure after rest slightly improved  -Continue benazepril and amlodipine  #2 severe dyslipidemia with recent cholesterol 353.  Patient now back on Livalo regularly.  -Recheck fasting lipid and hepatic panel today -Consider further titration of Livalo versus addition of Zetia depending on results  #3 insomnia. -Sleep hygiene discussed with handout given -We discussed limited trial of Lunesta 1 mg nightly.  Reviewed potential side effects.  Try to avoid regular use. -Bring back in 1 month reassess -We  recommend avoiding daytime naps, avoiding caffeine after about 2 PM, avoiding bright lights before bedtime such as TV  Meds ordered this encounter  Medications  . eszopiclone (LUNESTA) 1 MG TABS tablet    Sig: Take 1 tablet (1 mg total) by mouth at bedtime as needed for sleep. Take immediately before bedtime    Dispense:  30 tablet    Refill:  1    Follow-up: Return in about 1 month (around 01/24/2020).    Carolann Littler, MD

## 2019-12-24 NOTE — Patient Instructions (Signed)

## 2019-12-25 LAB — HEPATIC FUNCTION PANEL
AG Ratio: 1.6 (calc) (ref 1.0–2.5)
ALT: 16 U/L (ref 6–29)
AST: 17 U/L (ref 10–35)
Albumin: 4.7 g/dL (ref 3.6–5.1)
Alkaline phosphatase (APISO): 67 U/L (ref 37–153)
Bilirubin, Direct: 0 mg/dL (ref 0.0–0.2)
Globulin: 2.9 g/dL (calc) (ref 1.9–3.7)
Indirect Bilirubin: 0.4 mg/dL (calc) (ref 0.2–1.2)
Total Bilirubin: 0.4 mg/dL (ref 0.2–1.2)
Total Protein: 7.6 g/dL (ref 6.1–8.1)

## 2019-12-25 LAB — LIPID PANEL
Cholesterol: 335 mg/dL — ABNORMAL HIGH (ref <200)
HDL: 63 mg/dL (ref >=50)
LDL Cholesterol (Calc): 237 mg/dL (calc) — ABNORMAL HIGH
Non-HDL Cholesterol (Calc): 272 mg/dL (calc) — ABNORMAL HIGH (ref <130)
Total CHOL/HDL Ratio: 5.3 (calc) — ABNORMAL HIGH (ref <5.0)
Triglycerides: 176 mg/dL — ABNORMAL HIGH (ref <150)

## 2019-12-26 ENCOUNTER — Telehealth: Payer: Self-pay

## 2019-12-26 MED ORDER — LIVALO 4 MG PO TABS: 4.0000 mg | ORAL_TABLET | Freq: Every day | ORAL | 3 refills | Status: DC

## 2019-12-26 NOTE — Telephone Encounter (Signed)
-----   Message from Eulas Post, MD sent at 12/26/2019  5:32 AM EST ----- Lipids are still very high.   Confirm if she is taking her Livalo daily.  If so, increase Livalo to 4 mg po qd and repeat fasting lipids in 6 weeks.  If not further improved at that time will consider refer to cardiology lipid clinic.

## 2019-12-26 NOTE — Telephone Encounter (Signed)
Patient informed of results and recommendations.  Appointment made.

## 2019-12-31 ENCOUNTER — Other Ambulatory Visit: Payer: Self-pay | Admitting: Family Medicine

## 2019-12-31 DIAGNOSIS — E039 Hypothyroidism, unspecified: Secondary | ICD-10-CM

## 2020-01-23 ENCOUNTER — Other Ambulatory Visit: Payer: Medicare Other

## 2020-01-24 ENCOUNTER — Other Ambulatory Visit: Payer: Self-pay

## 2020-01-24 DIAGNOSIS — M19019 Primary osteoarthritis, unspecified shoulder: Secondary | ICD-10-CM | POA: Diagnosis not present

## 2020-01-24 DIAGNOSIS — I10 Essential (primary) hypertension: Secondary | ICD-10-CM | POA: Diagnosis not present

## 2020-01-24 DIAGNOSIS — M791 Myalgia, unspecified site: Secondary | ICD-10-CM | POA: Diagnosis not present

## 2020-01-24 DIAGNOSIS — M5412 Radiculopathy, cervical region: Secondary | ICD-10-CM | POA: Diagnosis not present

## 2020-01-24 DIAGNOSIS — M546 Pain in thoracic spine: Secondary | ICD-10-CM | POA: Diagnosis not present

## 2020-01-24 DIAGNOSIS — E1140 Type 2 diabetes mellitus with diabetic neuropathy, unspecified: Secondary | ICD-10-CM | POA: Diagnosis not present

## 2020-01-24 DIAGNOSIS — M7542 Impingement syndrome of left shoulder: Secondary | ICD-10-CM | POA: Diagnosis not present

## 2020-01-24 DIAGNOSIS — Z79899 Other long term (current) drug therapy: Secondary | ICD-10-CM | POA: Diagnosis not present

## 2020-01-25 ENCOUNTER — Ambulatory Visit (INDEPENDENT_AMBULATORY_CARE_PROVIDER_SITE_OTHER): Payer: Medicare Other | Admitting: Family Medicine

## 2020-01-25 ENCOUNTER — Encounter: Payer: Self-pay | Admitting: Family Medicine

## 2020-01-25 VITALS — BP 128/70 | HR 68 | Ht 64.0 in | Wt 156.0 lb

## 2020-01-25 DIAGNOSIS — E782 Mixed hyperlipidemia: Secondary | ICD-10-CM

## 2020-01-25 DIAGNOSIS — I10 Essential (primary) hypertension: Secondary | ICD-10-CM

## 2020-01-25 DIAGNOSIS — F5101 Primary insomnia: Secondary | ICD-10-CM | POA: Diagnosis not present

## 2020-01-25 DIAGNOSIS — R1032 Left lower quadrant pain: Secondary | ICD-10-CM | POA: Insufficient documentation

## 2020-01-25 DIAGNOSIS — K5900 Constipation, unspecified: Secondary | ICD-10-CM | POA: Insufficient documentation

## 2020-01-25 DIAGNOSIS — K5730 Diverticulosis of large intestine without perforation or abscess without bleeding: Secondary | ICD-10-CM | POA: Insufficient documentation

## 2020-01-25 LAB — LIPID PANEL
Cholesterol: 222 mg/dL — ABNORMAL HIGH (ref 0–200)
HDL: 65.8 mg/dL (ref >39.00)
LDL Cholesterol: 127 mg/dL — ABNORMAL HIGH (ref 0–99)
NonHDL: 156.14
Total CHOL/HDL Ratio: 3
Triglycerides: 147 mg/dL (ref 0.0–149.0)
VLDL: 29.4 mg/dL (ref 0.0–40.0)

## 2020-01-25 MED ORDER — TRAZODONE HCL 50 MG PO TABS
25.0000 mg | ORAL_TABLET | Freq: Every evening | ORAL | 11 refills | Status: DC | PRN
Start: 2020-01-25 — End: 2023-03-01

## 2020-01-25 MED ORDER — TRIAMCINOLONE ACETONIDE 0.1 % EX CREA
1.0000 | TOPICAL_CREAM | Freq: Two times a day (BID) | CUTANEOUS | 1 refills | Status: DC
Start: 2020-01-25 — End: 2020-12-17

## 2020-01-25 NOTE — Patient Instructions (Signed)

## 2020-01-25 NOTE — Progress Notes (Signed)
Established Patient Office Visit  Subjective:  Patient ID: Madison Hernandez, female    DOB: [birth year]  Age: 80 y.o. MRN: [id number]  CC:  Chief Complaint  Patient presents with  . Insomnia    HPI Madison Hernandez presents for medical follow-up.  We discussed several items as follows  Chronic insomnia.  She has tried things like Benadryl without limited relief.  No relief with melatonin.  We recently tried [place] initially 1 mg and that she double this up to 2 mg but did not see any improvement.  She states that she sometimes can stay awake almost the whole night.  She has not credible difficulty getting to sleep.  No alcohol use.  No late day use of caffeine.  Does not recall trying other prescription medications such as trazodone previously.  History of severe hyperlipidemia.  She is currently taking Livalo 4 mg daily.  For some time she quit taking this because she had some arm soreness but in retrospect she thinks her arm soreness was secondary to a vaccine.  She states she has been taking this regularly since last lipids were checked.  She had been on Zetia but is currently taking the Livalo alone.  No generalized myalgias.  Intermittent rash.  She is used triamcinolone in the past with good success and requesting refills.  She has hypertension treated with amlodipine and benazepril.  Compliant with medications.  No headaches or dizziness.  Past Medical History:  Diagnosis Date  . Acute ischemic colitis ([place]) 1/12  . Arthritis   . Bleeding ulcer    22 years ago  . Blood in stool   . Cardiac arrhythmia due to congenital heart disease   . Depression   . Diverticulitis   . Dyslipidemia   . Headache(784.0)   . Hyperlipidemia   . Hypertension   . Migraine   . UTI (urinary tract infection)     Past Surgical History:  Procedure Laterality Date  . bleeding ulcers     22 years ago  . vein procedures     right and left lower extremity vein procedures    Family History   Problem Relation Age of Onset  . Heart disease Mother        age 72  . Diabetes Mother   . Hypertension Mother   . Hypertension Sister     Social History   Socioeconomic History  . Marital status: Single    Spouse name: Not on file  . Number of children: Not on file  . Years of education: Not on file  . Highest education level: Not on file  Occupational History  . Not on file  Tobacco Use  . Smoking status: Former Smoker    Packs/day: 2.00    Years: 10.00    Pack years: 20.00    Types: Cigarettes    Quit date: 01/19/1983    Years since quitting: 37.0  . Smokeless tobacco: Never Used  Vaping Use  . Vaping Use: Never used  Substance and Sexual Activity  . Alcohol use: No  . Drug use: No  . Sexual activity: Not on file  Other Topics Concern  . Not on file  Social History Narrative   She retired early. She is non smoker. Never knew her father. Her mother died age 72 of heart disease. Her half sister [name]   Social Determinants of Health   Financial Resource Strain: Not on file  Food Insecurity: Not on file  Transportation Needs:  Not on file  Physical Activity: Not on file  Stress: Not on file  Social Connections: Not on file  Intimate Partner Violence: Not on file    Outpatient Medications Prior to Visit  Medication Sig Dispense Refill  . amLODipine (NORVASC) 10 MG tablet TAKE ONE-HALF TABLET BY  MOUTH TWICE DAILY 90 tablet 3  . benazepril (LOTENSIN) 20 MG tablet Take 1 tablet (20 mg total) by mouth daily. 90 tablet 3  . ezetimibe (ZETIA) 10 MG tablet TAKE 1 TABLET BY MOUTH  DAILY 90 tablet 3  . furosemide (LASIX) 20 MG tablet Take one tablet by mouth daily as needed for leg edema 90 tablet 3  . gabapentin (NEURONTIN) 100 MG capsule Take 100 mg by mouth 3 (three) times daily.    Marland Kitchen ketoconazole (NIZORAL) 2 % cream Apply 1 application topically daily. 15 g 0  . levothyroxine (SYNTHROID) 25 MCG tablet Take 1 tablet (25 mcg total) by mouth daily before  breakfast. 90 tablet 3  . LINZESS 145 MCG CAPS capsule TAKE 1 CAPSULE BY MOUTH  DAILY BEFORE BREAKFAST  NEEDS OFFICE VISIT FOR  FURTHER REFILLS 90 capsule 3  . metroNIDAZOLE (METROCREAM) 0.75 % cream Apply topically 2 (two) times daily. 45 g 0  . pantoprazole (PROTONIX) 20 MG tablet TAKE 1 TABLET BY MOUTH  DAILY 90 tablet 3  . Pitavastatin Calcium (LIVALO) 4 MG TABS Take 1 tablet (4 mg total) by mouth daily. 90 tablet 3  . RESTASIS 0.05 % ophthalmic emulsion as directed.    . traMADol (ULTRAM) 50 MG tablet Take 50 mg by mouth every 6 (six) hours as needed (for pain).     . eszopiclone (LUNESTA) 1 MG TABS tablet Take 1 tablet (1 mg total) by mouth at bedtime as needed for sleep. Take immediately before bedtime 30 tablet 1  . triamcinolone cream (KENALOG) 0.1 % Apply 1 application topically 2 (two) times daily. 30 g 0   No facility-administered medications prior to visit.    Allergies  Allergen Reactions  . Avelox [Moxifloxacin Hcl In Nacl] Shortness Of Breath  . Mucinex [Guaifenesin Er] Other (See Comments)    "Makes my heart flutter"  . Other     Other reaction(s): Unknown  . Sulfa Antibiotics Hives  . Cefdinir Rash    ROS Review of Systems  Constitutional: Negative for appetite change, fatigue and unexpected weight change.  Eyes: Negative for visual disturbance.  Respiratory: Negative for cough, chest tightness, shortness of breath and wheezing.   Cardiovascular: Negative for chest pain, palpitations and leg swelling.  Endocrine: Negative for polydipsia and polyuria.  Neurological: Negative for dizziness, seizures, syncope, weakness, light-headedness and headaches.  Psychiatric/Behavioral: Positive for sleep disturbance.      Objective:    Physical Exam Constitutional:      Appearance: She is well-developed and well-nourished.  Eyes:     Pupils: Pupils are equal, round, and reactive to light.  Neck:     Thyroid: No thyromegaly.     Vascular: No JVD.  Cardiovascular:      Rate and Rhythm: Normal rate and regular rhythm.     Heart sounds: No gallop.   Pulmonary:     Effort: Pulmonary effort is normal. No respiratory distress.     Breath sounds: Normal breath sounds. No wheezing or rales.  Musculoskeletal:        General: No edema.     Cervical back: Neck supple.  Neurological:     General: No focal deficit present.  Mental Status: She is alert.     BP 128/70   Pulse 68   Ht 5\' 4"  (1.626 m)   Wt 156 lb (70.8 kg)   SpO2 98%   BMI 26.78 kg/m  Wt Readings from Last 3 Encounters:  01/25/20 156 lb (70.8 kg)  12/24/19 151 lb 1.6 oz (68.5 kg)  10/23/19 152 lb (68.9 kg)     Health Maintenance Due  Topic Date Due  . Hepatitis C Screening  Never done  . DEXA SCAN  Never done  . TETANUS/TDAP  01/21/2008    There are no preventive care reminders to display for this patient.  Lab Results  Component Value Date   TSH 3.18 08/31/2019   Lab Results  Component Value Date   WBC 6.9 08/05/2016   HGB 10.9 (L) 08/05/2016   HCT 33.9 (L) 08/05/2016   MCV 89.7 08/05/2016   PLT 180 08/05/2016   Lab Results  Component Value Date   NA 136 08/31/2019   K 4.7 08/31/2019   CO2 24 08/31/2019   GLUCOSE 113 (H) 08/31/2019   BUN 20 08/31/2019   CREATININE 1.11 (H) 08/31/2019   BILITOT 0.4 12/24/2019   ALKPHOS 71 07/19/2018   AST 17 12/24/2019   ALT 16 12/24/2019   PROT 7.6 12/24/2019   ALBUMIN 4.5 07/19/2018   CALCIUM 9.9 08/31/2019   ANIONGAP 10 08/05/2016   GFR 44.91 (L) 09/06/2018   Lab Results  Component Value Date   CHOL 335 (H) 12/24/2019   Lab Results  Component Value Date   HDL 63 12/24/2019   Lab Results  Component Value Date   LDLCALC 237 (H) 12/24/2019   Lab Results  Component Value Date   TRIG 176 (H) 12/24/2019   Lab Results  Component Value Date   CHOLHDL 5.3 (H) 12/24/2019   Lab Results  Component Value Date   HGBA1C 6.5 (H) 08/31/2019      Assessment & Plan:   #1 chronic insomnia.  She states she has had  years of sleep difficulties.  Not improved with over-the-counter medications and did not have much response with Lunesta. -Continue to stress good sleep hygiene and factors reviewed -Discontinue Lunesta -Start trazodone 50 mg nightly.  May titrate up to 100 mg after several nights if not seeing results of 50 mg.  If she does not see results at 100 mg to be in touch  #2 hypertension stable and at goal -Continue amlodipine which she is currently taking 5 mg twice daily and benazepril 20 mg daily  #3 history of severe dyslipidemia -Recheck lipid panel.  She is currently on Livalo 4 mg daily   Meds ordered this encounter  Medications  . traZODone (DESYREL) 50 MG tablet    Sig: Take 0.5-1 tablets (25-50 mg total) by mouth at bedtime as needed for sleep.    Dispense:  30 tablet    Refill:  11  . triamcinolone (KENALOG) 0.1 %    Sig: Apply 1 application topically 2 (two) times daily.    Dispense:  80 g    Refill:  1    Follow-up: No follow-ups on file.    Carolann Littler, MD

## 2020-01-28 ENCOUNTER — Telehealth: Payer: Self-pay

## 2020-01-28 NOTE — Telephone Encounter (Signed)
-----   Message from Eulas Post, MD sent at 01/26/2020  3:54 PM EST ----- Lipids greatly improved.  Continue with Livalo 4mg  daily.

## 2020-01-28 NOTE — Telephone Encounter (Signed)
Patient aware of results and recommendations. °

## 2020-02-21 ENCOUNTER — Telehealth: Payer: Self-pay | Admitting: Family Medicine

## 2020-02-21 NOTE — Telephone Encounter (Signed)
Patient is returning a call to the office--wants a call back.

## 2020-02-21 NOTE — Progress Notes (Signed)
  Chronic Care Management   Outreach Note  02/21/2020 Name: MAKAIYAH SCHWEIGER MRN: [id number] DOB: March 13, 1940  Referred by: Eulas Post, MD Reason for referral : No chief complaint on file.   An unsuccessful telephone outreach was attempted today. The patient was referred to the pharmacist for assistance with care management and care coordination.   Follow Up Plan:   Carley Perdue [employer] Scheduler

## 2020-03-06 ENCOUNTER — Telehealth: Payer: Self-pay | Admitting: Family Medicine

## 2020-03-06 NOTE — Progress Notes (Signed)
  Chronic Care Management   Note  03/06/2020 Name: [name] MRN: [id number] DOB: 06/07/1940  Madison Hernandez is a 80 y.o. year old female who is a primary care patient of Burchette, Alinda Sierras, MD. I reached out to Madison Hernandez by phone today in response to a referral sent by Ms. Kilie A Troost's PCP, Burchette, Alinda Sierras, MD.   [name] was given information about Chronic Care Management services today including:  1. CCM service includes personalized support from designated clinical staff supervised by her physician, including individualized plan of care and coordination with other care providers 2. 24/7 contact phone numbers for assistance for urgent and routine care needs. 3. Service will only be billed when office clinical staff spend 20 minutes or more in a month to coordinate care. 4. Only one practitioner may furnish and bill the service in a calendar month. 5. The patient may stop CCM services at any time (effective at the end of the month) by phone call to the office staff.   Patient did not agree to enrollment in care management services and does not wish to consider at this time.  Follow up plan:   Carley Perdue [employer] Scheduler

## 2020-03-06 NOTE — Progress Notes (Signed)
  Chronic Care Management   Outreach Note  03/06/2020 Name: Madison Hernandez MRN: [id number] DOB: 1940-06-23  Referred by: Eulas Post, MD Reason for referral : No chief complaint on file.   A second unsuccessful telephone outreach was attempted today. The patient was referred to pharmacist for assistance with care management and care coordination.  Follow Up Plan:   Carley Perdue [employer] Scheduler

## 2020-03-14 ENCOUNTER — Ambulatory Visit (INDEPENDENT_AMBULATORY_CARE_PROVIDER_SITE_OTHER): Payer: Medicare Other | Admitting: Family Medicine

## 2020-03-14 ENCOUNTER — Other Ambulatory Visit: Payer: Self-pay

## 2020-03-14 ENCOUNTER — Encounter: Payer: Self-pay | Admitting: Family Medicine

## 2020-03-14 ENCOUNTER — Telehealth: Payer: Self-pay | Admitting: Family Medicine

## 2020-03-14 VITALS — BP 148/80 | HR 73 | Ht 64.0 in | Wt 156.0 lb

## 2020-03-14 DIAGNOSIS — R600 Localized edema: Secondary | ICD-10-CM | POA: Diagnosis not present

## 2020-03-14 DIAGNOSIS — I10 Essential (primary) hypertension: Secondary | ICD-10-CM | POA: Diagnosis not present

## 2020-03-14 DIAGNOSIS — F5104 Psychophysiologic insomnia: Secondary | ICD-10-CM

## 2020-03-14 MED ORDER — FUROSEMIDE 20 MG PO TABS: ORAL_TABLET | ORAL | 3 refills | Status: DC

## 2020-03-14 NOTE — Telephone Encounter (Signed)
Furosemide rx sent to local and mail order.  Patient aware.

## 2020-03-14 NOTE — Telephone Encounter (Signed)
Pt is calling in stating that she called the pharmacy and they do not have it and would like to see if the Rx furosemide (LASIX) 20 MG  Pharm:  [facility]  Pt stated that she is out and would like to have some to go to her local pharmacy [facility].   --CHANGE IN PHARMACY--please remove [facility] from her chart.

## 2020-03-14 NOTE — Patient Instructions (Signed)

## 2020-03-14 NOTE — Progress Notes (Signed)
Established Patient Office Visit  Subjective:  Patient ID: Madison Hernandez, female    DOB: Jul 02, 1940  Age: 80 y.o. MRN: [id number]  CC:  Chief Complaint  Patient presents with  . Foot Swelling    HPI Madison Hernandez presents for some bilateral foot edema and lower leg edema.  Worse late in the day.  Worse with dependency.  She has history of hypertension and is currently on regimen of amlodipine 10 mg daily and benazepril 20 mg daily.  She is taking furosemide 20 mg daily as needed for edema.  She states she is no longer taking gabapentin.  Denies any dyspnea.  She stays fairly active.  No recent dietary changes..  Longstanding history of insomnia.  She is on trazodone and currently taking 50 mg at night which seems to be working fairly well.  She did not respond well with Lunesta.  She has history of severe dyslipidemia.  She is currently on Livalo 4 mg daily and denies any side effects.  Recent lipids were much improved.  Past Medical History:  Diagnosis Date  . Acute ischemic colitis ([place]) 1/12  . Arthritis   . Bleeding ulcer    22 years ago  . Blood in stool   . Cardiac arrhythmia due to congenital heart disease   . Depression   . Diverticulitis   . Dyslipidemia   . Headache(784.0)   . Hyperlipidemia   . Hypertension   . Migraine   . UTI (urinary tract infection)     Past Surgical History:  Procedure Laterality Date  . bleeding ulcers     22 years ago  . vein procedures     right and left lower extremity vein procedures    Family History  Problem Relation Age of Onset  . Heart disease Mother        age 21  . Diabetes Mother   . Hypertension Mother   . Hypertension Sister     Social History   Socioeconomic History  . Marital status: Single    Spouse name: Not on file  . Number of children: Not on file  . Years of education: Not on file  . Highest education level: Not on file  Occupational History  . Not on file  Tobacco Use  . Smoking status:  Former Smoker    Packs/day: 2.00    Years: 10.00    Pack years: 20.00    Types: Cigarettes    Quit date: 01/19/1983    Years since quitting: 37.1  . Smokeless tobacco: Never Used  Vaping Use  . Vaping Use: Never used  Substance and Sexual Activity  . Alcohol use: No  . Drug use: No  . Sexual activity: Not on file  Other Topics Concern  . Not on file  Social History Narrative   She retired early. She is non smoker. Never knew her father. Her mother died age 42 of heart disease. Her half sister [name]   Social Determinants of Health   Financial Resource Strain: Not on file  Food Insecurity: Not on file  Transportation Needs: Not on file  Physical Activity: Not on file  Stress: Not on file  Social Connections: Not on file  Intimate Partner Violence: Not on file    Outpatient Medications Prior to Visit  Medication Sig Dispense Refill  . amLODipine (NORVASC) 10 MG tablet TAKE ONE-HALF TABLET BY  MOUTH TWICE DAILY 90 tablet 3  . benazepril (LOTENSIN) 20 MG tablet Take 1 tablet (  20 mg total) by mouth daily. 90 tablet 3  . furosemide (LASIX) 20 MG tablet Take one tablet by mouth daily as needed for leg edema 90 tablet 3  . gabapentin (NEURONTIN) 100 MG capsule Take 100 mg by mouth 3 (three) times daily.    Marland Kitchen ketoconazole (NIZORAL) 2 % cream Apply 1 application topically daily. 15 g 0  . levothyroxine (SYNTHROID) 25 MCG tablet Take 1 tablet (25 mcg total) by mouth daily before breakfast. 90 tablet 3  . LINZESS 145 MCG CAPS capsule TAKE 1 CAPSULE BY MOUTH  DAILY BEFORE BREAKFAST  NEEDS OFFICE VISIT FOR  FURTHER REFILLS 90 capsule 3  . metroNIDAZOLE (METROCREAM) 0.75 % cream Apply topically 2 (two) times daily. 45 g 0  . pantoprazole (PROTONIX) 20 MG tablet TAKE 1 TABLET BY MOUTH  DAILY 90 tablet 3  . Pitavastatin Calcium (LIVALO) 4 MG TABS Take 1 tablet (4 mg total) by mouth daily. 90 tablet 3  . RESTASIS 0.05 % ophthalmic emulsion as directed.    . traZODone (DESYREL) 50 MG  tablet Take 0.5-1 tablets (25-50 mg total) by mouth at bedtime as needed for sleep. 30 tablet 11  . triamcinolone (KENALOG) 0.1 % Apply 1 application topically 2 (two) times daily. 80 g 1  . ezetimibe (ZETIA) 10 MG tablet TAKE 1 TABLET BY MOUTH  DAILY 90 tablet 3  . traMADol (ULTRAM) 50 MG tablet Take 50 mg by mouth every 6 (six) hours as needed (for pain).      No facility-administered medications prior to visit.    Allergies  Allergen Reactions  . Avelox [Moxifloxacin Hcl In Nacl] Shortness Of Breath  . Mucinex [Guaifenesin Er] Other (See Comments)    "Makes my heart flutter"  . Other     Other reaction(s): Unknown  . Sulfa Antibiotics Hives  . Cefdinir Rash    ROS Review of Systems  Constitutional: Negative for fatigue.  Eyes: Negative for visual disturbance.  Respiratory: Negative for cough, chest tightness, shortness of breath and wheezing.   Cardiovascular: Positive for leg swelling. Negative for chest pain and palpitations.  Neurological: Negative for dizziness, seizures, syncope, weakness, light-headedness and headaches.      Objective:    Physical Exam Constitutional:      Appearance: She is well-developed and well-nourished.  Eyes:     Pupils: Pupils are equal, round, and reactive to light.  Neck:     Thyroid: No thyromegaly.     Vascular: No JVD.  Cardiovascular:     Rate and Rhythm: Normal rate and regular rhythm.     Heart sounds: No gallop.   Pulmonary:     Effort: Pulmonary effort is normal. No respiratory distress.     Breath sounds: Normal breath sounds. No wheezing or rales.  Musculoskeletal:        General: No edema.     Cervical back: Neck supple.  Neurological:     Mental Status: She is alert.     BP (!) 148/80   Pulse 73   Ht 5\' 4"  (1.626 m)   Wt 156 lb (70.8 kg)   SpO2 99%   BMI 26.78 kg/m  Wt Readings from Last 3 Encounters:  03/14/20 156 lb (70.8 kg)  01/25/20 156 lb (70.8 kg)  12/24/19 151 lb 1.6 oz (68.5 kg)     Health  Maintenance Due  Topic Date Due  . Hepatitis C Screening  Never done  . DEXA SCAN  Never done  . TETANUS/TDAP  01/21/2008  There are no preventive care reminders to display for this patient.  Lab Results  Component Value Date   TSH 3.18 08/31/2019   Lab Results  Component Value Date   WBC 6.9 08/05/2016   HGB 10.9 (L) 08/05/2016   HCT 33.9 (L) 08/05/2016   MCV 89.7 08/05/2016   PLT 180 08/05/2016   Lab Results  Component Value Date   NA 136 08/31/2019   K 4.7 08/31/2019   CO2 24 08/31/2019   GLUCOSE 113 (H) 08/31/2019   BUN 20 08/31/2019   CREATININE 1.11 (H) 08/31/2019   BILITOT 0.4 12/24/2019   ALKPHOS 71 07/19/2018   AST 17 12/24/2019   ALT 16 12/24/2019   PROT 7.6 12/24/2019   ALBUMIN 4.5 07/19/2018   CALCIUM 9.9 08/31/2019   ANIONGAP 10 08/05/2016   GFR 44.91 (L) 09/06/2018   Lab Results  Component Value Date   CHOL 222 (H) 01/25/2020   Lab Results  Component Value Date   HDL 65.80 01/25/2020   Lab Results  Component Value Date   LDLCALC 127 (H) 01/25/2020   Lab Results  Component Value Date   TRIG 147.0 01/25/2020   Lab Results  Component Value Date   CHOLHDL 3 01/25/2020   Lab Results  Component Value Date   HGBA1C 6.5 (H) 08/31/2019      Assessment & Plan:   #1 bilateral foot edema.  Relatively mild.  She has only trace edema at this time.  Likely related to amlodipine.  Recent albumin normal. -Elevate legs frequently -We discussed possible trial of compression socks -We recommend observation unless edema becomes more severe -We explained this is at least partly related likely to the amlodipine.  If edema comes more bothersome could look at other options.  #2 hyperlipidemia much improved on Livalo 4 mg daily -Continue current regimen of Livalo 4 mg daily  #3 chronic insomnia stable on trazodone 50 mg nightly  No orders of the defined types were placed in this encounter.   Follow-up: Return in about 3 months (around 06/11/2020).     Carolann Littler, MD

## 2020-04-21 DIAGNOSIS — Z79899 Other long term (current) drug therapy: Secondary | ICD-10-CM | POA: Diagnosis not present

## 2020-04-21 DIAGNOSIS — I10 Essential (primary) hypertension: Secondary | ICD-10-CM | POA: Diagnosis not present

## 2020-04-21 DIAGNOSIS — M791 Myalgia, unspecified site: Secondary | ICD-10-CM | POA: Diagnosis not present

## 2020-04-21 DIAGNOSIS — M546 Pain in thoracic spine: Secondary | ICD-10-CM | POA: Diagnosis not present

## 2020-04-21 DIAGNOSIS — E1140 Type 2 diabetes mellitus with diabetic neuropathy, unspecified: Secondary | ICD-10-CM | POA: Diagnosis not present

## 2020-04-21 DIAGNOSIS — M19019 Primary osteoarthritis, unspecified shoulder: Secondary | ICD-10-CM | POA: Diagnosis not present

## 2020-04-21 DIAGNOSIS — M7542 Impingement syndrome of left shoulder: Secondary | ICD-10-CM | POA: Diagnosis not present

## 2020-05-19 ENCOUNTER — Encounter: Payer: Self-pay | Admitting: Family Medicine

## 2020-05-19 ENCOUNTER — Ambulatory Visit (INDEPENDENT_AMBULATORY_CARE_PROVIDER_SITE_OTHER): Payer: Medicare Other | Admitting: Family Medicine

## 2020-05-19 ENCOUNTER — Other Ambulatory Visit: Payer: Self-pay

## 2020-05-19 VITALS — BP 132/58 | HR 78 | Temp 98.1°F | Wt 160.2 lb

## 2020-05-19 DIAGNOSIS — M545 Low back pain, unspecified: Secondary | ICD-10-CM | POA: Diagnosis not present

## 2020-05-19 DIAGNOSIS — R34 Anuria and oliguria: Secondary | ICD-10-CM | POA: Diagnosis not present

## 2020-05-19 LAB — POCT URINALYSIS DIPSTICK
Bilirubin, UA: NEGATIVE
Blood, UA: NEGATIVE
Glucose, UA: NEGATIVE
Ketones, UA: NEGATIVE
Leukocytes, UA: NEGATIVE
Nitrite, UA: NEGATIVE
Protein, UA: NEGATIVE
Spec Grav, UA: 1.01 (ref 1.010–1.025)
Urobilinogen, UA: NEGATIVE E.U./dL — AB
pH, UA: 6 (ref 5.0–8.0)

## 2020-05-19 NOTE — Progress Notes (Signed)
Subjective:    Patient ID: Madison Hernandez, female    DOB: Jan 17, 1941, 80 y.o.   MRN: [id number]  Chief Complaint  Patient presents with  . Back Pain    Burning lower back pain causing trouble urinating since yesterday.  Try to increase water intake.    HPI Patient is a 80 yo female with pmh sig for history of depression, renal calculi, diverticulitis, HLD, HTN, migraines, arthritis who is followed by Dr. Elease Hashimoto and seen for acute concern.  Patient endorses right-sided back pain x2 days.  Patient noticed of burning sensation in back with urination.  Also notes trouble urinating/decreased urine output.  Patient states she drinks 5 sodas per day and no water.  Tried to drink water within the last day which seems to have helped symptoms.  Patient endorses issues with constipation for which she takes Linzess.  Patient denies LE weakness, dysuria, nausea, vomiting, fever, chills, hematuria, pain that moves.  Past Medical History:  Diagnosis Date  . Acute ischemic colitis ([place]) 1/12  . Arthritis   . Bleeding ulcer    22 years ago  . Blood in stool   . Cardiac arrhythmia due to congenital heart disease   . Depression   . Diverticulitis   . Dyslipidemia   . Headache(784.0)   . Hyperlipidemia   . Hypertension   . Migraine   . UTI (urinary tract infection)     Allergies  Allergen Reactions  . Avelox [Moxifloxacin Hcl In Nacl] Shortness Of Breath  . Mucinex [Guaifenesin Er] Other (See Comments)    "Makes my heart flutter"  . Other     Other reaction(s): Unknown  . Sulfa Antibiotics Hives  . Cefdinir Rash   Family History  Problem Relation Age of Onset  . Heart disease Mother        age 67  . Diabetes Mother   . Hypertension Mother   . Hypertension Sister     ROS General: Denies fever, chills, night sweats, changes in weight, changes in appetite HEENT: Denies headaches, ear pain, changes in vision, rhinorrhea, sore throat CV: Denies CP, palpitations, SOB, orthopnea Pulm:  Denies SOB, cough, wheezing GI: Denies abdominal pain, nausea, vomiting, diarrhea, constipation GU: Denies dysuria, hematuria, frequency, vaginal discharge + difficulty with urination Msk: Denies muscle cramps, joint pains  + right sided low back pain Neuro: Denies weakness, numbness, tingling Skin: Denies rashes, bruising Psych: Denies depression, anxiety, hallucinations     Objective:    Blood pressure (!) 132/58, pulse 78, temperature 98.1 F (36.7 C), temperature source Oral, weight 160 lb 3.2 oz (72.7 kg), SpO2 96 %.  Gen. Pleasant, well-nourished, in no distress, normal affect   HEENT: [place]/AT, face symmetric, conjunctiva clear, no scleral icterus, PERRLA, EOMI, nares patent without drainage Lungs: no accessory muscle use, CTAB, no wheezes or rales Cardiovascular: RRR, no m/r/g, no peripheral edema Abdomen: BS present but decreased, soft, NT/ND, no hepatosplenomegaly.  No CVA tenderness Musculoskeletal: No deformities, no cyanosis or clubbing, normal tone Neuro:  A&Ox3, CN II-XII intact, normal gait Skin:  Warm, no lesions/ rash   Wt Readings from Last 3 Encounters:  05/19/20 160 lb 3.2 oz (72.7 kg)  03/14/20 156 lb (70.8 kg)  01/25/20 156 lb (70.8 kg)    Lab Results  Component Value Date   WBC 6.9 08/05/2016   HGB 10.9 (L) 08/05/2016   HCT 33.9 (L) 08/05/2016   PLT 180 08/05/2016   GLUCOSE 113 (H) 08/31/2019   CHOL 222 (H) 01/25/2020  TRIG 147.0 01/25/2020   HDL 65.80 01/25/2020   LDLDIRECT 334.0 07/12/2016   LDLCALC 127 (H) 01/25/2020   ALT 16 12/24/2019   AST 17 12/24/2019   NA 136 08/31/2019   K 4.7 08/31/2019   CL 102 08/31/2019   CREATININE 1.11 (H) 08/31/2019   BUN 20 08/31/2019   CO2 24 08/31/2019   TSH 3.18 08/31/2019   INR 1.02 01/22/2010   HGBA1C 6.5 (H) 08/31/2019    Assessment/Plan:  Decreased urine output -Likely 2/2 dehydration -Advised to cut down on soda intake -Patient encouraged to increase p.o. intake of water and fluids -UA  normal.  SG 1.010  - Plan: CBC with Differential/Platelet  Acute right-sided low back pain without sciatica  -Discussed possible causes including UTI, renal calculi, CKD.  Less likely MSK cause. -Discussed increasing p.o. intake of water and fluids -We will obtain CMP -UA normal. -Further recommendations based on imaging. -For worsening or continued symptoms consider renal stone study. - Plan: POCT urinalysis dipstick, Comprehensive metabolic panel, Calcium, ionized, CBC with Differential/Platelet  F/u as needed  Grier Mitts, MD

## 2020-05-19 NOTE — Patient Instructions (Signed)
Preventing Chronic Kidney Disease Chronic kidney disease (CKD) occurs when the kidneys are slowly and permanently damaged over a long period of time. The kidneys are two organs that do many important jobs in the body, including:  Removing waste and extra fluid from the blood to make urine.  Making hormones that maintain the amount of fluid in tissues and blood vessels.  Maintaining the right amount of fluids and electrolytes in the body. A small amount of kidney damage may not cause problems, but a large amount of damage may make it hard or impossible for the kidneys to work the way they should. CKD gets worse over time. You can take steps to prevent CKD or to keep it from getting worse. The best way to prevent kidney damage is to know your risk factors and make changes before you develop symptoms of CKD. How can this condition affect me? At first, you may not notice any signs or symptoms of CKD. Symptoms develop slowly and may not be obvious until the kidney damage becomes severe. If steps are not taken to prevent or slow down the disease, CKD can lead to:  A low red blood cell count (anemia).  Heart disease.  Weak bones.  Nerve damage.  Stroke.  Kidney failure and dialysis.  Changes in urination, such as less urine, more urine, or blood in the urine. What can increase my risk? You are more likely to develop CKD if you:  Are 78 years of age or older.  Are obese.  Have taken certain medicines for a long time.  Use tobacco or have used it in the past.  Have any of the following conditions: ? Diabetes. ? High blood pressure. ? Heart disease. ? Multiple myeloma. ? An autoimmune disease. ? Frequent urinary tract infections. ? Polycystic kidney disease. ? High cholesterol.  Have a family history of kidney disease, heart disease, diabetes, or high blood pressure.  Have problems with urine flow that may be caused by: ? Cancer. ? Having kidney stones more than once. ? An  enlarged prostate in males. What actions can I take to prevent CKD? Managing conditions that put you at risk  Talk to your health care provider about your kidney health and your risk factors for CKD.  Work with your health care provider to manage conditions such as high blood pressure, diabetes, or high cholesterol. This may involve taking medicines, eating healthy, or making lifestyle changes to help get the following measures down to the target that your health care provider recommends: ? Blood pressure. ? Blood sugar (glucose) levels. ? Cholesterol. Eating and drinking  Follow instructions from your health care provider about diet. This may include: ? Limiting salt (sodium) intake. You should have less than 1 tsp (2,300 mg) of sodium a day. If you have heart disease or high blood pressure, you should have less than  tsp (1,725 mg) of sodium a day. ? Limiting protein intake as told by your health care provider. Avoid high-protein foods. ? Eating a balanced, heart-healthy diet. ? Avoiding foods that are high in potassium and phosphorous.  Limit alcohol. If you drink alcohol: ? Limit how much you use to:  0-1 drink a day for women who are not pregnant.  0-2 drinks a day for men. ? Know how much alcohol is in your drink. In the [place] one drink equals one 12 oz bottle of beer (355 mL), one 5 oz glass of wine (148 mL), or one 1 oz glass of hard liquor (  44 mL).  If you have diabetes, work with a Financial planner (Firefighter) or a certified diabetes educator to develop a healthy eating plan.  Talk with your health care provider about how much fluid you should drink each day.   Lifestyle  Exercise for at least 30 minutes on 5 or more days of the week, or as much as told by your health care provider.  Keep your weight at a healthy level. If you are overweight or obese, lose weight as told by your health care provider.  Do not use any products that contain nicotine or  tobacco, such as cigarettes, e-cigarettes, and chewing tobacco. If you need help quitting, ask your health care provider.   General instructions  Take over-the-counter and prescription medicines only as told by your health care provider. Do not take any new medicines unless approved by your health care provider.  Use NSAIDs, such as ibuprofen, for pain only when necessary. Ask your health care provider about other pain medicines that do not increase your risk of developing CKD.  Have a yearly physical exam.  Learn about your family's medical history. Talk to your relatives and siblings about diabetes, heart disease, and high blood pressure. Where to find more information Learn more about CKD and how to prevent CKD from:  [place]: www.kidney.org  American Association of Kidney Patients: BombTimer.gl  American Diabetes Association: www.diabetes.org Summary  Symptoms of CKD develop slowly and may not be obvious until the kidney damage becomes severe.  The best way to prevent kidney damage is to know your risk factors and make nutrition and lifestyle changes before you develop symptoms of CKD.  Follow instructions from your health care provider about diet, which may include limiting how much salt, protein, and alcohol you consume.  Work with your health care provider to keep your blood pressure, cholesterol, and blood sugar levels within the recommended range. This information is not intended to replace advice given to you by your health care provider. Make sure you discuss any questions you have with your health care provider. Document Revised: 05/03/2019 Document Reviewed: 04/06/2019 Elsevier Patient Education  [place].

## 2020-05-20 LAB — COMPREHENSIVE METABOLIC PANEL
ALT: 12 U/L (ref 0–35)
AST: 14 U/L (ref 0–37)
Albumin: 4.4 g/dL (ref 3.5–5.2)
Alkaline Phosphatase: 66 U/L (ref 39–117)
BUN: 22 mg/dL (ref 6–23)
CO2: 23 mEq/L (ref 19–32)
Calcium: 9.4 mg/dL (ref 8.4–10.5)
Chloride: 98 mEq/L (ref 96–112)
Creatinine, Ser: 1.2 mg/dL (ref 0.40–1.20)
GFR: 43.45 mL/min — ABNORMAL LOW (ref >60.00)
Glucose, Bld: 100 mg/dL — ABNORMAL HIGH (ref 70–99)
Potassium: 4.6 mEq/L (ref 3.5–5.1)
Sodium: 130 mEq/L — ABNORMAL LOW (ref 135–145)
Total Bilirubin: 0.4 mg/dL (ref 0.2–1.2)
Total Protein: 6.8 g/dL (ref 6.0–8.3)

## 2020-05-20 LAB — CBC WITH DIFFERENTIAL/PLATELET
Basophils Absolute: 0.1 10*3/uL (ref 0.0–0.1)
Basophils Relative: 1.3 % (ref 0.0–3.0)
Eosinophils Absolute: 0.4 10*3/uL (ref 0.0–0.7)
Eosinophils Relative: 5.1 % — ABNORMAL HIGH (ref 0.0–5.0)
HCT: 33.4 % — ABNORMAL LOW (ref 36.0–46.0)
Hemoglobin: 11.3 g/dL — ABNORMAL LOW (ref 12.0–15.0)
Lymphocytes Relative: 31.7 % (ref 12.0–46.0)
Lymphs Abs: 2.5 10*3/uL (ref 0.7–4.0)
MCHC: 33.7 g/dL (ref 30.0–36.0)
MCV: 89.9 fl (ref 78.0–100.0)
Monocytes Absolute: 0.6 10*3/uL (ref 0.1–1.0)
Monocytes Relative: 7 % (ref 3.0–12.0)
Neutro Abs: 4.3 10*3/uL (ref 1.4–7.7)
Neutrophils Relative %: 54.9 % (ref 43.0–77.0)
Platelets: 226 10*3/uL (ref 150.0–400.0)
RBC: 3.71 Mil/uL — ABNORMAL LOW (ref 3.87–5.11)
RDW: 13.7 % (ref 11.5–15.5)
WBC: 7.8 10*3/uL (ref 4.0–10.5)

## 2020-05-20 LAB — URINE CULTURE
MICRO NUMBER:: 11838037
Result:: NO GROWTH
SPECIMEN QUALITY:: ADEQUATE

## 2020-05-20 LAB — CALCIUM, IONIZED: Calcium, Ion: 5.17 mg/dL (ref 4.8–5.6)

## 2020-05-21 NOTE — Progress Notes (Signed)
Spoke with patient, is aware. 

## 2020-05-22 ENCOUNTER — Other Ambulatory Visit: Payer: Self-pay

## 2020-05-22 ENCOUNTER — Encounter: Payer: Self-pay | Admitting: Family Medicine

## 2020-05-22 ENCOUNTER — Ambulatory Visit (INDEPENDENT_AMBULATORY_CARE_PROVIDER_SITE_OTHER): Payer: Medicare Other | Admitting: Family Medicine

## 2020-05-22 VITALS — BP 138/76 | HR 98 | Temp 97.8°F | Wt 155.4 lb

## 2020-05-22 DIAGNOSIS — E871 Hypo-osmolality and hyponatremia: Secondary | ICD-10-CM | POA: Diagnosis not present

## 2020-05-22 DIAGNOSIS — R42 Dizziness and giddiness: Secondary | ICD-10-CM

## 2020-05-22 LAB — POCT URINALYSIS DIPSTICK
Bilirubin, UA: NEGATIVE
Blood, UA: NEGATIVE
Glucose, UA: NEGATIVE
Ketones, UA: NEGATIVE
Leukocytes, UA: NEGATIVE
Nitrite, UA: NEGATIVE
Protein, UA: NEGATIVE
Spec Grav, UA: 1.015 (ref 1.010–1.025)
Urobilinogen, UA: NEGATIVE E.U./dL — AB
pH, UA: 6 (ref 5.0–8.0)

## 2020-05-22 MED ORDER — MECLIZINE HCL 12.5 MG PO TABS: 12.5000 mg | ORAL_TABLET | Freq: Three times a day (TID) | ORAL | 0 refills | Status: DC | PRN

## 2020-05-22 NOTE — Patient Instructions (Addendum)
Hyponatremia Hyponatremia is when the amount of salt (sodium) in your blood is too low. When sodium levels are low, your cells absorb extra water, which causes them to swell. The swelling happens throughout the body, but it mostly affects the brain. What are the causes? This condition may be caused by:  Certain medical conditions, such as: ? Heart, kidney, or liver problems. ? Thyroid problems. ? Adrenal gland problems. ? Metabolic conditions, such as Addison disease or syndrome of inappropriate antidiuretic hormone (SIADH).  Severe vomiting or diarrhea.  Certain medicines or illegal drugs.  Dehydration.  Drinking too much water.  Eating a diet that is low in sodium.  Large burns on your body.  Excessive sweating. What increases the risk? You are more likely to develop this condition if you:  Have long-term (chronic) kidney disease.  Have heart failure.  Have a medical condition that causes frequent or excessive diarrhea.  Participate in intense physical activities, such as marathon running.  Take certain medicines that affect the sodium and fluid balance in the blood. Some of these medicine types include: ? Diuretics. ? NSAIDs. ? Some opioid pain medicines. ? Some antidepressants. ? Some seizure prevention medicines. What are the signs or symptoms? Symptoms of this condition include:  Headache.  Nausea and vomiting.  Being very tired (lethargic).  Muscle weakness and cramping.  Loss of appetite.  Feeling weak or light-headed. Severe symptoms of this condition include:  Confusion.  Agitation.  Having a rapid heart rate.  Passing out (fainting).  Seizures.  Coma. How is this diagnosed? This condition is diagnosed based on:  A physical exam.  Your medical history.  Tests, including: ? Blood tests. ? Urine tests. How is this treated? Treatment for this condition depends on the cause. Treatment may include:  Getting fluids through an IV  that is inserted into one of your veins.  Medicines to correct the sodium imbalance. If medicines are causing the condition, the medicines will need to be adjusted.  Limiting your water or fluid intake to get the correct sodium balance.  Monitoring in the hospital setting to closely watch your symptoms for improvement.   Follow these instructions at home:  Take over-the-counter and prescription medicines only as told by your health care provider. Many medicines can make this condition worse. Talk with your health care provider about any medicines that you are currently taking.  Carefully follow a recommended diet as told by your health care provider.  Carefully follow instructions from your health care provider about fluid restrictions.  Do not drink alcohol.  Keep all follow-up visits as told by your health care provider. This is important.   Contact a health care provider if:  You develop worsening nausea, fatigue, headache, confusion, or weakness.  Your symptoms go away and then return.  You have problems following the recommended diet. Get help right away if:  You have a seizure.  You pass out.  You have ongoing diarrhea or vomiting. Summary  Hyponatremia is when the amount of salt (sodium) in your blood is too low.  When sodium levels are low, your cells absorb extra water, which causes them to swell.  The swelling happens throughout the body, but it mostly affects the brain.  Treatment for this condition depends on the cause. It may include IV fluids, medicines, and limiting your fluid intake. This information is not intended to replace advice given to you by your health care provider. Make sure you discuss any questions you have with your health  care provider. Document Revised: 11/18/2017 Document Reviewed: 11/18/2017 Elsevier Patient Education  2021 [employer].  Vertigo Vertigo is the feeling that you or your surroundings are moving when they are not. This  feeling can come and go at any time. Vertigo often goes away on its own. Vertigo can be dangerous if it occurs while you are doing something that could endanger you or others, such as driving or operating machinery. Your health care provider will do tests to determine the cause of your vertigo. Tests will also help your health care provider decide how best to treat your condition. Follow these instructions at home: Eating and drinking  Drink enough fluid to keep your urine pale yellow.  Do not drink alcohol.      Activity  Return to your normal activities as told by your health care provider. Ask your health care provider what activities are safe for you.  In the morning, first sit up on the side of the bed. When you feel okay, stand slowly while you hold onto something until you know that your balance is fine.  Move slowly. Avoid sudden body or head movements or certain positions, as told by your health care provider.  If you have trouble walking or keeping your balance, try using a cane for stability. If you feel dizzy or unstable, sit down right away.  Avoid doing any tasks that would cause danger to you or others if vertigo occurs.  Avoid bending down if you feel dizzy. Place items in your home so that they are easy for you to reach without leaning over.  Do not drive or use heavy machinery if you feel dizzy. General instructions  Take over-the-counter and prescription medicines only as told by your health care provider.  Keep all follow-up visits as told by your health care provider. This is important. Contact a health care provider if:  Your medicines do not relieve your vertigo or they make it worse.  You have a fever.  Your condition gets worse or you develop new symptoms.  Your family or friends notice any behavioral changes.  Your nausea or vomiting gets worse.  You have numbness or a prickling and tingling sensation in part of your body. Get help right away if  you:  Have difficulty moving or speaking.  Are always dizzy.  Faint.  Develop severe headaches.  Have weakness in your hands, arms, or legs.  Have changes in your hearing or vision.  Develop a stiff neck.  Develop sensitivity to light. Summary  Vertigo is the feeling that you or your surroundings are moving when they are not.  Your health care provider will do tests to determine the cause of your vertigo.  Follow instructions for home care. You may be told to avoid certain tasks, positions, or movements.  Contact a health care provider if your medicines do not relieve your symptoms, or if you have a fever, nausea, vomiting, or changes in behavior.  Get help right away if you have severe headaches or difficulty speaking, or you develop hearing or vision problems. This information is not intended to replace advice given to you by your health care provider. Make sure you discuss any questions you have with your health care provider. Document Revised: 11/28/2017 Document Reviewed: 11/28/2017 Elsevier Patient Education  2021 [employer].

## 2020-05-22 NOTE — Progress Notes (Signed)
Subjective:    Patient ID: Madison Hernandez, female    DOB: 1940/02/22, 80 y.o.   MRN: [id number]  Chief Complaint  Patient presents with  . Dizziness    States she has been dizzy since last appointment 5/02, today she felt nauseated and dizzy. Stated when she felt dizzy, she could not hold on to wall to walk.    HPI Patient was seen today for f/u.  Pt seen 05/19/20 by this provider.  Since OFV, pt notes waking up with dizziness 2 days ago.  Endoreses increased symptoms when flipping through the channels on tv or trying to walk down the hall.  Pt seen 05/19/20 in clinic for decreased urine output and low back pain.  Patient notes back pain has improved.  Now drinking more water.   Hyponatremia and anemia noted on labs.  Past Medical History:  Diagnosis Date  . Acute ischemic colitis ([place]) 1/12  . Arthritis   . Bleeding ulcer    22 years ago  . Blood in stool   . Cardiac arrhythmia due to congenital heart disease   . Depression   . Diverticulitis   . Dyslipidemia   . Headache(784.0)   . Hyperlipidemia   . Hypertension   . Migraine   . UTI (urinary tract infection)     Allergies  Allergen Reactions  . Avelox [Moxifloxacin Hcl In Nacl] Shortness Of Breath  . Mucinex [Guaifenesin Er] Other (See Comments)    "Makes my heart flutter"  . Other     Other reaction(s): Unknown  . Sulfa Antibiotics Hives  . Cefdinir Rash    ROS General: Denies fever, chills, night sweats, changes in weight, changes in appetite +dizziness HEENT: Denies headaches, ear pain, changes in vision, rhinorrhea, sore throat CV: Denies CP, palpitations, SOB, orthopnea Pulm: Denies SOB, cough, wheezing GI: Denies abdominal pain, vomiting, diarrhea, constipation  +Nausea GU: Denies dysuria, hematuria, frequency, vaginal discharge Msk: Denies muscle cramps, joint pains Neuro: Denies weakness, numbness, tingling Skin: Denies rashes, bruising Psych: Denies depression, anxiety, hallucinations     Objective:     Blood pressure 138/76, pulse 98, temperature 97.8 F (36.6 C), temperature source Oral, weight 155 lb 6.4 oz (70.5 kg), SpO2 97 %.  Gen. Pleasant, well-nourished, mild confusion, in no distress, normal affect  HEENT: [place]/AT, face symmetric, conjunctiva clear, no scleral icterus, PERRLA, EOMI, 2 beats of nystagmus with L sided gaze.Nares patent without drainage, pharynx without erythema or exudate.  Left TM normal.  Right TM absent, no drainage noted.  Epley's maneuver performed without success. Lungs: no accessory muscle use, CTAB, no wheezes or rales Cardiovascular: RRR, no m/r/g, no peripheral edema Abdomen: BS present, soft, NT/ND, no hepatosplenomegaly. Musculoskeletal: No deformities, no cyanosis or clubbing, normal tone Neuro:  A&Ox3, CN II-XII intact, normal gait Skin:  Warm, no lesions/ rash  Wt Readings from Last 3 Encounters:  05/22/20 155 lb 6.4 oz (70.5 kg)  05/19/20 160 lb 3.2 oz (72.7 kg)  03/14/20 156 lb (70.8 kg)    Lab Results  Component Value Date   WBC 7.8 05/19/2020   HGB 11.3 (L) 05/19/2020   HCT 33.4 (L) 05/19/2020   PLT 226.0 05/19/2020   GLUCOSE 100 (H) 05/19/2020   CHOL 222 (H) 01/25/2020   TRIG 147.0 01/25/2020   HDL 65.80 01/25/2020   LDLDIRECT 334.0 07/12/2016   LDLCALC 127 (H) 01/25/2020   ALT 12 05/19/2020   AST 14 05/19/2020   NA 130 (L) 05/19/2020   K 4.6 05/19/2020  CL 98 05/19/2020   CREATININE 1.20 05/19/2020   BUN 22 05/19/2020   CO2 23 05/19/2020   TSH 3.18 08/31/2019   INR 1.02 01/22/2010   HGBA1C 6.5 (H) 08/31/2019    Assessment/Plan:  Hyponatremia  -Na+ 130 on 05/19/20 -repeat BMP - Plan: Basic metabolic panel  Vertigo  -consent obtained.  Epley maneuver performed without success. -given handout -continue hydration -advised to take time when moving from lying down to sitting or to standing position. -for continued symptoms obtain CT or MRI of brain. - Plan: POCT urinalysis dipstick, meclizine (ANTIVERT) 12.5 MG tablet,  Basic metabolic panel, CBC with Differential/Platelet  F/u prn in the next few days for continued symptoms  Grier Mitts, MD

## 2020-05-23 LAB — BASIC METABOLIC PANEL
BUN: 19 mg/dL (ref 6–23)
CO2: 23 mEq/L (ref 19–32)
Calcium: 10 mg/dL (ref 8.4–10.5)
Chloride: 101 mEq/L (ref 96–112)
Creatinine, Ser: 1.14 mg/dL (ref 0.40–1.20)
GFR: 46.21 mL/min — ABNORMAL LOW (ref >60.00)
Glucose, Bld: 107 mg/dL — ABNORMAL HIGH (ref 70–99)
Potassium: 4.5 mEq/L (ref 3.5–5.1)
Sodium: 136 mEq/L (ref 135–145)

## 2020-05-23 LAB — CBC WITH DIFFERENTIAL/PLATELET
Basophils Absolute: 0.1 10*3/uL (ref 0.0–0.1)
Basophils Relative: 1.7 % (ref 0.0–3.0)
Eosinophils Absolute: 0.2 10*3/uL (ref 0.0–0.7)
Eosinophils Relative: 2.9 % (ref 0.0–5.0)
HCT: 36.4 % (ref 36.0–46.0)
Hemoglobin: 12.2 g/dL (ref 12.0–15.0)
Lymphocytes Relative: 24.2 % (ref 12.0–46.0)
Lymphs Abs: 2 10*3/uL (ref 0.7–4.0)
MCHC: 33.5 g/dL (ref 30.0–36.0)
MCV: 89.8 fl (ref 78.0–100.0)
Monocytes Absolute: 0.5 10*3/uL (ref 0.1–1.0)
Monocytes Relative: 6.2 % (ref 3.0–12.0)
Neutro Abs: 5.3 10*3/uL (ref 1.4–7.7)
Neutrophils Relative %: 65 % (ref 43.0–77.0)
Platelets: 258 10*3/uL (ref 150.0–400.0)
RBC: 4.06 Mil/uL (ref 3.87–5.11)
RDW: 13.3 % (ref 11.5–15.5)
WBC: 8.1 10*3/uL (ref 4.0–10.5)

## 2020-05-31 ENCOUNTER — Encounter: Payer: Self-pay | Admitting: Family Medicine

## 2020-06-17 DIAGNOSIS — I8311 Varicose veins of right lower extremity with inflammation: Secondary | ICD-10-CM | POA: Diagnosis not present

## 2020-06-17 DIAGNOSIS — I8312 Varicose veins of left lower extremity with inflammation: Secondary | ICD-10-CM | POA: Diagnosis not present

## 2020-06-17 DIAGNOSIS — R600 Localized edema: Secondary | ICD-10-CM | POA: Diagnosis not present

## 2020-06-18 DIAGNOSIS — I8311 Varicose veins of right lower extremity with inflammation: Secondary | ICD-10-CM | POA: Diagnosis not present

## 2020-06-18 DIAGNOSIS — I8312 Varicose veins of left lower extremity with inflammation: Secondary | ICD-10-CM | POA: Diagnosis not present

## 2020-06-20 DIAGNOSIS — I8312 Varicose veins of left lower extremity with inflammation: Secondary | ICD-10-CM | POA: Diagnosis not present

## 2020-06-20 DIAGNOSIS — I8311 Varicose veins of right lower extremity with inflammation: Secondary | ICD-10-CM | POA: Diagnosis not present

## 2020-06-24 DIAGNOSIS — L299 Pruritus, unspecified: Secondary | ICD-10-CM | POA: Diagnosis not present

## 2020-07-08 DIAGNOSIS — M5124 Other intervertebral disc displacement, thoracic region: Secondary | ICD-10-CM | POA: Diagnosis not present

## 2020-07-08 DIAGNOSIS — I10 Essential (primary) hypertension: Secondary | ICD-10-CM | POA: Diagnosis not present

## 2020-07-08 DIAGNOSIS — M19019 Primary osteoarthritis, unspecified shoulder: Secondary | ICD-10-CM | POA: Diagnosis not present

## 2020-07-08 DIAGNOSIS — M546 Pain in thoracic spine: Secondary | ICD-10-CM | POA: Diagnosis not present

## 2020-07-08 DIAGNOSIS — M7542 Impingement syndrome of left shoulder: Secondary | ICD-10-CM | POA: Diagnosis not present

## 2020-07-08 DIAGNOSIS — M791 Myalgia, unspecified site: Secondary | ICD-10-CM | POA: Diagnosis not present

## 2020-07-08 DIAGNOSIS — E1140 Type 2 diabetes mellitus with diabetic neuropathy, unspecified: Secondary | ICD-10-CM | POA: Diagnosis not present

## 2020-07-09 DIAGNOSIS — I8312 Varicose veins of left lower extremity with inflammation: Secondary | ICD-10-CM | POA: Diagnosis not present

## 2020-07-15 DIAGNOSIS — I8311 Varicose veins of right lower extremity with inflammation: Secondary | ICD-10-CM | POA: Diagnosis not present

## 2020-07-30 ENCOUNTER — Other Ambulatory Visit: Payer: Self-pay | Admitting: Family Medicine

## 2020-07-30 DIAGNOSIS — K219 Gastro-esophageal reflux disease without esophagitis: Secondary | ICD-10-CM

## 2020-08-01 ENCOUNTER — Other Ambulatory Visit: Payer: Self-pay | Admitting: Family Medicine

## 2020-08-05 DIAGNOSIS — I8312 Varicose veins of left lower extremity with inflammation: Secondary | ICD-10-CM | POA: Diagnosis not present

## 2020-08-14 ENCOUNTER — Other Ambulatory Visit: Payer: Self-pay | Admitting: Family Medicine

## 2020-08-19 DIAGNOSIS — M7981 Nontraumatic hematoma of soft tissue: Secondary | ICD-10-CM | POA: Diagnosis not present

## 2020-08-19 DIAGNOSIS — I8312 Varicose veins of left lower extremity with inflammation: Secondary | ICD-10-CM | POA: Diagnosis not present

## 2020-09-12 ENCOUNTER — Other Ambulatory Visit: Payer: Self-pay | Admitting: Family Medicine

## 2020-09-16 ENCOUNTER — Other Ambulatory Visit: Payer: Self-pay

## 2020-09-17 ENCOUNTER — Ambulatory Visit (INDEPENDENT_AMBULATORY_CARE_PROVIDER_SITE_OTHER): Payer: Medicare Other | Admitting: Family Medicine

## 2020-09-17 VITALS — BP 150/60 | HR 90 | Temp 97.8°F | Wt 154.7 lb

## 2020-09-17 DIAGNOSIS — I10 Essential (primary) hypertension: Secondary | ICD-10-CM | POA: Diagnosis not present

## 2020-09-17 DIAGNOSIS — E039 Hypothyroidism, unspecified: Secondary | ICD-10-CM | POA: Diagnosis not present

## 2020-09-17 DIAGNOSIS — N189 Chronic kidney disease, unspecified: Secondary | ICD-10-CM | POA: Diagnosis not present

## 2020-09-17 DIAGNOSIS — E782 Mixed hyperlipidemia: Secondary | ICD-10-CM | POA: Diagnosis not present

## 2020-09-17 DIAGNOSIS — Z23 Encounter for immunization: Secondary | ICD-10-CM

## 2020-09-17 DIAGNOSIS — R739 Hyperglycemia, unspecified: Secondary | ICD-10-CM

## 2020-09-17 LAB — HEPATIC FUNCTION PANEL
ALT: 18 U/L (ref 0–35)
AST: 19 U/L (ref 0–37)
Albumin: 4.4 g/dL (ref 3.5–5.2)
Alkaline Phosphatase: 74 U/L (ref 39–117)
Bilirubin, Direct: 0 mg/dL (ref 0.0–0.3)
Total Bilirubin: 0.5 mg/dL (ref 0.2–1.2)
Total Protein: 7.5 g/dL (ref 6.0–8.3)

## 2020-09-17 LAB — BASIC METABOLIC PANEL
BUN: 24 mg/dL — ABNORMAL HIGH (ref 6–23)
CO2: 26 mEq/L (ref 19–32)
Calcium: 10.5 mg/dL (ref 8.4–10.5)
Chloride: 103 mEq/L (ref 96–112)
Creatinine, Ser: 1.29 mg/dL — ABNORMAL HIGH (ref 0.40–1.20)
GFR: 39.75 mL/min — ABNORMAL LOW (ref >60.00)
Glucose, Bld: 127 mg/dL — ABNORMAL HIGH (ref 70–99)
Potassium: 5.3 mEq/L — ABNORMAL HIGH (ref 3.5–5.1)
Sodium: 139 mEq/L (ref 135–145)

## 2020-09-17 LAB — POCT GLYCOSYLATED HEMOGLOBIN (HGB A1C): Hemoglobin A1C: 6.4 % — AB (ref 4.0–5.6)

## 2020-09-17 LAB — LDL CHOLESTEROL, DIRECT: Direct LDL: 321 mg/dL

## 2020-09-17 LAB — LIPID PANEL
Cholesterol: 425 mg/dL — ABNORMAL HIGH (ref 0–200)
HDL: 59.4 mg/dL (ref >39.00)
NonHDL: 365.89
Total CHOL/HDL Ratio: 7
Triglycerides: 229 mg/dL — ABNORMAL HIGH (ref 0.0–149.0)
VLDL: 45.8 mg/dL — ABNORMAL HIGH (ref 0.0–40.0)

## 2020-09-17 LAB — TSH: TSH: 2.63 u[IU]/mL (ref 0.35–5.50)

## 2020-09-17 MED ORDER — CHLORTHALIDONE 25 MG PO TABS: 25.0000 mg | ORAL_TABLET | Freq: Every day | ORAL | 5 refills | Status: DC

## 2020-09-17 NOTE — Progress Notes (Signed)
Established Patient Office Visit  Subjective:  Patient ID: Madison Hernandez, female    DOB: February 15, 1940  Age: 80 y.o. MRN: [id number]  CC:  Chief Complaint  Patient presents with   Follow-up    Hyperglycemia at last labs,      HPI Madison Hernandez presents for medical follow-up  Requesting A1c.  She has been borderline diabetes in the past.  A1c is generally around 6.4-6.5.  Does not monitor sugars regularly.  No polyuria or polydipsia.  Currently does not take any medications for high blood sugar.  Does have strong family history of type 2 diabetes.  Generally tries to follow a low glycemic diet  History of hypertension treated with amlodipine and benazepril.  Blood pressure not controlled well today and she has had several elevated readings in looking back over the chart.  No alcohol use.  No regular nonsteroidal use.  Hypothyroidism.  She is on low-dose Synthroid.  Needs follow-up TSH.  History of severe lipidemia.  She has had intolerance with multiple statins.  We have started her on Livalo and she had no myalgias.  However, she had developed some daily headache and states this went away after stopping the medication.  She states she has been off this medication now for about 2 weeks.  Past Medical History:  Diagnosis Date   Acute ischemic colitis ([place]) 1/12   Arthritis    Bleeding ulcer    22 years ago   Blood in stool    Cardiac arrhythmia due to congenital heart disease    Depression    Diverticulitis    Dyslipidemia    Headache(784.0)    Hyperlipidemia    Hypertension    Migraine    UTI (urinary tract infection)     Past Surgical History:  Procedure Laterality Date   bleeding ulcers     22 years ago   vein procedures     right and left lower extremity vein procedures    Family History  Problem Relation Age of Onset   Heart disease Mother        age 24   Diabetes Mother    Hypertension Mother    Hypertension Sister     Social History   Socioeconomic  History   Marital status: Single    Spouse name: Not on file   Number of children: Not on file   Years of education: Not on file   Highest education level: Not on file  Occupational History   Not on file  Tobacco Use   Smoking status: Former    Packs/day: 2.00    Years: 10.00    Pack years: 20.00    Types: Cigarettes    Quit date: 01/19/1983    Years since quitting: 37.6   Smokeless tobacco: Never  Vaping Use   Vaping Use: Never used  Substance and Sexual Activity   Alcohol use: No   Drug use: No   Sexual activity: Not on file  Other Topics Concern   Not on file  Social History Narrative   She retired early. She is non smoker. Never knew her father. Her mother died age 51 of heart disease. Her half sister Madison Hernandez   Social Determinants of Health   Financial Resource Strain: Not on file  Food Insecurity: Not on file  Transportation Needs: Not on file  Physical Activity: Not on file  Stress: Not on file  Social Connections: Not on file  Intimate Partner Violence: Not on file  Outpatient Medications Prior to Visit  Medication Sig Dispense Refill   amLODipine (NORVASC) 10 MG tablet TAKE ONE-HALF TABLET BY  MOUTH TWICE DAILY 90 tablet 3   benazepril (LOTENSIN) 20 MG tablet TAKE 1 TABLET BY MOUTH  DAILY 90 tablet 0   furosemide (LASIX) 20 MG tablet Take one tablet by mouth daily as needed for leg edema 90 tablet 3   ketoconazole (NIZORAL) 2 % cream Apply 1 application topically daily. 15 g 0   levothyroxine (SYNTHROID) 25 MCG tablet TAKE 1 TABLET BY MOUTH  DAILY BEFORE BREAKFAST 90 tablet 3   LINZESS 145 MCG CAPS capsule TAKE 1 CAPSULE BY MOUTH  DAILY BEFORE BREAKFAST 90 capsule 0   meclizine (ANTIVERT) 12.5 MG tablet Take 1 tablet (12.5 mg total) by mouth 3 (three) times daily as needed for dizziness. 30 tablet 0   metroNIDAZOLE (METROCREAM) 0.75 % cream Apply topically 2 (two) times daily. 45 g 0   pantoprazole (PROTONIX) 20 MG tablet TAKE 1 TABLET BY MOUTH  DAILY  90 tablet 3   Pitavastatin Calcium (LIVALO) 4 MG TABS Take 1 tablet (4 mg total) by mouth daily. 90 tablet 3   RESTASIS 0.05 % ophthalmic emulsion as directed.     traZODone (DESYREL) 50 MG tablet Take 0.5-1 tablets (25-50 mg total) by mouth at bedtime as needed for sleep. 30 tablet 11   triamcinolone (KENALOG) 0.1 % Apply 1 application topically 2 (two) times daily. 80 g 1   No facility-administered medications prior to visit.    Allergies  Allergen Reactions   Avelox [Moxifloxacin Hcl In Nacl] Shortness Of Breath   Mucinex [Guaifenesin Er] Other (See Comments)    "Makes my heart flutter"   Other     Other reaction(s): Unknown   Sulfa Antibiotics Hives   Cefdinir Rash    ROS Review of Systems  Constitutional:  Negative for fatigue.  Eyes:  Negative for visual disturbance.  Respiratory:  Negative for cough, chest tightness, shortness of breath and wheezing.   Cardiovascular:  Negative for chest pain, palpitations and leg swelling.  Gastrointestinal:  Negative for abdominal pain.  Genitourinary:  Negative for dysuria.  Neurological:  Negative for dizziness, seizures, syncope, weakness and light-headedness.     Objective:    Physical Exam Constitutional:      Appearance: She is well-developed.  Eyes:     Pupils: Pupils are equal, round, and reactive to light.  Neck:     Thyroid: No thyromegaly.     Vascular: No JVD.  Cardiovascular:     Rate and Rhythm: Normal rate and regular rhythm.     Heart sounds:    No gallop.  Pulmonary:     Effort: Pulmonary effort is normal. No respiratory distress.     Breath sounds: Normal breath sounds. No wheezing or rales.  Musculoskeletal:     Cervical back: Neck supple.     Right lower leg: No edema.     Left lower leg: No edema.  Neurological:     Mental Status: She is alert.    BP (!) 150/60 (BP Location: Left Arm, Patient Position: Sitting, Cuff Size: Normal)   Pulse 90   Temp 97.8 F (36.6 C) (Oral)   Wt 154 lb 11.2 oz  (70.2 kg)   SpO2 98%   BMI 26.55 kg/m  Wt Readings from Last 3 Encounters:  09/17/20 154 lb 11.2 oz (70.2 kg)  05/22/20 155 lb 6.4 oz (70.5 kg)  05/19/20 160 lb 3.2 oz (72.7 kg)  Health Maintenance Due  Topic Date Due   Hepatitis C Screening  Never done   Zoster Vaccines- Shingrix (1 of 2) Never done   DEXA SCAN  Never done   TETANUS/TDAP  01/21/2008   COVID-19 Vaccine (4 - Booster for Moderna series) 02/28/2020   INFLUENZA VACCINE  08/18/2020    There are no preventive care reminders to display for this patient.  Lab Results  Component Value Date   TSH 3.18 08/31/2019   Lab Results  Component Value Date   WBC 8.1 05/22/2020   HGB 12.2 05/22/2020   HCT 36.4 05/22/2020   MCV 89.8 05/22/2020   PLT 258.0 05/22/2020   Lab Results  Component Value Date   NA 136 05/22/2020   K 4.5 05/22/2020   CO2 23 05/22/2020   GLUCOSE 107 (H) 05/22/2020   BUN 19 05/22/2020   CREATININE 1.14 05/22/2020   BILITOT 0.4 05/19/2020   ALKPHOS 66 05/19/2020   AST 14 05/19/2020   ALT 12 05/19/2020   PROT 6.8 05/19/2020   ALBUMIN 4.4 05/19/2020   CALCIUM 10.0 05/22/2020   ANIONGAP 10 08/05/2016   GFR 46.21 (L) 05/22/2020   Lab Results  Component Value Date   CHOL 222 (H) 01/25/2020   Lab Results  Component Value Date   HDL 65.80 01/25/2020   Lab Results  Component Value Date   LDLCALC 127 (H) 01/25/2020   Lab Results  Component Value Date   TRIG 147.0 01/25/2020   Lab Results  Component Value Date   CHOLHDL 3 01/25/2020   Lab Results  Component Value Date   HGBA1C 6.4 (A) 09/17/2020      Assessment & Plan:   #1 hypertension.  Poorly controlled.  Repeat reading left arm seated after rest 152/60. -Add chlorthalidone 25 mg once daily -Continue benazepril and amlodipine -Reassess in about 4 to 5 weeks  #2 history of hyperglycemia.  She has prediabetes range blood sugars.  A1c today stable at 6.4% -Continue low glycemic diet and recommend monitoring every 6 to  12 months  #3 hypothyroidism on low-dose replacement -Recheck TSH  #4 history of severe dyslipidemia.  Patient intolerant with multiple statins.  Possible recent headache related to Livalo -Recheck lipids.  We did discuss possible alternatives such as Repatha but she is reluctant and declines   Meds ordered this encounter  Medications   chlorthalidone (HYGROTON) 25 MG tablet    Sig: Take 1 tablet (25 mg total) by mouth daily.    Dispense:  30 tablet    Refill:  5    Follow-up: Return in about 5 weeks (around 10/22/2020).    Carolann Littler, MD

## 2020-09-17 NOTE — Patient Instructions (Signed)
Please check and see which cholesterol medication you are taking- ? Livalo.    Set up one month follow up.   A1C today is stable at 6.4%

## 2020-09-19 ENCOUNTER — Telehealth: Payer: Self-pay

## 2020-09-19 NOTE — Telephone Encounter (Signed)
She wants to know since her cholesterol has been so high can she take two Rosuvastatin per day. Patient said she hasn't urinated all day, which she is very concerned about  she started a new blood pressure medication yesterday, and she received the flu shot and she stated that on today she has been very, very tired.  Patient also wants to know should she continue taking her other blood pressure medications.  Please advise

## 2020-09-23 NOTE — Telephone Encounter (Signed)
Spoke with patient about message, informed her of correct name of cholesterol medication.   Voiced understanding, nothing further is needed

## 2020-09-25 ENCOUNTER — Other Ambulatory Visit: Payer: Self-pay | Admitting: Family Medicine

## 2020-10-03 ENCOUNTER — Other Ambulatory Visit: Payer: Self-pay | Admitting: Family Medicine

## 2020-10-03 DIAGNOSIS — E782 Mixed hyperlipidemia: Secondary | ICD-10-CM

## 2020-10-06 ENCOUNTER — Telehealth: Payer: Self-pay | Admitting: Family Medicine

## 2020-10-06 DIAGNOSIS — Z79899 Other long term (current) drug therapy: Secondary | ICD-10-CM | POA: Diagnosis not present

## 2020-10-06 DIAGNOSIS — M5412 Radiculopathy, cervical region: Secondary | ICD-10-CM | POA: Diagnosis not present

## 2020-10-06 DIAGNOSIS — M791 Myalgia, unspecified site: Secondary | ICD-10-CM | POA: Diagnosis not present

## 2020-10-06 NOTE — Telephone Encounter (Signed)
Spoke with the patient. She is aware of Dr. Burchette's message.  °

## 2020-10-06 NOTE — Telephone Encounter (Signed)
Patient called because she is a rash on both arms, states it looks like bee stings and feels very bumpy. Rash started yesterday evening. Patient states it does not itch and it's not too painful. Patient took benadryl but rash did not get better. Patient has taken medication for today but will not take anymore      Good callback number is [phone]    Please Advise

## 2020-10-22 ENCOUNTER — Ambulatory Visit: Payer: Medicare Other | Admitting: Family Medicine

## 2020-11-04 ENCOUNTER — Other Ambulatory Visit: Payer: Self-pay | Admitting: Family Medicine

## 2020-11-13 ENCOUNTER — Telehealth: Payer: Self-pay

## 2020-11-13 DIAGNOSIS — R739 Hyperglycemia, unspecified: Secondary | ICD-10-CM

## 2020-11-13 NOTE — Telephone Encounter (Signed)
Patient called requesting Rx refill  Pitavastatin Calcium (LIVALO) 4 MG TABS And pt also wants to know if she needs an appt  for labs.

## 2020-11-14 NOTE — Telephone Encounter (Signed)
Future labs have been ordered. Spoke with the patient and a lab appointment has been scheduled.

## 2020-11-14 NOTE — Addendum Note (Signed)
Addended by: Rebecca Eaton on: 11/14/2020 01:30 PM   Modules accepted: Orders

## 2020-11-14 NOTE — Telephone Encounter (Signed)
Patient has a year supply at her pharmacy. I will call and make her aware. Please advise. Does she need to have an appointment for labs?

## 2020-11-17 ENCOUNTER — Other Ambulatory Visit (INDEPENDENT_AMBULATORY_CARE_PROVIDER_SITE_OTHER): Payer: Medicare Other

## 2020-11-17 ENCOUNTER — Other Ambulatory Visit: Payer: Self-pay

## 2020-11-17 DIAGNOSIS — R739 Hyperglycemia, unspecified: Secondary | ICD-10-CM | POA: Diagnosis not present

## 2020-11-17 DIAGNOSIS — E039 Hypothyroidism, unspecified: Secondary | ICD-10-CM

## 2020-11-17 LAB — TSH: TSH: 6.31 u[IU]/mL — ABNORMAL HIGH (ref 0.35–5.50)

## 2020-11-17 LAB — LIPID PANEL
Cholesterol: 200 mg/dL (ref 0–200)
HDL: 61.3 mg/dL (ref >39.00)
LDL Cholesterol: 108 mg/dL — ABNORMAL HIGH (ref 0–99)
NonHDL: 138.64
Total CHOL/HDL Ratio: 3
Triglycerides: 154 mg/dL — ABNORMAL HIGH (ref 0.0–149.0)
VLDL: 30.8 mg/dL (ref 0.0–40.0)

## 2020-11-19 ENCOUNTER — Telehealth: Payer: Self-pay | Admitting: Family Medicine

## 2020-11-19 DIAGNOSIS — E782 Mixed hyperlipidemia: Secondary | ICD-10-CM

## 2020-11-19 NOTE — Telephone Encounter (Signed)
Pt is calling and has not received rosuvastatin 20 mg from [facility] and would like medication to go to  [facility] [id number] [facility], [place] - [address] Phone:  [phone]  Fax:  [phone]

## 2020-11-19 NOTE — Telephone Encounter (Signed)
Please advise. Rx is not on the current med list. Looks like the last time we filled this was 07/02/2019

## 2020-11-20 MED ORDER — ROSUVASTATIN CALCIUM 20 MG PO TABS: 20.0000 mg | ORAL_TABLET | Freq: Every day | ORAL | 0 refills | Status: DC

## 2020-11-20 NOTE — Telephone Encounter (Signed)
Spoke with the patient, she stated she has been taking the rosuvastatin. Med list has been corrected and refill has been sent in.

## 2020-11-28 ENCOUNTER — Telehealth (INDEPENDENT_AMBULATORY_CARE_PROVIDER_SITE_OTHER): Payer: Medicare Other | Admitting: Internal Medicine

## 2020-11-28 ENCOUNTER — Encounter: Payer: Self-pay | Admitting: Internal Medicine

## 2020-11-28 VITALS — Wt 150.0 lb

## 2020-11-28 DIAGNOSIS — R051 Acute cough: Secondary | ICD-10-CM | POA: Diagnosis not present

## 2020-11-28 DIAGNOSIS — R0602 Shortness of breath: Secondary | ICD-10-CM

## 2020-11-28 DIAGNOSIS — Z20822 Contact with and (suspected) exposure to covid-19: Secondary | ICD-10-CM | POA: Diagnosis not present

## 2020-11-28 DIAGNOSIS — J069 Acute upper respiratory infection, unspecified: Secondary | ICD-10-CM | POA: Diagnosis not present

## 2020-11-28 NOTE — Progress Notes (Signed)
Virtual Visit via Telephone Note  I connected with Madison Hernandez on 11/28/20 at  2:30 PM EST by telephone and verified that I am speaking with the correct person using two identifiers.   I discussed the limitations, risks, security and privacy concerns of performing an evaluation and management service by telephone and the availability of in person appointments. I also discussed with the patient that there may be a patient responsible charge related to this service. The patient expressed understanding and agreed to proceed.  Location patient: home Location provider: work office Participants present for the call: patient, provider Patient did not have a visit in the prior 7 days to address this/these issue(s).   History of Present Illness:  She has scheduled this visit to discuss some symptoms she has been having that started about 3 weeks ago and have become progressive.  She has been having a nonproductive cough, has chills but no fever, significant body aches and nasal congestion.  The most concerning thing she has told me today is that she feels quite short of breath and feels like something heavy is sitting on her chest.   Observations/Objective: Patient sounds cheerful and well on the phone. I do not appreciate any increased work of breathing. Speech and thought processing are grossly intact. Patient reported vitals: None reported   Current Outpatient Medications:    amLODipine (NORVASC) 10 MG tablet, TAKE ONE-HALF TABLET BY  MOUTH TWICE DAILY, Disp: 90 tablet, Rfl: 3   benazepril (LOTENSIN) 20 MG tablet, TAKE 1 TABLET BY MOUTH  DAILY, Disp: 90 tablet, Rfl: 0   chlorthalidone (HYGROTON) 25 MG tablet, Take 1 tablet (25 mg total) by mouth daily., Disp: 30 tablet, Rfl: 5   furosemide (LASIX) 20 MG tablet, Take one tablet by mouth daily as needed for leg edema, Disp: 90 tablet, Rfl: 3   ketoconazole (NIZORAL) 2 % cream, Apply 1 application topically daily., Disp: 15 g, Rfl: 0    levothyroxine (SYNTHROID) 25 MCG tablet, TAKE 1 TABLET BY MOUTH  DAILY BEFORE BREAKFAST, Disp: 90 tablet, Rfl: 3   LINZESS 145 MCG CAPS capsule, TAKE 1 CAPSULE BY MOUTH  DAILY BEFORE BREAKFAST, Disp: 90 capsule, Rfl: 3   meclizine (ANTIVERT) 12.5 MG tablet, Take 1 tablet (12.5 mg total) by mouth 3 (three) times daily as needed for dizziness., Disp: 30 tablet, Rfl: 0   metroNIDAZOLE (METROCREAM) 0.75 % cream, Apply topically 2 (two) times daily., Disp: 45 g, Rfl: 0   pantoprazole (PROTONIX) 20 MG tablet, TAKE 1 TABLET BY MOUTH  DAILY, Disp: 90 tablet, Rfl: 3   Pitavastatin Calcium (LIVALO) 4 MG TABS, Take 1 tablet (4 mg total) by mouth daily., Disp: 90 tablet, Rfl: 3   RESTASIS 0.05 % ophthalmic emulsion, as directed., Disp: , Rfl:    rosuvastatin (CRESTOR) 20 MG tablet, Take 1 tablet (20 mg total) by mouth daily. NEEDS OFFICE VISIT FOR FURTHER REFILLS, Disp: 90 tablet, Rfl: 0   traZODone (DESYREL) 50 MG tablet, Take 0.5-1 tablets (25-50 mg total) by mouth at bedtime as needed for sleep., Disp: 30 tablet, Rfl: 11   triamcinolone (KENALOG) 0.1 %, Apply 1 application topically 2 (two) times daily., Disp: 80 g, Rfl: 1  Review of Systems:  Constitutional: Denies fever, chills, diaphoresis, appetite change and fatigue.  HEENT: Denies photophobia, eye pain, redness, hearing loss,  mouth sores, trouble swallowing, neck pain, neck stiffness and tinnitus.   Respiratory:  wheezing.   Cardiovascular: Deniespalpitations and leg swelling.  Gastrointestinal: Denies nausea, vomiting,  abdominal pain, diarrhea, constipation, blood in stool and abdominal distention.  Genitourinary: Denies dysuria, urgency, frequency, hematuria, flank pain and difficulty urinating.  Endocrine: Denies: hot or cold intolerance, sweats, changes in hair or nails, polyuria, polydipsia. Musculoskeletal: Denies myalgias, back pain, joint swelling, arthralgias and gait problem.  Skin: Denies pallor, rash and wound.  Neurological: Denies  dizziness, seizures, syncope, weakness, light-headedness, numbness and headaches.  Hematological: Denies adenopathy. Easy bruising, personal or family bleeding history  Psychiatric/Behavioral: Denies suicidal ideation, mood changes, confusion, nervousness, sleep disturbance and agitation   Assessment and Plan:  SOB (shortness of breath) I am quite concerned with her description of dyspnea and heaviness in her chest.  Have advised urgent care/ED evaluation given it is late on a Friday afternoon and we cannot accommodate her in the office.  I am concerned about the possibility of pneumonia, flu, COVID and may be even CAD/PE.   I discussed the assessment and treatment plan with the patient. The patient was provided an opportunity to ask questions and all were answered. The patient agreed with the plan and demonstrated an understanding of the instructions.   The patient was advised to call back or seek an in-person evaluation if the symptoms worsen or if the condition fails to improve as anticipated.  I provided 14 minutes of non-face-to-face time during this encounter.   Lelon Frohlich, MD [facility]

## 2020-12-04 DIAGNOSIS — J209 Acute bronchitis, unspecified: Secondary | ICD-10-CM | POA: Diagnosis not present

## 2020-12-10 DIAGNOSIS — J209 Acute bronchitis, unspecified: Secondary | ICD-10-CM | POA: Diagnosis not present

## 2020-12-16 ENCOUNTER — Other Ambulatory Visit: Payer: Self-pay | Admitting: Family Medicine

## 2020-12-17 ENCOUNTER — Encounter: Payer: Self-pay | Admitting: Family Medicine

## 2020-12-17 ENCOUNTER — Ambulatory Visit (INDEPENDENT_AMBULATORY_CARE_PROVIDER_SITE_OTHER): Payer: Medicare Other | Admitting: Family Medicine

## 2020-12-17 VITALS — BP 160/74 | HR 87 | Temp 98.6°F | Resp 16 | Wt 157.2 lb

## 2020-12-17 DIAGNOSIS — I10 Essential (primary) hypertension: Secondary | ICD-10-CM

## 2020-12-17 DIAGNOSIS — E782 Mixed hyperlipidemia: Secondary | ICD-10-CM | POA: Diagnosis not present

## 2020-12-17 DIAGNOSIS — B370 Candidal stomatitis: Secondary | ICD-10-CM | POA: Diagnosis not present

## 2020-12-17 MED ORDER — CHLORTHALIDONE 25 MG PO TABS: 25.0000 mg | ORAL_TABLET | Freq: Every day | ORAL | 3 refills | Status: DC

## 2020-12-17 MED ORDER — FLUCONAZOLE 100 MG PO TABS: 100.0000 mg | ORAL_TABLET | Freq: Every day | ORAL | 0 refills | Status: DC

## 2020-12-17 MED ORDER — NYSTATIN 100000 UNIT/ML MT SUSP: OROMUCOSAL | 0 refills | Status: DC

## 2020-12-17 NOTE — Patient Instructions (Signed)
Get back on the Chlorthalidone for the blood pressure  Be sure to set up one month follow up.

## 2020-12-17 NOTE — Progress Notes (Signed)
Established Patient Office Visit  Subjective:  Patient ID: Madison Hernandez, female    DOB: 1940-08-23  Age: 80 y.o. MRN: [id number]  CC:  Chief Complaint  Patient presents with   Acute Visit    Follow up on difficulty breathing , states it has gotten better. Albuterol has caused some complications, dry mouth, tachycardia, lip dryness , swelling of the throat     HPI Madison Hernandez presents for follow-up regarding recent respiratory infection.  She states she had a couple urgent care visits.  She apparently was treated with multiple antibiotics as well as prednisone and albuterol inhaler.  She has had some difficulty with mouth irritation and dry feeling in her mouth over the past several days.  Is had some mouth pain and also occasional mild pain with swallowing though not consistently.  Is keeping down fluids and swallowing solids without difficulty.  She feels her cough has improved some.  No history of diabetes.  No recent steroid inhaler but was placed on prednisone recently as well as antibiotics.  History of severe hyperlipidemia.  Recent lipids were dramatically improved on rosuvastatin.  Tolerating well.  Hypertension history.  Patient states she is taking amlodipine and benazepril.  She is supposed to be on chlorthalidone but she states she has not been taking that recently.  Denies any recent headache.  No chest pains.  No peripheral edema.  Past Medical History:  Diagnosis Date   Acute ischemic colitis ([place]) 1/12   Arthritis    Bleeding ulcer    22 years ago   Blood in stool    Cardiac arrhythmia due to congenital heart disease    Depression    Diverticulitis    Dyslipidemia    Headache(784.0)    Hyperlipidemia    Hypertension    Migraine    UTI (urinary tract infection)     Past Surgical History:  Procedure Laterality Date   bleeding ulcers     22 years ago   vein procedures     right and left lower extremity vein procedures    Family History  Problem  Relation Age of Onset   Heart disease Mother        age 54   Diabetes Mother    Hypertension Mother    Hypertension Sister     Social History   Socioeconomic History   Marital status: Single    Spouse name: Not on file   Number of children: Not on file   Years of education: Not on file   Highest education level: Not on file  Occupational History   Not on file  Tobacco Use   Smoking status: Former    Packs/day: 2.00    Years: 10.00    Pack years: 20.00    Types: Cigarettes    Quit date: 01/19/1983    Years since quitting: 37.9   Smokeless tobacco: Never  Vaping Use   Vaping Use: Never used  Substance and Sexual Activity   Alcohol use: No   Drug use: No   Sexual activity: Not on file  Other Topics Concern   Not on file  Social History Narrative   She retired early. She is non smoker. Never knew her father. Her mother died age 30 of heart disease. Her half sister Madison Hernandez   Social Determinants of Health   Financial Resource Strain: Not on file  Food Insecurity: Not on file  Transportation Hernandez: Not on file  Physical Activity: Not on file  Stress: Not on file  Social Connections: Not on file  Intimate Partner Violence: Not on file    Outpatient Medications Prior to Visit  Medication Sig Dispense Refill   amLODipine (NORVASC) 10 MG tablet TAKE ONE-HALF TABLET BY  MOUTH TWICE DAILY 90 tablet 3   benazepril (LOTENSIN) 20 MG tablet TAKE 1 TABLET BY MOUTH  DAILY 90 tablet 0   levothyroxine (SYNTHROID) 25 MCG tablet TAKE 1 TABLET BY MOUTH  DAILY BEFORE BREAKFAST 90 tablet 3   LINZESS 145 MCG CAPS capsule TAKE 1 CAPSULE BY MOUTH  DAILY BEFORE BREAKFAST 90 capsule 3   pantoprazole (PROTONIX) 20 MG tablet TAKE 1 TABLET BY MOUTH  DAILY 90 tablet 3   RESTASIS 0.05 % ophthalmic emulsion as directed.     rosuvastatin (CRESTOR) 20 MG tablet Take 1 tablet (20 mg total) by mouth daily. Hernandez OFFICE VISIT FOR FURTHER REFILLS 90 tablet 0   chlorthalidone (HYGROTON) 25 MG  tablet Take 1 tablet (25 mg total) by mouth daily. 30 tablet 5   furosemide (LASIX) 20 MG tablet Take one tablet by mouth daily as needed for leg edema 90 tablet 3   Pitavastatin Calcium (LIVALO) 4 MG TABS Take 1 tablet (4 mg total) by mouth daily. 90 tablet 3   ketoconazole (NIZORAL) 2 % cream Apply 1 application topically daily. (Patient not taking: Reported on 12/17/2020) 15 g 0   metroNIDAZOLE (METROCREAM) 0.75 % cream Apply topically 2 (two) times daily. (Patient not taking: Reported on 12/17/2020) 45 g 0   traZODone (DESYREL) 50 MG tablet Take 0.5-1 tablets (25-50 mg total) by mouth at bedtime as needed for sleep. (Patient not taking: Reported on 12/17/2020) 30 tablet 11   meclizine (ANTIVERT) 12.5 MG tablet Take 1 tablet (12.5 mg total) by mouth 3 (three) times daily as needed for dizziness. (Patient not taking: Reported on 12/17/2020) 30 tablet 0   triamcinolone (KENALOG) 0.1 % Apply 1 application topically 2 (two) times daily. (Patient not taking: Reported on 12/17/2020) 80 g 1   No facility-administered medications prior to visit.    Allergies  Allergen Reactions   Avelox [Moxifloxacin Hcl In Nacl] Shortness Of Breath   Mucinex [Guaifenesin Er] Other (See Comments)    "Makes my heart flutter"   Other     Other reaction(s): Unknown   Sulfa Antibiotics Hives   Albuterol Swelling and Palpitations   Cefdinir Rash    ROS Review of Systems  Constitutional:  Negative for fatigue and fever.  HENT:  Positive for sore throat.   Eyes:  Negative for visual disturbance.  Respiratory:  Negative for cough, chest tightness, shortness of breath and wheezing.   Cardiovascular:  Negative for chest pain, palpitations and leg swelling.  Neurological:  Negative for dizziness, seizures, syncope, weakness, light-headedness and headaches.     Objective:    Physical Exam Vitals reviewed.  Constitutional:      Appearance: Normal appearance.  HENT:     Mouth/Throat:     Comments: She has  extensive thrush involving the tongue, hard palate, soft palate, and buccal mucosa. Cardiovascular:     Rate and Rhythm: Normal rate and regular rhythm.  Pulmonary:     Effort: Pulmonary effort is normal.     Breath sounds: Normal breath sounds. No wheezing or rales.  Musculoskeletal:     Cervical back: Neck supple.  Lymphadenopathy:     Cervical: No cervical adenopathy.  Neurological:     Mental Status: She is alert.    BP (!) 160/74 (  BP Location: Left Arm, Patient Position: Sitting, Cuff Size: Normal)   Pulse 87   Temp 98.6 F (37 C) (Oral)   Resp 16   Wt 157 lb 3.2 oz (71.3 kg)   SpO2 96%   BMI 26.98 kg/m  Wt Readings from Last 3 Encounters:  12/17/20 157 lb 3.2 oz (71.3 kg)  11/28/20 150 lb (68 kg)  09/17/20 154 lb 11.2 oz (70.2 kg)     Health Maintenance Due  Topic Date Due   Hepatitis C Screening  Never done   Zoster Vaccines- Shingrix (1 of 2) Never done   DEXA SCAN  Never done   TETANUS/TDAP  01/21/2008   COVID-19 Vaccine (4 - Booster for Moderna series) 01/23/2020    There are no preventive care reminders to display for this patient.  Lab Results  Component Value Date   TSH 6.31 (H) 11/17/2020   Lab Results  Component Value Date   WBC 8.1 05/22/2020   HGB 12.2 05/22/2020   HCT 36.4 05/22/2020   MCV 89.8 05/22/2020   PLT 258.0 05/22/2020   Lab Results  Component Value Date   NA 139 09/17/2020   K 5.3 No hemolysis seen (H) 09/17/2020   CO2 26 09/17/2020   GLUCOSE 127 (H) 09/17/2020   BUN 24 (H) 09/17/2020   CREATININE 1.29 (H) 09/17/2020   BILITOT 0.5 09/17/2020   ALKPHOS 74 09/17/2020   AST 19 09/17/2020   ALT 18 09/17/2020   PROT 7.5 09/17/2020   ALBUMIN 4.4 09/17/2020   CALCIUM 10.5 09/17/2020   ANIONGAP 10 08/05/2016   GFR 39.75 (L) 09/17/2020   Lab Results  Component Value Date   CHOL 200 11/17/2020   Lab Results  Component Value Date   HDL 61.30 11/17/2020   Lab Results  Component Value Date   LDLCALC 108 (H) 11/17/2020    Lab Results  Component Value Date   TRIG 154.0 (H) 11/17/2020   Lab Results  Component Value Date   CHOLHDL 3 11/17/2020   Lab Results  Component Value Date   HGBA1C 6.4 (A) 09/17/2020      Assessment & Plan:   #1 thrush with fairly extensive involvement.  Probably related to her recent prednisone and multiple antibiotics.  No history of immunosuppressant therapy or diabetes. -Avoid further antibiotics at this time -Nystatin oral suspension 5 mL retained in mouth for several minutes, swish, gargle, and spit 4 times daily -Fluconazole 100 mg by mouth once daily for 10 days -Touch base if symptoms not fully improved in a week or so  #2 hypertension poorly controlled.  Patient remains on benazepril and amlodipine but apparently not taking chlorthalidone  -Refilled chlorthalidone and get back on this daily and reassess here 1 month to reassess blood pressure and recheck basic metabolic panel  #3 history of severe hyperlipidemia.  Patient on Crestor.  Lipids greatly improved on recent labs  Meds ordered this encounter  Medications   chlorthalidone (HYGROTON) 25 MG tablet    Sig: Take 1 tablet (25 mg total) by mouth daily.    Dispense:  90 tablet    Refill:  3   nystatin (MYCOSTATIN) 100000 UNIT/ML suspension    Sig: Take 5 ml and swish, gargle, and spit four times daily    Dispense:  240 mL    Refill:  0   fluconazole (DIFLUCAN) 100 MG tablet    Sig: Take 1 tablet (100 mg total) by mouth daily.    Dispense:  10 tablet  Refill:  0    Follow-up: Return today (on 12/17/2020).    Carolann Littler, MD

## 2020-12-29 DIAGNOSIS — Z79899 Other long term (current) drug therapy: Secondary | ICD-10-CM | POA: Diagnosis not present

## 2020-12-29 DIAGNOSIS — M546 Pain in thoracic spine: Secondary | ICD-10-CM | POA: Diagnosis not present

## 2020-12-29 DIAGNOSIS — M5412 Radiculopathy, cervical region: Secondary | ICD-10-CM | POA: Diagnosis not present

## 2020-12-29 DIAGNOSIS — M791 Myalgia, unspecified site: Secondary | ICD-10-CM | POA: Diagnosis not present

## 2021-01-03 DIAGNOSIS — R739 Hyperglycemia, unspecified: Secondary | ICD-10-CM | POA: Diagnosis not present

## 2021-01-03 DIAGNOSIS — R5381 Other malaise: Secondary | ICD-10-CM | POA: Diagnosis not present

## 2021-01-03 DIAGNOSIS — R509 Fever, unspecified: Secondary | ICD-10-CM | POA: Diagnosis not present

## 2021-01-03 DIAGNOSIS — R059 Cough, unspecified: Secondary | ICD-10-CM | POA: Diagnosis not present

## 2021-01-03 DIAGNOSIS — Z20822 Contact with and (suspected) exposure to covid-19: Secondary | ICD-10-CM | POA: Diagnosis not present

## 2021-01-16 ENCOUNTER — Other Ambulatory Visit: Payer: Self-pay

## 2021-01-16 ENCOUNTER — Ambulatory Visit (INDEPENDENT_AMBULATORY_CARE_PROVIDER_SITE_OTHER): Payer: Medicare Other | Admitting: Family Medicine

## 2021-01-16 VITALS — BP 142/58 | HR 89 | Temp 97.6°F | Wt 154.3 lb

## 2021-01-16 DIAGNOSIS — I10 Essential (primary) hypertension: Secondary | ICD-10-CM | POA: Diagnosis not present

## 2021-01-16 DIAGNOSIS — N179 Acute kidney failure, unspecified: Secondary | ICD-10-CM

## 2021-01-16 DIAGNOSIS — R739 Hyperglycemia, unspecified: Secondary | ICD-10-CM | POA: Diagnosis not present

## 2021-01-16 DIAGNOSIS — B370 Candidal stomatitis: Secondary | ICD-10-CM | POA: Diagnosis not present

## 2021-01-16 LAB — BASIC METABOLIC PANEL
BUN: 23 mg/dL (ref 6–23)
CO2: 22 mEq/L (ref 19–32)
Calcium: 9.9 mg/dL (ref 8.4–10.5)
Chloride: 107 mEq/L (ref 96–112)
Creatinine, Ser: 1.22 mg/dL — ABNORMAL HIGH (ref 0.40–1.20)
GFR: 42.4 mL/min — ABNORMAL LOW (ref >60.00)
Glucose, Bld: 125 mg/dL — ABNORMAL HIGH (ref 70–99)
Potassium: 4.6 mEq/L (ref 3.5–5.1)
Sodium: 140 mEq/L (ref 135–145)

## 2021-01-16 LAB — HEMOGLOBIN A1C: Hgb A1c MFr Bld: 7.4 % — ABNORMAL HIGH (ref 4.6–6.5)

## 2021-01-16 MED ORDER — METFORMIN HCL 500 MG PO TABS: 500.0000 mg | ORAL_TABLET | Freq: Two times a day (BID) | ORAL | 3 refills | Status: DC

## 2021-01-16 NOTE — Progress Notes (Signed)
Established Patient Office Visit  Subjective:  Patient ID: Madison Hernandez, female    DOB: 1940-12-29  Age: 80 y.o. MRN: [id number]  CC:  Chief Complaint  Patient presents with   Follow-up    HPI Madison Hernandez presents for multiple items as follows  Recent thrush.  Patient had been on recent antibiotics as well as prednisone which were risk factors.  We treated with oral nystatin as well as fluconazole and after few days her symptoms promptly improved.  She denies any mouth pain and has not seen any evidence recurrence since then.  She has hypertension and takes amlodipine and benazepril regularly.  She has had several elevated readings here with systolics generally around 160.  We had sent in refills of chlorthalidone and strongly suggest that she get back on the chlorthalidone last visit but apparently she never did.  She had recent urgent care visit in December and had elevated blood pressure there as well.  She had labs there including elevated A1c of 7.5% which is up from previous 6.4% here.  Also her creatinine had increased from baseline around 1.1-1.53.  She states that she felt poorly at that time and perhaps have been slightly dehydrated.  Past Medical History:  Diagnosis Date   Acute ischemic colitis ([place]) 1/12   Arthritis    Bleeding ulcer    22 years ago   Blood in stool    Cardiac arrhythmia due to congenital heart disease    Depression    Diverticulitis    Dyslipidemia    Headache(784.0)    Hyperlipidemia    Hypertension    Migraine    UTI (urinary tract infection)     Past Surgical History:  Procedure Laterality Date   bleeding ulcers     22 years ago   vein procedures     right and left lower extremity vein procedures    Family History  Problem Relation Age of Onset   Heart disease Mother        age 52   Diabetes Mother    Hypertension Mother    Hypertension Sister     Social History   Socioeconomic History   Marital status: Single     Spouse name: Not on file   Number of children: Not on file   Years of education: Not on file   Highest education level: Not on file  Occupational History   Not on file  Tobacco Use   Smoking status: Former    Packs/day: 2.00    Years: 10.00    Pack years: 20.00    Types: Cigarettes    Quit date: 01/19/1983    Years since quitting: 38.0   Smokeless tobacco: Never  Vaping Use   Vaping Use: Never used  Substance and Sexual Activity   Alcohol use: No   Drug use: No   Sexual activity: Not on file  Other Topics Concern   Not on file  Social History Narrative   She retired early. She is non smoker. Never knew her father. Her mother died age 26 of heart disease. Her half sister [name]   Social Determinants of Health   Financial Resource Strain: Not on file  Food Insecurity: Not on file  Transportation Needs: Not on file  Physical Activity: Not on file  Stress: Not on file  Social Connections: Not on file  Intimate Partner Violence: Not on file    Outpatient Medications Prior to Visit  Medication Sig Dispense Refill  amLODipine (NORVASC) 10 MG tablet TAKE ONE-HALF TABLET BY  MOUTH TWICE DAILY 90 tablet 3   benazepril (LOTENSIN) 20 MG tablet TAKE 1 TABLET BY MOUTH  DAILY 90 tablet 0   chlorthalidone (HYGROTON) 25 MG tablet Take 1 tablet (25 mg total) by mouth daily. 90 tablet 3   fluconazole (DIFLUCAN) 100 MG tablet Take 1 tablet (100 mg total) by mouth daily. 10 tablet 0   ketoconazole (NIZORAL) 2 % cream Apply 1 application topically daily. 15 g 0   levothyroxine (SYNTHROID) 25 MCG tablet TAKE 1 TABLET BY MOUTH  DAILY BEFORE BREAKFAST 90 tablet 3   LINZESS 145 MCG CAPS capsule TAKE 1 CAPSULE BY MOUTH  DAILY BEFORE BREAKFAST 90 capsule 3   metroNIDAZOLE (METROCREAM) 0.75 % cream Apply topically 2 (two) times daily. 45 g 0   nystatin (MYCOSTATIN) 100000 UNIT/ML suspension Take 5 ml and swish, gargle, and spit four times daily 240 mL 0   pantoprazole (PROTONIX) 20 MG  tablet TAKE 1 TABLET BY MOUTH  DAILY 90 tablet 3   RESTASIS 0.05 % ophthalmic emulsion as directed.     rosuvastatin (CRESTOR) 20 MG tablet Take 1 tablet (20 mg total) by mouth daily. NEEDS OFFICE VISIT FOR FURTHER REFILLS 90 tablet 0   traZODone (DESYREL) 50 MG tablet Take 0.5-1 tablets (25-50 mg total) by mouth at bedtime as needed for sleep. 30 tablet 11   No facility-administered medications prior to visit.    Allergies  Allergen Reactions   Avelox [Moxifloxacin Hcl In Nacl] Shortness Of Breath   Mucinex [Guaifenesin Er] Other (See Comments)    "Makes my heart flutter"   Other     Other reaction(s): Unknown   Sulfa Antibiotics Hives   Albuterol Swelling and Palpitations   Cefdinir Rash    ROS Review of Systems  Constitutional:  Negative for fatigue.  HENT:  Negative for sore throat.   Eyes:  Negative for visual disturbance.  Respiratory:  Negative for cough, chest tightness, shortness of breath and wheezing.   Cardiovascular:  Negative for chest pain, palpitations and leg swelling.  Endocrine: Negative for polydipsia and polyuria.  Neurological:  Negative for dizziness, seizures, syncope, weakness, light-headedness and headaches.     Objective:    Physical Exam Vitals reviewed.  Constitutional:      Appearance: Normal appearance.  HENT:     Mouth/Throat:     Comments: Previously noted thrush from prior visit is fully clear at this time Cardiovascular:     Rate and Rhythm: Normal rate and regular rhythm.  Pulmonary:     Effort: Pulmonary effort is normal.     Breath sounds: Normal breath sounds.  Musculoskeletal:     Right lower leg: No edema.     Left lower leg: No edema.  Neurological:     Mental Status: She is alert.    BP (!) 142/58 (BP Location: Left Arm, Patient Position: Sitting, Cuff Size: Normal)    Pulse 89    Temp 97.6 F (36.4 C) (Oral)    Wt 154 lb 4.8 oz (70 kg)    SpO2 98%    BMI 26.49 kg/m  Wt Readings from Last 3 Encounters:  01/16/21 154 lb  4.8 oz (70 kg)  12/17/20 157 lb 3.2 oz (71.3 kg)  11/28/20 150 lb (68 kg)     Health Maintenance Due  Topic Date Due   Hepatitis C Screening  Never done   Zoster Vaccines- Shingrix (1 of 2) Never done   DEXA SCAN  Never done   TETANUS/TDAP  01/21/2008   COVID-19 Vaccine (4 - Booster for Moderna series) 01/23/2020    There are no preventive care reminders to display for this patient.  Lab Results  Component Value Date   TSH 6.31 (H) 11/17/2020   Lab Results  Component Value Date   WBC 8.1 05/22/2020   HGB 12.2 05/22/2020   HCT 36.4 05/22/2020   MCV 89.8 05/22/2020   PLT 258.0 05/22/2020   Lab Results  Component Value Date   NA 139 09/17/2020   K 5.3 No hemolysis seen (H) 09/17/2020   CO2 26 09/17/2020   GLUCOSE 127 (H) 09/17/2020   BUN 24 (H) 09/17/2020   CREATININE 1.29 (H) 09/17/2020   BILITOT 0.5 09/17/2020   ALKPHOS 74 09/17/2020   AST 19 09/17/2020   ALT 18 09/17/2020   PROT 7.5 09/17/2020   ALBUMIN 4.4 09/17/2020   CALCIUM 10.5 09/17/2020   ANIONGAP 10 08/05/2016   GFR 39.75 (L) 09/17/2020   Lab Results  Component Value Date   CHOL 200 11/17/2020   Lab Results  Component Value Date   HDL 61.30 11/17/2020   Lab Results  Component Value Date   LDLCALC 108 (H) 11/17/2020   Lab Results  Component Value Date   TRIG 154.0 (H) 11/17/2020   Lab Results  Component Value Date   CHOLHDL 3 11/17/2020   Lab Results  Component Value Date   HGBA1C 6.4 (A) 09/17/2020      Assessment & Plan:   #1 recent thrush.  Probably related to prednisone and recent antibiotics.  She does have history of mild hyperglycemia as well.  Fully clear at this time from previous visit following treatment with nystatin and fluconazole.  Watch closely for any signs of recurrence  #2 hypertension poorly controlled.  Patient never went on chlorthalidone after her last visit.  We do have some concerns regarding whether she may have some mild cognitive deficits.  We strongly  recommend she start the chlorthalidone and reassess blood pressure within 6 to 8 weeks  #3 history of hyperglycemia.  Recent A1c 7.5% urgent care.  Recheck A1c today.  If over 7 consider low-dose metformin if renal function stable  #4 recent acute kidney injury superimposed mild chronic kidney disease. -Patient apparently has been taking several nonsteroidals and is encouraged to stop that promptly -Stay well-hydrated -Recheck basic metabolic panel   No orders of the defined types were placed in this encounter.   Follow-up: Return in about 2 months (around 03/17/2021).    Carolann Littler, MD

## 2021-01-16 NOTE — Patient Instructions (Signed)
Please GET BACK ON THE CHLORTHALIDONE ONE DAILY  Keep sugar and starch intake down.

## 2021-01-20 ENCOUNTER — Telehealth: Payer: Self-pay | Admitting: Family Medicine

## 2021-01-20 MED ORDER — METFORMIN HCL ER (OSM) 500 MG PO TB24: 500.0000 mg | ORAL_TABLET | Freq: Every day | ORAL | 3 refills | Status: DC

## 2021-01-20 NOTE — Telephone Encounter (Signed)
Patient called in regarding side effects from Diabetes medication (Metforman 500mg ) Patient is having flush feeling, nausea, sleepiness  Patient would like to know what she should do in regards to this and also know what her A1C  is.... Patient requesting a call back from [place] for advice

## 2021-01-20 NOTE — Telephone Encounter (Signed)
Patient is aware of Dr. Erick Blinks message. Rx sent in. Nothing further needed.

## 2021-01-22 ENCOUNTER — Telehealth: Payer: Self-pay | Admitting: Family Medicine

## 2021-01-22 NOTE — Telephone Encounter (Signed)
Patient called because she has begun taking  metformin (FORTAMET) 500 MG (OSM) 24 hr tablet and has developed body aches all over and had to take a tramadol to relieve that pain. She has taken the medication today, but does not have pain right now as she is on tramadol.    Patient was sent to triage     Please advise

## 2021-01-22 NOTE — Telephone Encounter (Signed)
See 01/20/21 phone message.    Please advise

## 2021-01-23 NOTE — Telephone Encounter (Signed)
Patient called to follow up on if Dr.Burchette had given any advise on the metformin. I let her know that CMA was unavailable and would give her a callback later     Please advise

## 2021-01-23 NOTE — Telephone Encounter (Signed)
Spoke with patient about message.   Voiced understanding.   Nothing further needed.

## 2021-02-14 ENCOUNTER — Other Ambulatory Visit: Payer: Self-pay | Admitting: Family Medicine

## 2021-02-17 ENCOUNTER — Other Ambulatory Visit: Payer: Self-pay | Admitting: Family Medicine

## 2021-02-25 DIAGNOSIS — M25561 Pain in right knee: Secondary | ICD-10-CM | POA: Diagnosis not present

## 2021-02-25 DIAGNOSIS — M25562 Pain in left knee: Secondary | ICD-10-CM | POA: Diagnosis not present

## 2021-02-25 DIAGNOSIS — M170 Bilateral primary osteoarthritis of knee: Secondary | ICD-10-CM | POA: Diagnosis not present

## 2021-03-11 ENCOUNTER — Other Ambulatory Visit: Payer: Self-pay

## 2021-03-11 MED ORDER — ROSUVASTATIN CALCIUM 20 MG PO TABS: ORAL_TABLET | ORAL | 0 refills | Status: DC

## 2021-03-24 DIAGNOSIS — Z79899 Other long term (current) drug therapy: Secondary | ICD-10-CM | POA: Diagnosis not present

## 2021-03-24 DIAGNOSIS — M791 Myalgia, unspecified site: Secondary | ICD-10-CM | POA: Diagnosis not present

## 2021-03-24 DIAGNOSIS — M5124 Other intervertebral disc displacement, thoracic region: Secondary | ICD-10-CM | POA: Diagnosis not present

## 2021-03-24 DIAGNOSIS — M546 Pain in thoracic spine: Secondary | ICD-10-CM | POA: Diagnosis not present

## 2021-03-24 DIAGNOSIS — M5412 Radiculopathy, cervical region: Secondary | ICD-10-CM | POA: Diagnosis not present

## 2021-03-24 DIAGNOSIS — I10 Essential (primary) hypertension: Secondary | ICD-10-CM | POA: Diagnosis not present

## 2021-04-15 ENCOUNTER — Encounter: Payer: Self-pay | Admitting: Family Medicine

## 2021-04-15 ENCOUNTER — Ambulatory Visit (INDEPENDENT_AMBULATORY_CARE_PROVIDER_SITE_OTHER): Payer: Medicare Other | Admitting: Family Medicine

## 2021-04-15 VITALS — BP 138/60 | HR 85 | Temp 97.7°F | Ht 64.0 in | Wt 141.4 lb

## 2021-04-15 DIAGNOSIS — N189 Chronic kidney disease, unspecified: Secondary | ICD-10-CM

## 2021-04-15 DIAGNOSIS — E1165 Type 2 diabetes mellitus with hyperglycemia: Secondary | ICD-10-CM | POA: Diagnosis not present

## 2021-04-15 DIAGNOSIS — E039 Hypothyroidism, unspecified: Secondary | ICD-10-CM

## 2021-04-15 DIAGNOSIS — I10 Essential (primary) hypertension: Secondary | ICD-10-CM

## 2021-04-15 DIAGNOSIS — E785 Hyperlipidemia, unspecified: Secondary | ICD-10-CM | POA: Diagnosis not present

## 2021-04-15 DIAGNOSIS — H663X1 Other chronic suppurative otitis media, right ear: Secondary | ICD-10-CM | POA: Diagnosis not present

## 2021-04-15 LAB — HEPATIC FUNCTION PANEL
ALT: 12 U/L (ref 0–35)
AST: 17 U/L (ref 0–37)
Albumin: 4.9 g/dL (ref 3.5–5.2)
Alkaline Phosphatase: 65 U/L (ref 39–117)
Bilirubin, Direct: 0.1 mg/dL (ref 0.0–0.3)
Total Bilirubin: 0.4 mg/dL (ref 0.2–1.2)
Total Protein: 7.8 g/dL (ref 6.0–8.3)

## 2021-04-15 LAB — BASIC METABOLIC PANEL
BUN: 27 mg/dL — ABNORMAL HIGH (ref 6–23)
CO2: 26 mEq/L (ref 19–32)
Calcium: 10.4 mg/dL (ref 8.4–10.5)
Chloride: 103 mEq/L (ref 96–112)
Creatinine, Ser: 1.24 mg/dL — ABNORMAL HIGH (ref 0.40–1.20)
GFR: 41.51 mL/min — ABNORMAL LOW (ref >60.00)
Glucose, Bld: 119 mg/dL — ABNORMAL HIGH (ref 70–99)
Potassium: 4.2 mEq/L (ref 3.5–5.1)
Sodium: 139 mEq/L (ref 135–145)

## 2021-04-15 LAB — TSH: TSH: 1.04 u[IU]/mL (ref 0.35–5.50)

## 2021-04-15 LAB — POCT GLYCOSYLATED HEMOGLOBIN (HGB A1C): Hemoglobin A1C: 6 % — AB (ref 4.0–5.6)

## 2021-04-15 LAB — LIPID PANEL
Cholesterol: 174 mg/dL (ref 0–200)
HDL: 63.7 mg/dL (ref >39.00)
LDL Cholesterol: 93 mg/dL (ref 0–99)
NonHDL: 109.95
Total CHOL/HDL Ratio: 3
Triglycerides: 87 mg/dL (ref 0.0–149.0)
VLDL: 17.4 mg/dL (ref 0.0–40.0)

## 2021-04-15 MED ORDER — CIPRO HC 0.2-1 % OT SUSP: 3.0000 [drp] | Freq: Two times a day (BID) | OTIC | 0 refills | Status: DC

## 2021-04-15 NOTE — Progress Notes (Signed)
? ?Established Patient Office Visit ? ?Subjective:  ?Patient ID: Madison Hernandez, female    DOB: 10/29/1940  Age: 81 y.o. MRN: [id number] ? ?CC:  ?Chief Complaint  ?Patient presents with  ? Follow-up  ? ? ?HPI ?Madison Hernandez presents for medical follow-up.  She has history of migraine headaches, hypertension, hyperlipidemia, type 2 diabetes, hypothyroidism, chronic kidney disease.  A1c last visit was 7.4%.  She did not tolerate immediate release metformin.  She is taking extended release once daily.  No polyuria.  No polydipsia.  She has lost about 13 pounds which she attributes to restricting her diet especially high glycemic foods. ? ?She has history of hyperlipidemia and is requesting follow-up lipids.  Has had intolerance with some statins in the past but apparently is taking rosuvastatin regularly.  Her blood pressure was elevated last visit we added chlorthalidone.  She states she is taking this consistently.  Recent TSH was over 6.  She would like to get her thyroid rechecked today as well. ? ?She has some chronic right ear drainage.  Mostly clear but occasionally colored.  No significant ear pain ? ?Past Medical History:  ?Diagnosis Date  ? Acute ischemic colitis ([place]) 1/12  ? Arthritis   ? Bleeding ulcer   ? 22 years ago  ? Blood in stool   ? Cardiac arrhythmia due to congenital heart disease   ? Depression   ? Diverticulitis   ? Dyslipidemia   ? Headache(784.0)   ? Hyperlipidemia   ? Hypertension   ? Migraine   ? UTI (urinary tract infection)   ? ? ?Past Surgical History:  ?Procedure Laterality Date  ? bleeding ulcers    ? 22 years ago  ? vein procedures    ? right and left lower extremity vein procedures  ? ? ?Family History  ?Problem Relation Age of Onset  ? Heart disease Mother   ?     age 15  ? Diabetes Mother   ? Hypertension Mother   ? Hypertension Sister   ? ? ?Social History  ? ?Socioeconomic History  ? Marital status: Single  ?  Spouse name: Not on file  ? Number of children: Not on file  ? Years  of education: Not on file  ? Highest education level: Not on file  ?Occupational History  ? Not on file  ?Tobacco Use  ? Smoking status: Former  ?  Packs/day: 2.00  ?  Years: 10.00  ?  Pack years: 20.00  ?  Types: Cigarettes  ?  Quit date: 01/19/1983  ?  Years since quitting: 38.2  ? Smokeless tobacco: Never  ?Vaping Use  ? Vaping Use: Never used  ?Substance and Sexual Activity  ? Alcohol use: No  ? Drug use: No  ? Sexual activity: Not on file  ?Other Topics Concern  ? Not on file  ?Social History Narrative  ? She retired early. She is non smoker. Never knew her father. Her mother died age 90 of heart disease. Her half sister [name]  ? ?Social Determinants of Health  ? ?Financial Resource Strain: Not on file  ?Food Insecurity: Not on file  ?Transportation Needs: Not on file  ?Physical Activity: Not on file  ?Stress: Not on file  ?Social Connections: Not on file  ?Intimate Partner Violence: Not on file  ? ? ?Outpatient Medications Prior to Visit  ?Medication Sig Dispense Refill  ? amLODipine (NORVASC) 10 MG tablet TAKE ONE-HALF TABLET BY  MOUTH TWICE DAILY 90 tablet  3  ? benazepril (LOTENSIN) 20 MG tablet TAKE 1 TABLET BY MOUTH DAILY 90 tablet 3  ? chlorthalidone (HYGROTON) 25 MG tablet Take 1 tablet (25 mg total) by mouth daily. 90 tablet 3  ? fluconazole (DIFLUCAN) 100 MG tablet Take 1 tablet (100 mg total) by mouth daily. 10 tablet 0  ? ketoconazole (NIZORAL) 2 % cream Apply 1 application topically daily. 15 g 0  ? levothyroxine (SYNTHROID) 25 MCG tablet TAKE 1 TABLET BY MOUTH  DAILY BEFORE BREAKFAST 90 tablet 3  ? LINZESS 145 MCG CAPS capsule TAKE 1 CAPSULE BY MOUTH  DAILY BEFORE BREAKFAST 90 capsule 3  ? metformin (FORTAMET) 500 MG (OSM) 24 hr tablet Take 1 tablet (500 mg total) by mouth daily with breakfast. 90 tablet 3  ? metroNIDAZOLE (METROCREAM) 0.75 % cream Apply topically 2 (two) times daily. 45 g 0  ? nystatin (MYCOSTATIN) 100000 UNIT/ML suspension Take 5 ml and swish, gargle, and spit four times  daily 240 mL 0  ? pantoprazole (PROTONIX) 20 MG tablet TAKE 1 TABLET BY MOUTH  DAILY 90 tablet 3  ? RESTASIS 0.05 % ophthalmic emulsion as directed.    ? rosuvastatin (CRESTOR) 20 MG tablet TAKE ONE TABLET BY MOUTH DAILY - NEED OFFICE VISIT FOR ADDITIONAL REFILLS 90 tablet 0  ? traZODone (DESYREL) 50 MG tablet Take 0.5-1 tablets (25-50 mg total) by mouth at bedtime as needed for sleep. 30 tablet 11  ? ?No facility-administered medications prior to visit.  ? ? ?Allergies  ?Allergen Reactions  ? Avelox [Moxifloxacin Hcl In Nacl] Shortness Of Breath  ? Mucinex [Guaifenesin Er] Other (See Comments)  ?  "Makes my heart flutter"  ? Other   ?  Other reaction(s): Unknown  ? Sulfa Antibiotics Hives  ? Albuterol Swelling and Palpitations  ? Cefdinir Rash  ? ? ?ROS ?Review of Systems  ?Constitutional:  Negative for fatigue and unexpected weight change.  ?HENT:  Positive for ear discharge. Negative for hearing loss.   ?Eyes:  Negative for visual disturbance.  ?Respiratory:  Negative for cough, chest tightness, shortness of breath and wheezing.   ?Cardiovascular:  Negative for chest pain, palpitations and leg swelling.  ?Endocrine: Negative for polydipsia and polyuria.  ?Neurological:  Negative for dizziness, seizures, syncope, weakness, light-headedness and headaches.  ? ?  ?Objective:  ?  ?Physical Exam ?Constitutional:   ?   Appearance: She is well-developed.  ?HENT:  ?   Ears:  ?   Comments: Left eardrum normal.  Left ear canal clear.  Right eardrum is distorted with some mostly clear drainage in the canal. ?Eyes:  ?   Pupils: Pupils are equal, round, and reactive to light.  ?Neck:  ?   Thyroid: No thyromegaly.  ?   Vascular: No JVD.  ?Cardiovascular:  ?   Rate and Rhythm: Normal rate and regular rhythm.  ?   Heart sounds:  ?  No gallop.  ?Pulmonary:  ?   Effort: Pulmonary effort is normal. No respiratory distress.  ?   Breath sounds: Normal breath sounds. No wheezing or rales.  ?Musculoskeletal:  ?   Cervical back: Neck  supple.  ?   Right lower leg: No edema.  ?   Left lower leg: No edema.  ?Neurological:  ?   Mental Status: She is alert.  ? ? ?BP 138/60 (BP Location: Right Arm, Cuff Size: Normal)   Pulse 85   Temp 97.7 ?F (36.5 ?C) (Oral)   Ht '5\' 4"'$  (1.626 m)   Wt 141  lb 6.4 oz (64.1 kg)   SpO2 97%   BMI 24.27 kg/m?  ?Wt Readings from Last 3 Encounters:  ?04/15/21 141 lb 6.4 oz (64.1 kg)  ?01/16/21 154 lb 4.8 oz (70 kg)  ?12/17/20 157 lb 3.2 oz (71.3 kg)  ? ? ? ?Health Maintenance Due  ?Topic Date Due  ? FOOT EXAM  Never done  ? OPHTHALMOLOGY EXAM  Never done  ? Hepatitis C Screening  Never done  ? Zoster Vaccines- Shingrix (1 of 2) Never done  ? DEXA SCAN  Never done  ? TETANUS/TDAP  01/21/2008  ? COVID-19 Vaccine (4 - Booster for Moderna series) 01/23/2020  ? ? ?There are no preventive care reminders to display for this patient. ? ?Lab Results  ?Component Value Date  ? TSH 6.31 (H) 11/17/2020  ? ?Lab Results  ?Component Value Date  ? WBC 8.1 05/22/2020  ? HGB 12.2 05/22/2020  ? HCT 36.4 05/22/2020  ? MCV 89.8 05/22/2020  ? PLT 258.0 05/22/2020  ? ?Lab Results  ?Component Value Date  ? NA 140 01/16/2021  ? K 4.6 01/16/2021  ? CO2 22 01/16/2021  ? GLUCOSE 125 (H) 01/16/2021  ? BUN 23 01/16/2021  ? CREATININE 1.22 (H) 01/16/2021  ? BILITOT 0.5 09/17/2020  ? ALKPHOS 74 09/17/2020  ? AST 19 09/17/2020  ? ALT 18 09/17/2020  ? PROT 7.5 09/17/2020  ? ALBUMIN 4.4 09/17/2020  ? CALCIUM 9.9 01/16/2021  ? ANIONGAP 10 08/05/2016  ? GFR 42.40 (L) 01/16/2021  ? ?Lab Results  ?Component Value Date  ? CHOL 200 11/17/2020  ? ?Lab Results  ?Component Value Date  ? HDL 61.30 11/17/2020  ? ?Lab Results  ?Component Value Date  ? LDLCALC 108 (H) 11/17/2020  ? ?Lab Results  ?Component Value Date  ? TRIG 154.0 (H) 11/17/2020  ? ?Lab Results  ?Component Value Date  ? CHOLHDL 3 11/17/2020  ? ?Lab Results  ?Component Value Date  ? HGBA1C 6.0 (A) 04/15/2021  ? ? ?  ?Assessment & Plan:  ? ?#1 type 2 diabetes improved with A1c today 6.0% down from 7.4%.   Likely related to her metformin in combination with dietary change.  She has lost some weight as above.  Recheck in 3 months.  Handout given on nutritional recommendations ? ?#2 hypertension improved on fol

## 2021-04-21 ENCOUNTER — Telehealth: Payer: Self-pay

## 2021-04-21 NOTE — Telephone Encounter (Signed)
--  Caller states she she has chest congestion and ?wheezing. Green sputum and recent hx of bronchitis. ?The last few days getting worse. ? ?04/21/2021 12:28:15 PM See HCP within 4 Hours (or PCP triage) Raenette Rover, RN, Zella Ball ? ?Comments ?User: Wilson Singer, RN Date/Time Eilene Ghazi Time): 04/21/2021 12:32:58 PM ?Attempted back line for an appointment. No answer to the line. Caller going to go to UC. ?Referrals ?GO TO FACILITY OTHER - SPECIFY ? ?04/21/21 1621: Pt states she went to UC & was dx with pneumonia. Given steroids, abx, & inhaler. Will send to PCP to let him know & determine if/when f/u appt needed. ?

## 2021-04-22 NOTE — Telephone Encounter (Signed)
Spoke with patient, she stated that she feels good today.    Will call office to schedule a 2 week follow up. ?

## 2021-04-30 ENCOUNTER — Other Ambulatory Visit: Payer: Self-pay | Admitting: Family Medicine

## 2021-05-06 ENCOUNTER — Encounter: Payer: Self-pay | Admitting: Family Medicine

## 2021-05-06 ENCOUNTER — Ambulatory Visit (INDEPENDENT_AMBULATORY_CARE_PROVIDER_SITE_OTHER): Payer: Medicare Other | Admitting: Family Medicine

## 2021-05-06 ENCOUNTER — Ambulatory Visit (INDEPENDENT_AMBULATORY_CARE_PROVIDER_SITE_OTHER): Payer: Medicare Other

## 2021-05-06 VITALS — BP 124/60 | HR 80 | Temp 97.6°F | Ht 64.0 in | Wt 141.2 lb

## 2021-05-06 DIAGNOSIS — J189 Pneumonia, unspecified organism: Secondary | ICD-10-CM

## 2021-05-06 DIAGNOSIS — E1165 Type 2 diabetes mellitus with hyperglycemia: Secondary | ICD-10-CM | POA: Diagnosis not present

## 2021-05-06 NOTE — Progress Notes (Signed)
?Subjective:  ?  ? Patient ID: Madison Hernandez, female   DOB: 29-Jul-1940, 81 y.o.   MRN: [id number] ? ?HPI ? ?Madison Hernandez is seen for reported community-acquired pneumonia.  She states after her last visit here several days later she developed some increased wheezing.  She was unable to get back in here and went to local urgent care.  We had no records.  She states she was diagnosed with "pneumonia ".  She is not sure which long.  Sounds like she received intramuscular antibiotic along with probably Augmentin and Zithromax by description and also steroid shot.  She states she never had any fever.  No hemoptysis.  No dyspnea.  She even denied cough. ? ?Did smoke briefly in her early 35s. ? ?Type 2 diabetes.  Recent A1c 6.0%.  She is requesting prescription for home glucose monitor.  No polyuria or polydipsia.  She has back on metformin. ? ?Past Medical History:  ?Diagnosis Date  ? Acute ischemic colitis ([place]) 1/12  ? Arthritis   ? Bleeding ulcer   ? 22 years ago  ? Blood in stool   ? Cardiac arrhythmia due to congenital heart disease   ? Depression   ? Diverticulitis   ? Dyslipidemia   ? Headache(784.0)   ? Hyperlipidemia   ? Hypertension   ? Migraine   ? UTI (urinary tract infection)   ? ?Past Surgical History:  ?Procedure Laterality Date  ? bleeding ulcers    ? 22 years ago  ? vein procedures    ? right and left lower extremity vein procedures  ? ? reports that she quit smoking about 38 years ago. Her smoking use included cigarettes. She has a 20.00 pack-year smoking history. She has never used smokeless tobacco. She reports that she does not drink alcohol and does not use drugs. ?family history includes Diabetes in her mother; Heart disease in her mother; Hypertension in her mother and sister. ?Allergies  ?Allergen Reactions  ? Avelox [Moxifloxacin Hcl In Nacl] Shortness Of Breath  ? Mucinex [Guaifenesin Er] Other (See Comments)  ?  "Makes my heart flutter"  ? Other   ?  Other reaction(s): Unknown  ? Sulfa Antibiotics  Hives  ? Albuterol Swelling and Palpitations  ? Cefdinir Rash  ? ? ?Review of Systems  ?Constitutional:  Negative for chills and fever.  ?Respiratory:  Negative for cough, shortness of breath and wheezing.   ?Cardiovascular:  Negative for chest pain.  ? ?   ?Objective:  ? Physical Exam ?Vitals reviewed.  ?Constitutional:   ?   Appearance: Normal appearance.  ?Cardiovascular:  ?   Rate and Rhythm: Normal rate and regular rhythm.  ?Pulmonary:  ?   Effort: Pulmonary effort is normal.  ?   Breath sounds: Normal breath sounds. No wheezing or rales.  ?Musculoskeletal:  ?   Cervical back: Neck supple.  ?   Right lower leg: No edema.  ?   Left lower leg: No edema.  ?Lymphadenopathy:  ?   Cervical: No cervical adenopathy.  ?Neurological:  ?   Mental Status: She is alert.  ? ? ?   ?Assessment:  ?   ?#1 recent reported community-acquired pneumonia.  We have no records.  She is doing better symptomatically this time though it sounds like her symptoms are actually fairly minimal at the time of her presentation. ? ?#2 type 2 diabetes well controlled with recent A1c 6.0%.  Patient requesting home glucose meter prescription ?   ?Plan:  ?   ?-  Obtain follow-up PA and lateral chest x-ray.  We need this to show resolution of recent pneumonia ? ?-Follow-up immediately for any recurrent cough, fever, or other concerns ? ?-Generated prescription for home glucose monitor.  She will need to get back with [place] regarding type of monitor so we can send in test strips. ? ?-Routine follow-up within 6 months to reassess her diabetes and other medical problems ? ?Madison Post MD ?[facility] ? ?   ?

## 2021-05-08 ENCOUNTER — Other Ambulatory Visit: Payer: Self-pay | Admitting: Family Medicine

## 2021-05-17 ENCOUNTER — Other Ambulatory Visit: Payer: Self-pay | Admitting: Family Medicine

## 2021-05-21 ENCOUNTER — Other Ambulatory Visit: Payer: Self-pay | Admitting: Family Medicine

## 2021-06-01 ENCOUNTER — Other Ambulatory Visit: Payer: Self-pay | Admitting: Family Medicine

## 2021-06-01 DIAGNOSIS — K219 Gastro-esophageal reflux disease without esophagitis: Secondary | ICD-10-CM

## 2021-06-16 ENCOUNTER — Other Ambulatory Visit: Payer: Self-pay

## 2021-06-16 MED ORDER — METFORMIN HCL ER (OSM) 500 MG PO TB24: 500.0000 mg | ORAL_TABLET | Freq: Every day | ORAL | 3 refills | Status: DC

## 2021-06-16 MED ORDER — CHLORTHALIDONE 25 MG PO TABS: 25.0000 mg | ORAL_TABLET | Freq: Every day | ORAL | 3 refills | Status: DC

## 2021-07-02 DIAGNOSIS — M25562 Pain in left knee: Secondary | ICD-10-CM | POA: Diagnosis not present

## 2021-07-02 DIAGNOSIS — M25561 Pain in right knee: Secondary | ICD-10-CM | POA: Diagnosis not present

## 2021-07-17 ENCOUNTER — Ambulatory Visit (INDEPENDENT_AMBULATORY_CARE_PROVIDER_SITE_OTHER): Payer: Medicare Other | Admitting: Family Medicine

## 2021-07-17 ENCOUNTER — Encounter: Payer: Self-pay | Admitting: Family Medicine

## 2021-07-17 VITALS — BP 136/60 | HR 90 | Temp 97.8°F | Ht 64.0 in | Wt 137.8 lb

## 2021-07-17 DIAGNOSIS — R739 Hyperglycemia, unspecified: Secondary | ICD-10-CM | POA: Diagnosis not present

## 2021-07-17 DIAGNOSIS — E039 Hypothyroidism, unspecified: Secondary | ICD-10-CM

## 2021-07-17 DIAGNOSIS — N183 Chronic kidney disease, stage 3 unspecified: Secondary | ICD-10-CM

## 2021-07-17 DIAGNOSIS — I10 Essential (primary) hypertension: Secondary | ICD-10-CM

## 2021-07-17 DIAGNOSIS — E1165 Type 2 diabetes mellitus with hyperglycemia: Secondary | ICD-10-CM

## 2021-07-17 LAB — POCT GLYCOSYLATED HEMOGLOBIN (HGB A1C): Hemoglobin A1C: 6.2 % — AB (ref 4.0–5.6)

## 2021-07-17 MED ORDER — ROSUVASTATIN CALCIUM 20 MG PO TABS
ORAL_TABLET | ORAL | 3 refills | Status: DC
Start: 2021-07-17 — End: 2022-03-31

## 2021-07-17 NOTE — Patient Instructions (Signed)
Keep up the good work.  A1C today was 6.2%.  Let's plan on 6 month follow up.

## 2021-07-17 NOTE — Progress Notes (Signed)
Established Patient Office Visit  Subjective   Patient ID: Madison Hernandez, female    DOB: 1940/01/27  Age: 81 y.o. MRN: [id number]  Chief Complaint  Patient presents with   Follow-up    HPI   Here for medical follow-up.  She has history of hypertension, type 2 diabetes, hypothyroidism, hyperlipidemia, osteoarthritis, chronic kidney disease.  She has arthritis particularly knees which limits her walking.  Currently not exercising.  Denies any recent falls.  She has lost a few pounds due to her efforts.  Not monitoring blood sugars regularly.  No polyuria or polydipsia.  Last A1c 6.0%.  She remains on metformin once daily.  Blood pressure treated with combination therapy with chlorthalidone, benazepril, and amlodipine.  She has history of severe hyperlipidemia which came down with Crestor.  Tolerating Crestor without side effects.  She is requesting refills of Crestor through [facility].  She has chronic kidney disease with GFR around 42.  Multiple UTIs in childhood.  Past Medical History:  Diagnosis Date   Acute ischemic colitis ([place]) 1/12   Arthritis    Bleeding ulcer    22 years ago   Blood in stool    Cardiac arrhythmia due to congenital heart disease    Depression    Diverticulitis    Dyslipidemia    Headache(784.0)    Hyperlipidemia    Hypertension    Migraine    UTI (urinary tract infection)    Past Surgical History:  Procedure Laterality Date   bleeding ulcers     22 years ago   vein procedures     right and left lower extremity vein procedures    reports that she quit smoking about 38 years ago. Her smoking use included cigarettes. She has a 20.00 pack-year smoking history. She has never used smokeless tobacco. She reports that she does not drink alcohol and does not use drugs. family history includes Diabetes in her mother; Heart disease in her mother; Hypertension in her mother and sister. Allergies  Allergen Reactions   Avelox [Moxifloxacin Hcl In Nacl]  Shortness Of Breath   Mucinex [Guaifenesin Er] Other (See Comments)    "Makes my heart flutter"   Other     Other reaction(s): Unknown   Sulfa Antibiotics Hives   Albuterol Swelling and Palpitations   Cefdinir Rash    Review of Systems  Constitutional:  Negative for malaise/fatigue.  Eyes:  Negative for blurred vision.  Respiratory:  Negative for shortness of breath.   Cardiovascular:  Negative for chest pain.  Neurological:  Negative for dizziness, weakness and headaches.      Objective:     BP 136/60 (BP Location: Left Arm, Patient Position: Sitting, Cuff Size: Normal)   Pulse 90   Temp 97.8 F (36.6 C) (Oral)   Ht '5\' 4"'$  (1.626 m)   Wt 137 lb 12.8 oz (62.5 kg)   SpO2 100%   BMI 23.65 kg/m    Physical Exam Constitutional:      Appearance: She is well-developed.  Eyes:     Pupils: Pupils are equal, round, and reactive to light.  Neck:     Thyroid: No thyromegaly.     Vascular: No JVD.  Cardiovascular:     Rate and Rhythm: Normal rate and regular rhythm.     Heart sounds:     No gallop.  Pulmonary:     Effort: Pulmonary effort is normal. No respiratory distress.     Breath sounds: Normal breath sounds. No wheezing or rales.  Musculoskeletal:     Cervical back: Neck supple.     Right lower leg: No edema.     Left lower leg: No edema.  Neurological:     Mental Status: She is alert.      Results for orders placed or performed in visit on 07/17/21  POCT glycosylated hemoglobin (Hb A1C)  Result Value Ref Range   Hemoglobin A1C 6.2 (A) 4.0 - 5.6 %   HbA1c POC (<> result, manual entry)     HbA1c, POC (prediabetic range)     HbA1c, POC (controlled diabetic range)        The 10-year ASCVD risk score (Arnett DK, et al., 2019) is: 57%    Assessment & Plan:   Problem List Items Addressed This Visit       Unprioritized   Type 2 diabetes mellitus with hyperglycemia ([place]) - Primary   Relevant Medications   rosuvastatin (CRESTOR) 20 MG tablet   Other  Relevant Orders   POCT glycosylated hemoglobin (Hb A1C) (Completed)   Hypothyroid   Chronic kidney disease   Hyperglycemia   HTN (hypertension)   Relevant Medications   rosuvastatin (CRESTOR) 20 MG tablet  -Refill Crestor for 1 year -Recommend she consider exercise bike if tolerated by her knees. -Continue low glycemic diet -Continue to avoid non-steroidals -Set up routine follow-up in 6 months -Recommend yearly diabetic eye exam  Return in about 6 months (around 01/16/2022).    Carolann Littler, MD

## 2021-08-01 ENCOUNTER — Other Ambulatory Visit: Payer: Self-pay | Admitting: Family Medicine

## 2021-08-01 DIAGNOSIS — I10 Essential (primary) hypertension: Secondary | ICD-10-CM

## 2021-08-06 DIAGNOSIS — S50812A Abrasion of left forearm, initial encounter: Secondary | ICD-10-CM | POA: Diagnosis not present

## 2021-08-25 ENCOUNTER — Ambulatory Visit (INDEPENDENT_AMBULATORY_CARE_PROVIDER_SITE_OTHER): Payer: Medicare Other | Admitting: Family Medicine

## 2021-08-25 ENCOUNTER — Encounter: Payer: Self-pay | Admitting: Family Medicine

## 2021-08-25 VITALS — BP 130/60 | HR 93 | Temp 97.6°F | Ht 64.0 in | Wt 140.0 lb

## 2021-08-25 DIAGNOSIS — R55 Syncope and collapse: Secondary | ICD-10-CM | POA: Diagnosis not present

## 2021-08-25 NOTE — Patient Instructions (Signed)
Stay well hydrated  Change positions slowly  Alcohol in moderation.    Follow up immediately for any recurrent episodes

## 2021-08-25 NOTE — Progress Notes (Signed)
Established Patient Office Visit  Subjective   Patient ID: Madison Hernandez, female    DOB: 01/20/1940  Age: 81 y.o. MRN: [id number]  Chief Complaint  Patient presents with   Loss of Consciousness    Patient complains of syncope, x4 days    Dizziness    Patient complains of dizziness, x4 days   Anorexia    HPI   Madison Hernandez has history of hypertension, migraine headaches, type 2 diabetes, hypothyroidism.  On Friday night she reportedly had what she thinks was a brief syncopal episode at home when she first stood up after sitting and reading for about 2 hours.  This was about 2 hours after she had dinner.  She did have a couple of "mixed drinks ".  She stood up rather suddenly and fell backwards and struck the occiput of her head.  She thinks she was only out for seconds.  Not observed.  No nausea or vomiting.  No chest pains.  No dyspnea.  No recent palpitations.  No headaches.  No confusion since then.  She does recall feeling "hot "prior to episode but no fevers or chills.  She only takes metformin for her blood sugars and had recent A1c 6.2%.  She is currently on amlodipine 10 mg once daily, chlorthalidone 25 mg daily, and benazepril 20 mg daily.  Does have occasional lightheadedness with standing but not consistently.  Does have past history of mild normocytic anemia.  Past Medical History:  Diagnosis Date   Acute ischemic colitis ([place]) 1/12   Arthritis    Bleeding ulcer    22 years ago   Blood in stool    Cardiac arrhythmia due to congenital heart disease    Depression    Diverticulitis    Dyslipidemia    Headache(784.0)    Hyperlipidemia    Hypertension    Migraine    UTI (urinary tract infection)    Past Surgical History:  Procedure Laterality Date   bleeding ulcers     22 years ago   vein procedures     right and left lower extremity vein procedures    reports that she quit smoking about 38 years ago. Her smoking use included cigarettes. She has a 20.00 pack-year  smoking history. She has never used smokeless tobacco. She reports that she does not drink alcohol and does not use drugs. family history includes Diabetes in her mother; Heart disease in her mother; Hypertension in her mother and sister. Allergies  Allergen Reactions   Avelox [Moxifloxacin Hcl In Nacl] Shortness Of Breath   Mucinex [Guaifenesin Er] Other (See Comments)    "Makes my heart flutter"   Other     Other reaction(s): Unknown   Sulfa Antibiotics Hives   Albuterol Swelling and Palpitations   Cefdinir Rash    Review of Systems  Constitutional:  Negative for chills and fever.  Respiratory:  Negative for shortness of breath.   Cardiovascular:  Negative for chest pain, palpitations and leg swelling.  Genitourinary:  Negative for dysuria.  Neurological:  Positive for dizziness. Negative for speech change, focal weakness, seizures and headaches.       See HPI      Objective:     BP 130/60 (BP Location: Left Arm, Patient Position: Sitting, Cuff Size: Normal)   Pulse 93   Temp 97.6 F (36.4 C) (Oral)   Ht '5\' 4"'$  (1.626 m)   Wt 140 lb (63.5 kg)   SpO2 98%   BMI 24.03 kg/m  BP  Readings from Last 3 Encounters:  08/25/21 130/60  07/17/21 136/60  05/06/21 124/60   Wt Readings from Last 3 Encounters:  08/25/21 140 lb (63.5 kg)  07/17/21 137 lb 12.8 oz (62.5 kg)  05/06/21 141 lb 3.2 oz (64 kg)      Physical Exam Vitals reviewed.  Constitutional:      Appearance: Normal appearance. She is well-developed.  Eyes:     Pupils: Pupils are equal, round, and reactive to light.  Neck:     Thyroid: No thyromegaly.     Vascular: No JVD.     Comments: No carotid bruit Cardiovascular:     Rate and Rhythm: Normal rate and regular rhythm.     Heart sounds: No murmur heard.    No gallop.  Pulmonary:     Effort: Pulmonary effort is normal. No respiratory distress.     Breath sounds: Normal breath sounds. No wheezing or rales.  Musculoskeletal:     Cervical back: Neck supple.      Right lower leg: No edema.     Left lower leg: No edema.  Neurological:     General: No focal deficit present.     Mental Status: She is alert and oriented to person, place, and time.     Cranial Nerves: No cranial nerve deficit.      No results found for any visits on 08/25/21.    The 10-year ASCVD risk score (Arnett DK, et al., 2019) is: 53.8%    Assessment & Plan:   Problem List Items Addressed This Visit   None Visit Diagnoses     Syncope, unspecified syncope type    -  Primary   Relevant Orders   EKG 12-Lead (Completed)   CBC with Differential/Platelet   Basic metabolic panel     Patient relates syncopal episode last Friday at home.  This occurred after eating a rather large meal with a couple of alcoholic beverages in the setting of being treated for hypertension with chlorthalidone and amlodipine.  Suspect she had some significant vasodilatation from alcohol and amlodipine. Blood pressure today supine and seated 148/62 and standing 142/60  -EKG shows sinus rhythm with no acute ST-T changes -Check CBC and basic metabolic panel -Recommend that she minimize alcohol use -Stay well-hydrated with noncaffeinated nonalcoholic beverages -Change positions slowly -Follow-up immediately for any recurrent symptoms  No follow-ups on file.    Carolann Littler, MD

## 2021-08-26 LAB — BASIC METABOLIC PANEL
BUN: 34 mg/dL — ABNORMAL HIGH (ref 6–23)
CO2: 23 mEq/L (ref 19–32)
Calcium: 10.3 mg/dL (ref 8.4–10.5)
Chloride: 105 mEq/L (ref 96–112)
Creatinine, Ser: 1.45 mg/dL — ABNORMAL HIGH (ref 0.40–1.20)
GFR: 34.32 mL/min — ABNORMAL LOW (ref >60.00)
Glucose, Bld: 94 mg/dL (ref 70–99)
Potassium: 5.7 mEq/L — ABNORMAL HIGH (ref 3.5–5.1)
Sodium: 138 mEq/L (ref 135–145)

## 2021-08-26 LAB — CBC WITH DIFFERENTIAL/PLATELET
Basophils Absolute: 0.1 10*3/uL (ref 0.0–0.1)
Basophils Relative: 1.9 % (ref 0.0–3.0)
Eosinophils Absolute: 0.3 10*3/uL (ref 0.0–0.7)
Eosinophils Relative: 3.9 % (ref 0.0–5.0)
HCT: 36.6 % (ref 36.0–46.0)
Hemoglobin: 11.9 g/dL — ABNORMAL LOW (ref 12.0–15.0)
Lymphocytes Relative: 25.9 % (ref 12.0–46.0)
Lymphs Abs: 1.8 10*3/uL (ref 0.7–4.0)
MCHC: 32.6 g/dL (ref 30.0–36.0)
MCV: 92.4 fl (ref 78.0–100.0)
Monocytes Absolute: 0.9 10*3/uL (ref 0.1–1.0)
Monocytes Relative: 12.7 % — ABNORMAL HIGH (ref 3.0–12.0)
Neutro Abs: 3.8 10*3/uL (ref 1.4–7.7)
Neutrophils Relative %: 55.6 % (ref 43.0–77.0)
Platelets: 280 10*3/uL (ref 150.0–400.0)
RBC: 3.97 Mil/uL (ref 3.87–5.11)
RDW: 13.9 % (ref 11.5–15.5)
WBC: 6.9 10*3/uL (ref 4.0–10.5)

## 2021-08-27 ENCOUNTER — Other Ambulatory Visit: Payer: Self-pay | Admitting: *Deleted

## 2021-08-27 ENCOUNTER — Other Ambulatory Visit: Payer: Self-pay

## 2021-08-27 DIAGNOSIS — N189 Chronic kidney disease, unspecified: Secondary | ICD-10-CM

## 2021-08-27 NOTE — Patient Outreach (Signed)
  Care Coordination   08/27/2021 Name: Madison Hernandez MRN: [id number] DOB: 1940-01-31   Care Coordination Outreach Attempts:  An unsuccessful telephone outreach was attempted today to offer the patient information about available care coordination services as a benefit of their health plan.   Follow Up Plan:  Additional outreach attempts will be made to offer the patient care coordination information and services.   Encounter Outcome:  No Answer  Care Coordination Interventions Activated:  No   Care Coordination Interventions:  No, not indicated    Raina Mina, RN Care Management Coordinator [place] Office [phone]

## 2021-09-03 ENCOUNTER — Other Ambulatory Visit: Payer: Self-pay | Admitting: Family Medicine

## 2021-09-03 DIAGNOSIS — K5900 Constipation, unspecified: Secondary | ICD-10-CM

## 2021-09-07 ENCOUNTER — Other Ambulatory Visit: Payer: Self-pay | Admitting: *Deleted

## 2021-09-07 NOTE — Patient Outreach (Signed)
  Care Coordination   09/07/2021 Name: Madison Hernandez MRN: [id number] DOB: Nov 16, 1940   Care Coordination Outreach Attempts:  A second unsuccessful outreach was attempted today to offer the patient with information about available care coordination services as a benefit of their health plan.     Follow Up Plan:  Additional outreach attempts will be made to offer the patient care coordination information and services.   Encounter Outcome:  No Answer  Care Coordination Interventions Activated:  No   Care Coordination Interventions:  No, not indicated    Raina Mina, RN Care Management Coordinator [place] Office [phone]

## 2021-09-14 ENCOUNTER — Other Ambulatory Visit (INDEPENDENT_AMBULATORY_CARE_PROVIDER_SITE_OTHER): Payer: Medicare Other

## 2021-09-14 ENCOUNTER — Telehealth: Payer: Self-pay

## 2021-09-14 ENCOUNTER — Other Ambulatory Visit: Payer: Medicare Other

## 2021-09-14 DIAGNOSIS — N189 Chronic kidney disease, unspecified: Secondary | ICD-10-CM

## 2021-09-14 LAB — BASIC METABOLIC PANEL
BUN: 20 mg/dL (ref 6–23)
CO2: 22 mEq/L (ref 19–32)
Calcium: 10.1 mg/dL (ref 8.4–10.5)
Chloride: 101 mEq/L (ref 96–112)
Creatinine, Ser: 1.05 mg/dL (ref 0.40–1.20)
GFR: 50.53 mL/min — ABNORMAL LOW (ref >60.00)
Glucose, Bld: 112 mg/dL — ABNORMAL HIGH (ref 70–99)
Potassium: 4 mEq/L (ref 3.5–5.1)
Sodium: 133 mEq/L — ABNORMAL LOW (ref 135–145)

## 2021-09-14 NOTE — Telephone Encounter (Signed)
This nurse attempted to call patient three times for telephonic AWV. Left message that we will call back to reschedule for another time.

## 2021-09-17 ENCOUNTER — Ambulatory Visit: Payer: Self-pay

## 2021-09-17 NOTE — Patient Outreach (Signed)
  Care Coordination   09/17/2021 Name: Madison Hernandez MRN: [id number] DOB: [birth year]   Care Coordination Outreach Attempts:  A third unsuccessful outreach was attempted today to offer the patient with information about available care coordination services as a benefit of their health plan.   Follow Up Plan:  No further outreach attempts will be made at this time. We have been unable to contact the patient to offer or enroll patient in care coordination services  Encounter Outcome:  No Answer  Care Coordination Interventions Activated:  No   Care Coordination Interventions:  No, not indicated    Daneen Schick, BSW, CDP Social Worker, Certified Dementia Practitioner Care Coordination [phone]

## 2021-09-22 ENCOUNTER — Telehealth: Payer: Self-pay | Admitting: Family Medicine

## 2021-09-22 NOTE — Telephone Encounter (Signed)
Spoke to patient to r/s her AWV. Patient did not want to schedule appointment  declined

## 2021-10-21 DIAGNOSIS — M25561 Pain in right knee: Secondary | ICD-10-CM | POA: Diagnosis not present

## 2021-10-21 DIAGNOSIS — M25562 Pain in left knee: Secondary | ICD-10-CM | POA: Diagnosis not present

## 2021-10-23 ENCOUNTER — Ambulatory Visit (INDEPENDENT_AMBULATORY_CARE_PROVIDER_SITE_OTHER): Payer: Medicare Other | Admitting: Family Medicine

## 2021-10-23 VITALS — BP 140/60 | HR 75 | Temp 97.7°F | Wt 145.0 lb

## 2021-10-23 DIAGNOSIS — R233 Spontaneous ecchymoses: Secondary | ICD-10-CM

## 2021-10-23 DIAGNOSIS — E782 Mixed hyperlipidemia: Secondary | ICD-10-CM | POA: Diagnosis not present

## 2021-10-23 DIAGNOSIS — I10 Essential (primary) hypertension: Secondary | ICD-10-CM

## 2021-10-23 NOTE — Progress Notes (Signed)
Established Patient Office Visit  Subjective   Patient ID: Madison Hernandez, female    DOB: 1940/11/13  Age: 81 y.o. MRN: [id number]  Chief Complaint  Patient presents with   Arm Pain    Intermittent left arm pain describes as sharp, rates at 10 on 0-10 pain scale when she feels it. States she's unable to move it when she feels it. Also has bruises on left arm that have been there x 1 mo.    HPI   Madison Hernandez is seen with some none traumatic ecchymoses on both forearms.  Or at least she does not recall any trauma.  She denies any ecchymosis anywhere else.  She did not complain any to me about any arm pain though she apparently mentioned this to the nurse.  She has a couple of areas of bruising dorsum left forearm and 1 on the right forearm.  Does not take any aspirin.  Does use Advil occasionally.  No anticoagulants.  Denies any nosebleeds, gum bleeds, or hematuria.  No petechiae on her trunk or lower extremities.  She had CBC back in August with normal platelets.  She has hypertension treated with multidrug regimen including chlorthalidone, amlodipine, and benazepril.  She states she is compliant with medications.  Denies any headaches.  No dizziness.  No cough.  She has severe hyperlipidemia and is on rosuvastatin.  She had cholesterol over 400 prior to going on that up and lipids much improved when checked last March.  She had several vaccines recently including flu vaccine at her pharmacy.  Past Medical History:  Diagnosis Date   Acute ischemic colitis ([place]) 1/12   Arthritis    Bleeding ulcer    22 years ago   Blood in stool    Cardiac arrhythmia due to congenital heart disease    Depression    Diverticulitis    Dyslipidemia    Headache(784.0)    Hyperlipidemia    Hypertension    Migraine    UTI (urinary tract infection)    Past Surgical History:  Procedure Laterality Date   bleeding ulcers     22 years ago   vein procedures     right and left lower extremity vein  procedures    reports that she quit smoking about 38 years ago. Her smoking use included cigarettes. She has a 20.00 pack-year smoking history. She has never used smokeless tobacco. She reports that she does not drink alcohol and does not use drugs. family history includes Diabetes in her mother; Heart disease in her mother; Hypertension in her mother and sister. Allergies  Allergen Reactions   Avelox [Moxifloxacin Hcl In Nacl] Shortness Of Breath   Mucinex [Guaifenesin Er] Other (See Comments)    "Makes my heart flutter"   Other     Other reaction(s): Unknown   Sulfa Antibiotics Hives   Albuterol Swelling and Palpitations   Cefdinir Rash    Review of Systems  Constitutional:  Negative for chills, fever, malaise/fatigue and weight loss.  HENT:  Negative for nosebleeds.   Eyes:  Negative for blurred vision.  Respiratory:  Negative for shortness of breath.   Cardiovascular:  Negative for chest pain.  Gastrointestinal:  Negative for abdominal pain.  Genitourinary:  Negative for hematuria.  Skin:  Negative for rash.  Neurological:  Negative for dizziness, weakness and headaches.      Objective:     BP (!) 140/60 (BP Location: Left Arm, Cuff Size: Normal)   Pulse 75   Temp 97.7 F (  36.5 C) (Oral)   Wt 145 lb (65.8 kg)   SpO2 97%   BMI 24.89 kg/m    Physical Exam Vitals reviewed.  Constitutional:      Appearance: She is well-developed.  Eyes:     Pupils: Pupils are equal, round, and reactive to light.  Neck:     Thyroid: No thyromegaly.     Vascular: No JVD.  Cardiovascular:     Rate and Rhythm: Normal rate and regular rhythm.     Heart sounds:     No gallop.  Pulmonary:     Effort: Pulmonary effort is normal. No respiratory distress.     Breath sounds: Normal breath sounds. No wheezing or rales.  Musculoskeletal:     Cervical back: Neck supple.  Skin:    Comments: She has a couple of areas of ecchymosis dorsum left forearm and 1 on the right forearm as well.   No other bruising noted on her lower extremities or trunk.  Neurological:     Mental Status: She is alert.      No results found for any visits on 10/23/21.    The 10-year ASCVD risk score (Arnett DK, et al., 2019) is: 59.2%    Assessment & Plan:   #1 spontaneous/nontraumatic ecchymosis confined to the forearms.  Suspect benign.  She does not take any aspirin or other anticoagulants.  Does not have any generalized bruising.  Recent CBC normal.  Reassurance given.  #2 hypertension.  Initial reading here was slightly elevated and improved some after rest.  Continue current regimen.  Continue low-sodium diet.  Recheck basic metabolic panel at follow-up  #3 history of severe hyperlipidemia.  Patient on Crestor.  Continue low saturated fat diet  No follow-ups on file.    Carolann Littler, MD

## 2021-10-23 NOTE — Patient Instructions (Signed)
Follow up for any generalized bruising or other evidence for bleeding such as nosebleed or gum bleeding.  Avoid aspirin.

## 2021-10-26 DIAGNOSIS — H25813 Combined forms of age-related cataract, bilateral: Secondary | ICD-10-CM | POA: Diagnosis not present

## 2021-10-26 DIAGNOSIS — H35032 Hypertensive retinopathy, left eye: Secondary | ICD-10-CM | POA: Diagnosis not present

## 2021-10-26 DIAGNOSIS — H04123 Dry eye syndrome of bilateral lacrimal glands: Secondary | ICD-10-CM | POA: Diagnosis not present

## 2021-10-26 DIAGNOSIS — H524 Presbyopia: Secondary | ICD-10-CM | POA: Diagnosis not present

## 2021-10-28 ENCOUNTER — Other Ambulatory Visit: Payer: Self-pay | Admitting: Family Medicine

## 2021-10-28 DIAGNOSIS — I10 Essential (primary) hypertension: Secondary | ICD-10-CM

## 2021-10-30 DIAGNOSIS — H25813 Combined forms of age-related cataract, bilateral: Secondary | ICD-10-CM | POA: Diagnosis not present

## 2021-10-30 DIAGNOSIS — H25812 Combined forms of age-related cataract, left eye: Secondary | ICD-10-CM | POA: Diagnosis not present

## 2021-11-24 ENCOUNTER — Other Ambulatory Visit: Payer: Self-pay | Admitting: Family Medicine

## 2021-11-24 DIAGNOSIS — K5900 Constipation, unspecified: Secondary | ICD-10-CM

## 2021-12-16 DIAGNOSIS — H268 Other specified cataract: Secondary | ICD-10-CM | POA: Diagnosis not present

## 2021-12-16 DIAGNOSIS — H25012 Cortical age-related cataract, left eye: Secondary | ICD-10-CM | POA: Diagnosis not present

## 2022-01-08 ENCOUNTER — Other Ambulatory Visit: Payer: Self-pay | Admitting: Family Medicine

## 2022-01-08 DIAGNOSIS — I10 Essential (primary) hypertension: Secondary | ICD-10-CM

## 2022-01-15 ENCOUNTER — Ambulatory Visit (INDEPENDENT_AMBULATORY_CARE_PROVIDER_SITE_OTHER): Payer: Medicare Other | Admitting: Family Medicine

## 2022-01-15 ENCOUNTER — Encounter: Payer: Self-pay | Admitting: Family Medicine

## 2022-01-15 VITALS — BP 128/70 | HR 86 | Temp 97.7°F | Ht 64.0 in | Wt 155.3 lb

## 2022-01-15 DIAGNOSIS — E1165 Type 2 diabetes mellitus with hyperglycemia: Secondary | ICD-10-CM | POA: Diagnosis not present

## 2022-01-15 DIAGNOSIS — E782 Mixed hyperlipidemia: Secondary | ICD-10-CM

## 2022-01-15 DIAGNOSIS — K589 Irritable bowel syndrome without diarrhea: Secondary | ICD-10-CM | POA: Diagnosis not present

## 2022-01-15 DIAGNOSIS — I10 Essential (primary) hypertension: Secondary | ICD-10-CM

## 2022-01-15 DIAGNOSIS — E039 Hypothyroidism, unspecified: Secondary | ICD-10-CM | POA: Diagnosis not present

## 2022-01-15 LAB — POCT GLYCOSYLATED HEMOGLOBIN (HGB A1C): Hemoglobin A1C: 6.2 % — AB (ref 4.0–5.6)

## 2022-01-15 NOTE — Progress Notes (Signed)
Established Patient Office Visit  Subjective   Patient ID: Madison Hernandez, female    DOB: [birth year]  Age: 81 y.o. MRN: [id number]  Chief Complaint  Patient presents with   Follow-up    HPI   Shetara is seen for 68-monthfollow-up.  Her last A1c was back in June and was 6.2.  She tries to watch sugar and starch intake.  She takes extended release metformin 500 mg once daily but no other diabetes medications.  Does not monitor sugars regularly.  No polyuria or polydipsia.  She has hypertension treated with amlodipine 10 mg daily along with chlorthalidone 25 mg daily, and benazepril 20 mg daily.  No recent dizziness.  Blood pressures have been stable.  She takes rosuvastatin 20 mg daily for hyperlipidemia.  She has history of severely elevated lipids in the past.  She initially was unclear whether she was taking the rosuvastatin but she thinks that she is taking this daily.  She has history of constipation predominant IBS.  Takes Linzess daily and GI symptoms currently stable.  Past Medical History:  Diagnosis Date   [place]   Arthritis    Bleeding ulcer    22 years ago   Blood in stool    Cardiac arrhythmia due to congenital heart disease    Depression    Diverticulitis    Dyslipidemia    Headache(784.0)    Hyperlipidemia    Hypertension    Migraine    UTI (urinary tract infection)    Past Surgical History:  Procedure Laterality Date   bleeding ulcers     22 years ago   vein procedures     right and left lower extremity vein procedures    reports that she quit smoking about 39 years ago. Her smoking use included cigarettes. She has a 20.00 pack-year smoking history. She has never used smokeless tobacco. She reports that she does not drink alcohol and does not use drugs. family history includes Diabetes in her mother; Heart disease in her mother; Hypertension in her mother and sister. Allergies  Allergen Reactions   Avelox [Moxifloxacin Hcl In  Nacl] Shortness Of Breath   Mucinex [Guaifenesin Er] Other (See Comments)    "Makes my heart flutter"   Other     Other reaction(s): Unknown   Sulfa Antibiotics Hives   Albuterol Swelling and Palpitations   Cefdinir Rash    Review of Systems  Constitutional:  Negative for malaise/fatigue.  Eyes:  Negative for blurred vision.  Respiratory:  Negative for shortness of breath.   Cardiovascular:  Negative for chest pain.  Gastrointestinal:  Negative for abdominal pain.  Neurological:  Negative for dizziness, weakness and headaches.      Objective:     BP 128/70   Pulse 86   Temp 97.7 F (36.5 C) (Oral)   Ht '5\' 4"'$  (1.626 m)   Wt 155 lb 5 oz (70.4 kg)   SpO2 98%   BMI 26.66 kg/m  BP Readings from Last 3 Encounters:  01/15/22 128/70  10/23/21 (!) 140/60  08/25/21 130/60   Wt Readings from Last 3 Encounters:  01/15/22 155 lb 5 oz (70.4 kg)  10/23/21 145 lb (65.8 kg)  08/25/21 140 lb (63.5 kg)      Physical Exam Vitals reviewed.  Constitutional:      Appearance: She is well-developed.  Eyes:     Pupils: Pupils are equal, round, and reactive to light.  Neck:     Thyroid: No  thyromegaly.     Vascular: No JVD.  Cardiovascular:     Rate and Rhythm: Normal rate and regular rhythm.     Heart sounds:     No gallop.  Pulmonary:     Effort: Pulmonary effort is normal. No respiratory distress.     Breath sounds: Normal breath sounds. No wheezing or rales.  Musculoskeletal:     Cervical back: Neck supple.     Right lower leg: No edema.     Left lower leg: No edema.  Neurological:     Mental Status: She is alert.      Results for orders placed or performed in visit on 01/15/22  POC HgB A1c  Result Value Ref Range   Hemoglobin A1C 6.2 (A) 4.0 - 5.6 %   HbA1c POC (<> result, manual entry)     HbA1c, POC (prediabetic range)     HbA1c, POC (controlled diabetic range)        The 10-year ASCVD risk score (Arnett DK, et al., 2019) is: 52.7%    Assessment & Plan:    #1 type 2 diabetes stable and well-controlled with A1c 6.2%.  Continue metformin extended release 500 mg once daily.  Continue low glycemic diet.  Diet reviewed Recheck A1c within 6 months.  Continue yearly diabetic eye exam  #2 hypertension controlled with amlodipine 10 mg daily and chlorthalidone 25 mg daily and benazepril 20 mg daily.  Recent electrolytes stable.  Continue current dose of medications as above.  #3 mixed hyperlipidemia.  Patient on rosuvastatin 20 mg daily.  Recent lipids much improved.  Patient tolerating rosuvastatin without side effect.  Continue current medication.  Recheck lipids at follow-up  #4 hypothyroidism.  Patient on low-dose levothyroxine 25 mcg daily.  Recheck TSH at 71-monthfollow-up   BCarolann Littler MD

## 2022-01-20 ENCOUNTER — Other Ambulatory Visit: Payer: Self-pay | Admitting: Family Medicine

## 2022-01-20 DIAGNOSIS — M170 Bilateral primary osteoarthritis of knee: Secondary | ICD-10-CM | POA: Diagnosis not present

## 2022-02-07 ENCOUNTER — Other Ambulatory Visit: Payer: Self-pay | Admitting: Family Medicine

## 2022-02-25 DIAGNOSIS — M1711 Unilateral primary osteoarthritis, right knee: Secondary | ICD-10-CM | POA: Diagnosis not present

## 2022-03-04 DIAGNOSIS — M1712 Unilateral primary osteoarthritis, left knee: Secondary | ICD-10-CM | POA: Diagnosis not present

## 2022-03-11 DIAGNOSIS — M170 Bilateral primary osteoarthritis of knee: Secondary | ICD-10-CM | POA: Diagnosis not present

## 2022-03-30 ENCOUNTER — Other Ambulatory Visit: Payer: Self-pay | Admitting: Family Medicine

## 2022-03-30 DIAGNOSIS — K219 Gastro-esophageal reflux disease without esophagitis: Secondary | ICD-10-CM

## 2022-04-06 ENCOUNTER — Encounter: Payer: Self-pay | Admitting: Family Medicine

## 2022-04-06 ENCOUNTER — Ambulatory Visit (INDEPENDENT_AMBULATORY_CARE_PROVIDER_SITE_OTHER): Payer: Medicare Other | Admitting: Family Medicine

## 2022-04-06 VITALS — BP 142/62 | HR 80 | Temp 97.5°F | Ht 64.0 in | Wt 159.6 lb

## 2022-04-06 DIAGNOSIS — L719 Rosacea, unspecified: Secondary | ICD-10-CM

## 2022-04-06 DIAGNOSIS — E1165 Type 2 diabetes mellitus with hyperglycemia: Secondary | ICD-10-CM | POA: Diagnosis not present

## 2022-04-06 DIAGNOSIS — R600 Localized edema: Secondary | ICD-10-CM

## 2022-04-06 DIAGNOSIS — I10 Essential (primary) hypertension: Secondary | ICD-10-CM

## 2022-04-06 DIAGNOSIS — N183 Chronic kidney disease, stage 3 unspecified: Secondary | ICD-10-CM

## 2022-04-06 DIAGNOSIS — E782 Mixed hyperlipidemia: Secondary | ICD-10-CM

## 2022-04-06 LAB — HEPATIC FUNCTION PANEL
ALT: 15 U/L (ref 0–35)
AST: 20 U/L (ref 0–37)
Albumin: 4.7 g/dL (ref 3.5–5.2)
Alkaline Phosphatase: 63 U/L (ref 39–117)
Bilirubin, Direct: 0 mg/dL (ref 0.0–0.3)
Total Bilirubin: 0.4 mg/dL (ref 0.2–1.2)
Total Protein: 8 g/dL (ref 6.0–8.3)

## 2022-04-06 LAB — BASIC METABOLIC PANEL
BUN: 35 mg/dL — ABNORMAL HIGH (ref 6–23)
CO2: 26 mEq/L (ref 19–32)
Calcium: 10.2 mg/dL (ref 8.4–10.5)
Chloride: 96 mEq/L (ref 96–112)
Creatinine, Ser: 1.45 mg/dL — ABNORMAL HIGH (ref 0.40–1.20)
GFR: 34.17 mL/min — ABNORMAL LOW (ref >60.00)
Glucose, Bld: 124 mg/dL — ABNORMAL HIGH (ref 70–99)
Potassium: 4.6 mEq/L (ref 3.5–5.1)
Sodium: 134 mEq/L — ABNORMAL LOW (ref 135–145)

## 2022-04-06 LAB — LIPID PANEL
Cholesterol: 250 mg/dL — ABNORMAL HIGH (ref 0–200)
HDL: 82.9 mg/dL (ref >39.00)
LDL Cholesterol: 143 mg/dL — ABNORMAL HIGH (ref 0–99)
NonHDL: 166.62
Total CHOL/HDL Ratio: 3
Triglycerides: 117 mg/dL (ref 0.0–149.0)
VLDL: 23.4 mg/dL (ref 0.0–40.0)

## 2022-04-06 MED ORDER — KETOCONAZOLE 2 % EX CREA: 1.0000 | TOPICAL_CREAM | Freq: Every day | CUTANEOUS | 2 refills | Status: AC

## 2022-04-06 NOTE — Patient Instructions (Addendum)
Reduce the Amlodipine 10 mg to ONE HALF tablet once daily.   Try to reduce the Aleve.  Elevate legs frequently.   Monitor blood pressure and be in touch if consistently > 140/90

## 2022-04-06 NOTE — Progress Notes (Signed)
Established Patient Office Visit  Subjective   Patient ID: Madison Hernandez, female    DOB: Feb 12, 1940  Age: 82 y.o. MRN: [id number]  Chief Complaint  Patient presents with   Edema    HPI   Madison Hernandez is seen for routine medical follow-up.  Her major complaint today is some bilateral leg edema especially late day.  She has discussed this previously.  She has taken some furosemide intermittently.  She does take about 4 Aleve some days.  No dyspnea.  No orthopnea.  Has not tolerated support hose in the past.  Current blood pressure medication regimen includes amlodipine 10 mg daily, Lotensin 20 mg daily, and chlorthalidone 25 mg daily.  She does have some chronic kidney disease but this has been stable.  No history of heart failure.  Echocardiogram 2018 EF 60 to 65% with grade 2 diastolic dysfunction.  She has diabetes treated with metformin.  A1c has been stable with last A1c 6.2%.  She takes rosuvastatin 20 mg daily for hyperlipidemia.  She had severe elevated lipids in the past.  She is due for follow-up lipids at this time.  Denies any significant myalgias.  She does have arthralgias secondary to osteoarthritis of the knees  Past Medical History:  Diagnosis Date   Acute ischemic colitis ([place]) 1/12   Arthritis    Bleeding ulcer    22 years ago   Blood in stool    Cardiac arrhythmia due to congenital heart disease    Depression    Diverticulitis    Dyslipidemia    Headache(784.0)    Hyperlipidemia    Hypertension    Migraine    UTI (urinary tract infection)    Past Surgical History:  Procedure Laterality Date   bleeding ulcers     22 years ago   vein procedures     right and left lower extremity vein procedures    reports that she quit smoking about 39 years ago. Her smoking use included cigarettes. She has a 20.00 pack-year smoking history. She has never used smokeless tobacco. She reports that she does not drink alcohol and does not use drugs. family history includes  Diabetes in her mother; Heart disease in her mother; Hypertension in her mother and sister. Allergies  Allergen Reactions   Avelox [Moxifloxacin Hcl In Nacl] Shortness Of Breath   Mucinex [Guaifenesin Er] Other (See Comments)    "Makes my heart flutter"   Other     Other reaction(s): Unknown   Sulfa Antibiotics Hives   Albuterol Swelling and Palpitations   Cefdinir Rash    Review of Systems  Constitutional:  Negative for chills, fever and malaise/fatigue.  Eyes:  Negative for blurred vision.  Respiratory:  Negative for cough and shortness of breath.   Cardiovascular:  Positive for leg swelling. Negative for chest pain, orthopnea, claudication and PND.  Neurological:  Negative for dizziness, weakness and headaches.      Objective:     BP (!) 142/62 (BP Location: Left Arm, Cuff Size: Normal)   Pulse 80   Temp (!) 97.5 F (36.4 C) (Oral)   Ht 5\' 4"  (1.626 m)   Wt 159 lb 9.6 oz (72.4 kg)   SpO2 100%   BMI 27.40 kg/m  BP Readings from Last 3 Encounters:  04/06/22 (!) 142/62  01/15/22 128/70  10/23/21 (!) 140/60   Wt Readings from Last 3 Encounters:  04/06/22 159 lb 9.6 oz (72.4 kg)  01/15/22 155 lb 5 oz (70.4 kg)  10/23/21 145  lb (65.8 kg)      Physical Exam Constitutional:      General: She is not in acute distress.    Appearance: She is well-developed.  Eyes:     Pupils: Pupils are equal, round, and reactive to light.  Neck:     Thyroid: No thyromegaly.     Vascular: No JVD.  Cardiovascular:     Rate and Rhythm: Normal rate and regular rhythm.     Heart sounds:     No gallop.  Pulmonary:     Effort: Pulmonary effort is normal. No respiratory distress.     Breath sounds: Normal breath sounds. No wheezing or rales.  Musculoskeletal:     Cervical back: Neck supple.     Comments: Only trace nonpitting edema lower legs bilaterally noted at this time.  Neurological:     Mental Status: She is alert.      No results found for any visits on 04/06/22.  Last  metabolic panel Lab Results  Component Value Date   GLUCOSE 112 (H) 09/14/2021   NA 133 (L) 09/14/2021   K 4.0 09/14/2021   CL 101 09/14/2021   CO2 22 09/14/2021   BUN 20 09/14/2021   CREATININE 1.05 09/14/2021   GFRNONAA 41 (L) 08/05/2016   CALCIUM 10.1 09/14/2021   PROT 7.8 04/15/2021   ALBUMIN 4.9 04/15/2021   BILITOT 0.4 04/15/2021   ALKPHOS 65 04/15/2021   AST 17 04/15/2021   ALT 12 04/15/2021   ANIONGAP 10 08/05/2016   Last lipids Lab Results  Component Value Date   CHOL 174 04/15/2021   HDL 63.70 04/15/2021   LDLCALC 93 04/15/2021   LDLDIRECT 321.0 09/17/2020   TRIG 87.0 04/15/2021   CHOLHDL 3 04/15/2021   Last hemoglobin A1c Lab Results  Component Value Date   HGBA1C 6.2 (A) 01/15/2022   Last thyroid functions Lab Results  Component Value Date   TSH 1.04 04/15/2021      The ASCVD Risk score (Arnett DK, et al., 2019) failed to calculate for the following reasons:   The 2019 ASCVD risk score is only valid for ages 11 to 53    Assessment & Plan:   #1 bilateral leg edema.  Suspect largely secondary to venous stasis and possible component of diastolic dysfunction.  Previous echo as above.  She is on amlodipine 10 mg daily.  Even though her blood pressure was up slightly today after repeat she did not take her medications today and her blood pressure has generally been very well-controlled.  We discussed trying her on lower dose of amlodipine at 5 mg daily and continue her current dose of benazepril and chlorthalidone.  Also elevate legs more frequently.  She has been intolerant of compression stockings in the past.  Set up follow-up in couple months to reassess  #2 history of chronic kidney disease.  Patient states she has been taking a fair amount of Aleve recently and she is encouraged to leave this off.  Recheck basic metabolic panel today  #3 hyperlipidemia treated with rosuvastatin.  Check lipid and hepatic panel.  Continue low saturated fat diet  Return  in about 3 months (around 07/07/2022).    Madison Littler, MD

## 2022-04-07 MED ORDER — ROSUVASTATIN CALCIUM 40 MG PO TABS: 40.0000 mg | ORAL_TABLET | Freq: Every day | ORAL | 0 refills | Status: DC

## 2022-04-07 NOTE — Addendum Note (Signed)
Addended by: Nilda Riggs on: 04/07/2022 10:17 AM   Modules accepted: Orders

## 2022-04-18 ENCOUNTER — Other Ambulatory Visit: Payer: Self-pay | Admitting: Family Medicine

## 2022-04-21 DIAGNOSIS — M170 Bilateral primary osteoarthritis of knee: Secondary | ICD-10-CM | POA: Diagnosis not present

## 2022-05-12 ENCOUNTER — Encounter: Payer: Self-pay | Admitting: Family Medicine

## 2022-05-12 ENCOUNTER — Ambulatory Visit (INDEPENDENT_AMBULATORY_CARE_PROVIDER_SITE_OTHER): Payer: Medicare Other | Admitting: Family Medicine

## 2022-05-12 VITALS — BP 138/64 | HR 78 | Temp 97.4°F | Wt 159.0 lb

## 2022-05-12 DIAGNOSIS — H6122 Impacted cerumen, left ear: Secondary | ICD-10-CM | POA: Diagnosis not present

## 2022-05-12 DIAGNOSIS — R42 Dizziness and giddiness: Secondary | ICD-10-CM | POA: Diagnosis not present

## 2022-05-12 NOTE — Progress Notes (Signed)
   Subjective:    Patient ID: Madison Hernandez, female    DOB: 1940/03/02, 82 y.o.   MRN: [id number]  HPI Here for 6 hours of mild dizziness and dysequilibrium. She woke up with this this morning. She denies any spinning sensations. No headache or sinus congestion. He vision is normal.    Review of Systems  Constitutional: Negative.   HENT:  Positive for hearing loss. Negative for congestion, ear discharge, postnasal drip, sinus pressure and sore throat.   Eyes: Negative.   Respiratory: Negative.    Cardiovascular: Negative.   Neurological:  Positive for dizziness. Negative for light-headedness and headaches.       Objective:   Physical Exam Constitutional:      Appearance: Normal appearance. She is not ill-appearing.  HENT:     Left Ear: There is impacted cerumen.     Ears:     Comments: Right ear canal is clear and the TM is absent     Nose: Nose normal.     Mouth/Throat:     Pharynx: Oropharynx is clear.  Eyes:     Conjunctiva/sclera: Conjunctivae normal.  Cardiovascular:     Rate and Rhythm: Normal rate and regular rhythm.     Pulses: Normal pulses.     Heart sounds: Normal heart sounds.  Pulmonary:     Effort: Pulmonary effort is normal.     Breath sounds: Normal breath sounds.  Neurological:     General: No focal deficit present.     Mental Status: She is alert and oriented to person, place, and time.     Cranial Nerves: No cranial nerve deficit.     Coordination: Coordination normal.     Gait: Gait normal.           Assessment & Plan:  She has some mild dizziness today and this is likely the result of the cerumen impaction. After informed consent was obtained, the left ear canal was irrigated clear with water. She tolerated this well. Afterward she felt better with no dizziness and improved hearing. She will follow up as needed.  Gershon Crane, MD

## 2022-05-17 ENCOUNTER — Encounter: Payer: Self-pay | Admitting: Family Medicine

## 2022-05-17 ENCOUNTER — Ambulatory Visit (INDEPENDENT_AMBULATORY_CARE_PROVIDER_SITE_OTHER): Payer: Medicare Other | Admitting: Family Medicine

## 2022-05-17 VITALS — BP 142/76 | HR 82 | Temp 98.0°F | Wt 157.2 lb

## 2022-05-17 DIAGNOSIS — R110 Nausea: Secondary | ICD-10-CM

## 2022-05-17 DIAGNOSIS — R350 Frequency of micturition: Secondary | ICD-10-CM | POA: Diagnosis not present

## 2022-05-17 DIAGNOSIS — R5381 Other malaise: Secondary | ICD-10-CM | POA: Diagnosis not present

## 2022-05-17 LAB — POCT URINALYSIS DIPSTICK
Bilirubin, UA: NEGATIVE
Blood, UA: NEGATIVE
Glucose, UA: NEGATIVE
Leukocytes, UA: NEGATIVE
Nitrite, UA: NEGATIVE
Protein, UA: NEGATIVE
Spec Grav, UA: 1.015 (ref 1.010–1.025)
Urobilinogen, UA: NEGATIVE E.U./dL — AB
pH, UA: 6 (ref 5.0–8.0)

## 2022-05-17 LAB — POC COVID19 BINAXNOW: SARS Coronavirus 2 Ag: NEGATIVE

## 2022-05-17 NOTE — Progress Notes (Signed)
Established Patient Office Visit   Subjective  Patient ID: Madison Hernandez, female    DOB: 07-03-1940  Age: 82 y.o. MRN: [id number]  Chief Complaint  Patient presents with   Nausea    Last week started swimmy headed, and back pain. Yesterday felt cold, nauseated and all day today, did not take anything, has no appetite, feels yucky. Just wants to sleep.     Pt is an 82 yo female with pmh sig for HTN, migraines, PVCs, IBS, hypothyroidism, DM2, OA, CKD, history of depression who is followed by Dr. Caryl Never and seen for acute concern.  Pt endorses being seen last week for off-balance sensation.  Patient had her left ear cleaning which seem to improve symptoms.  Patient states a few days ago she developed nausea, decreased appetite, feeling cold/chills, right-sided low back pain, urinary frequency, continued off balance, swimmy headed feeling.  Pt has not eaten anything today.  Pt denies h/o DM II.  No longer taking metformin.      ROS Negative unless stated above    Objective:     BP (!) 142/76 (BP Location: Left Arm, Patient Position: Sitting, Cuff Size: Normal)   Pulse 82   Temp 98 F (36.7 C) (Oral)   Wt 157 lb 3.2 oz (71.3 kg)   SpO2 96%   BMI 26.98 kg/m    Physical Exam Constitutional:      General: She is not in acute distress.    Appearance: Normal appearance.  HENT:     Head: Normocephalic and atraumatic.     Nose: Nose normal.     Mouth/Throat:     Mouth: Mucous membranes are moist.  Cardiovascular:     Rate and Rhythm: Normal rate and regular rhythm.     Heart sounds: Normal heart sounds. No murmur heard.    No gallop.  Pulmonary:     Effort: Pulmonary effort is normal. No respiratory distress.     Breath sounds: Normal breath sounds. No wheezing, rhonchi or rales.  Abdominal:     General: Bowel sounds are normal.     Palpations: Abdomen is soft.     Tenderness: There is no abdominal tenderness.  Skin:    General: Skin is warm and dry.   Neurological:     Mental Status: She is alert and oriented to person, place, and time.      Results for orders placed or performed in visit on 05/17/22  POC COVID-19  Result Value Ref Range   SARS Coronavirus 2 Ag Negative Negative  POCT urinalysis dipstick  Result Value Ref Range   Color, UA yellow    Clarity, UA clear    Glucose, UA Negative Negative   Bilirubin, UA neg    Ketones, UA +    Spec Grav, UA 1.015 1.010 - 1.025   Blood, UA neg    pH, UA 6.0 5.0 - 8.0   Protein, UA Negative Negative   Urobilinogen, UA negative (A) 0.2 or 1.0 E.U./dL   Nitrite, UA neg    Leukocytes, UA Negative Negative   Appearance     Odor        Assessment & Plan:  Urinary frequency -     POCT urinalysis dipstick  Nausea -     POC COVID-19 BinaxNow  Malaise  COVID testing negative.  Concern for UTI versus renal calculi.  Obtain UA.  UA with ketones.  Hydration encouraged.  Advance diet as tolerated continue supportive care.  Given strict precautions.  Return if symptoms worsen or fail to improve.   Deeann Saint, MD

## 2022-05-17 NOTE — Patient Instructions (Addendum)
The urine does not have any signs of infection.

## 2022-05-18 DIAGNOSIS — E039 Hypothyroidism, unspecified: Secondary | ICD-10-CM | POA: Diagnosis not present

## 2022-05-18 DIAGNOSIS — R7303 Prediabetes: Secondary | ICD-10-CM | POA: Diagnosis not present

## 2022-05-18 DIAGNOSIS — M791 Myalgia, unspecified site: Secondary | ICD-10-CM | POA: Diagnosis not present

## 2022-05-18 DIAGNOSIS — R5383 Other fatigue: Secondary | ICD-10-CM | POA: Diagnosis not present

## 2022-05-18 DIAGNOSIS — I10 Essential (primary) hypertension: Secondary | ICD-10-CM | POA: Diagnosis not present

## 2022-05-18 DIAGNOSIS — E559 Vitamin D deficiency, unspecified: Secondary | ICD-10-CM | POA: Diagnosis not present

## 2022-05-18 DIAGNOSIS — E785 Hyperlipidemia, unspecified: Secondary | ICD-10-CM | POA: Diagnosis not present

## 2022-05-24 IMAGING — DX DG CHEST 2V
2 series · 2 of 2 positions shown · non-contrast
Comparison: 01/22/2010

CLINICAL DATA: Community acquired pneumonia

EXAM:
CHEST - 2 VIEW

[chest pa]
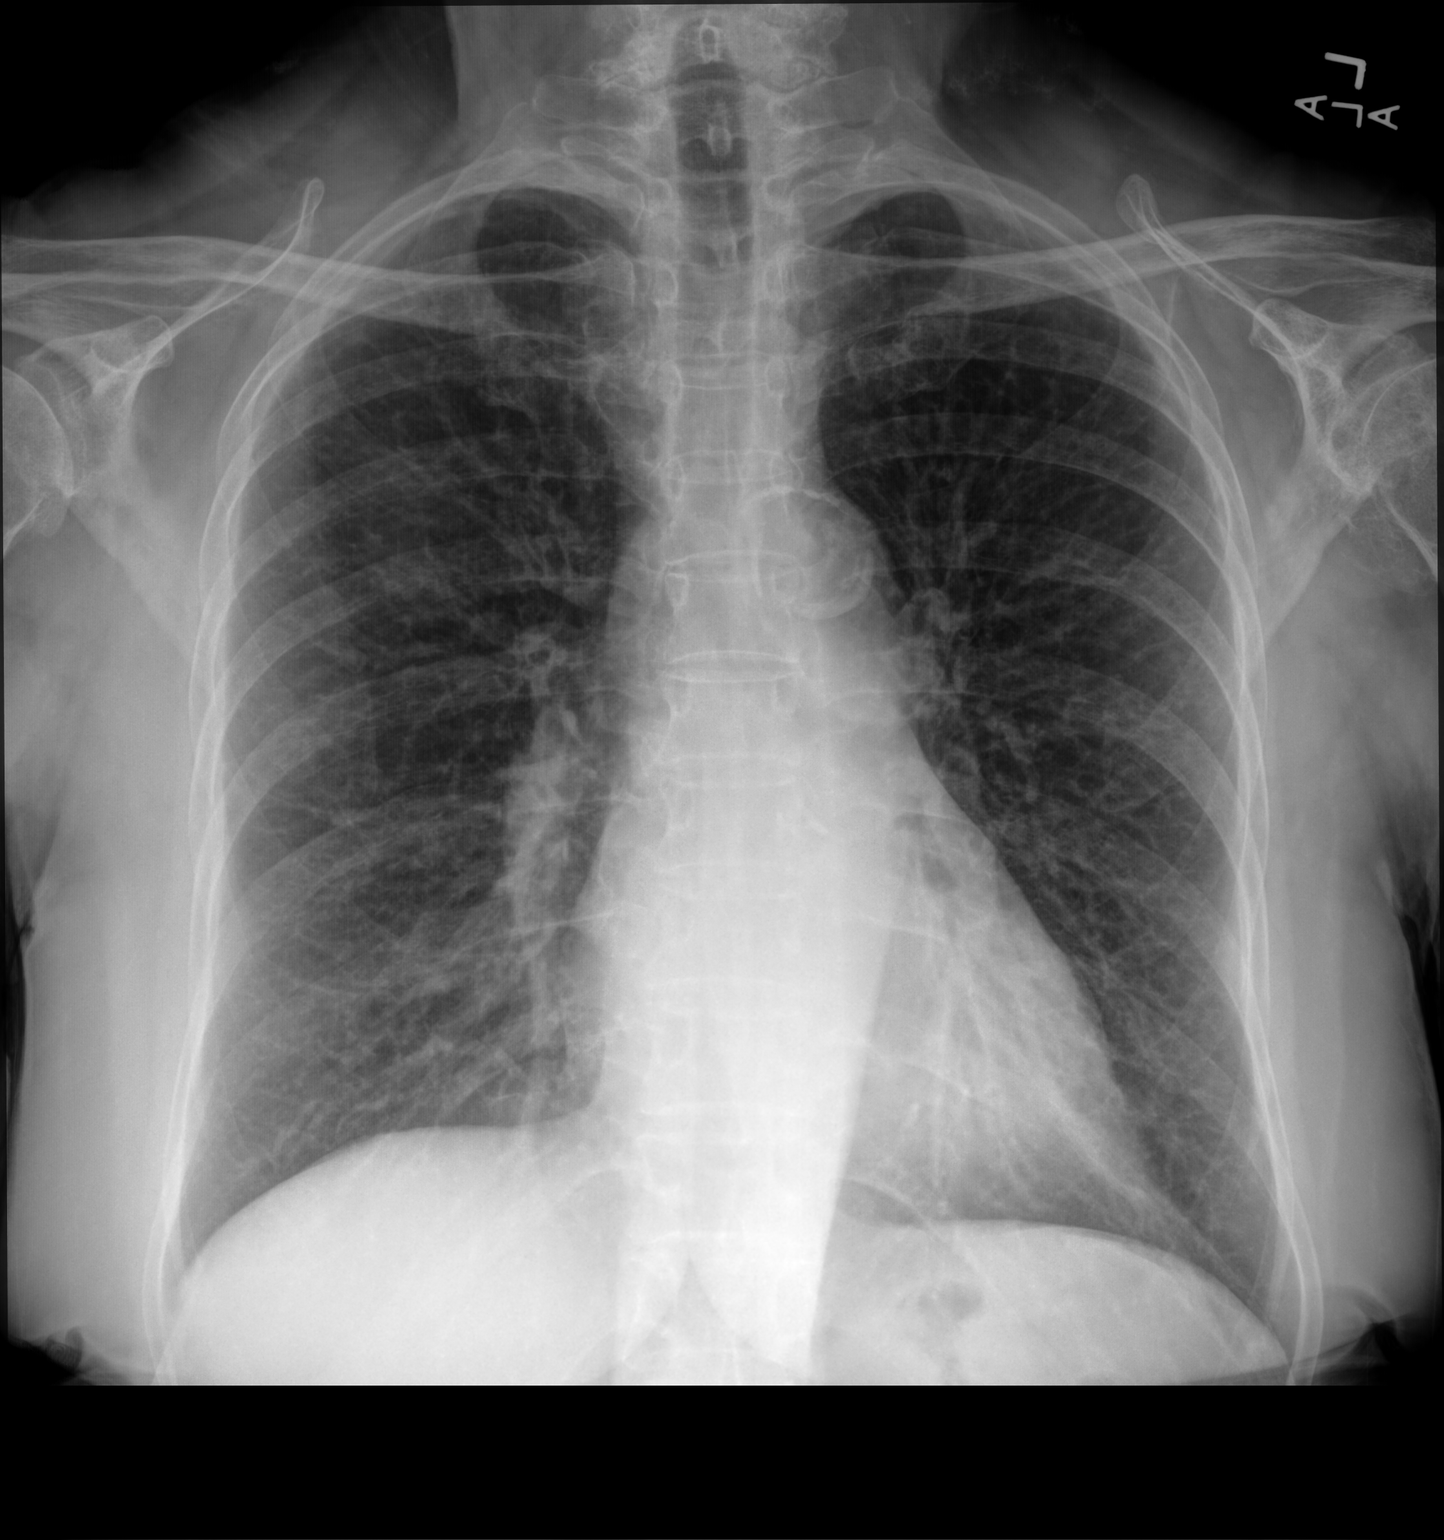

[chest lat]
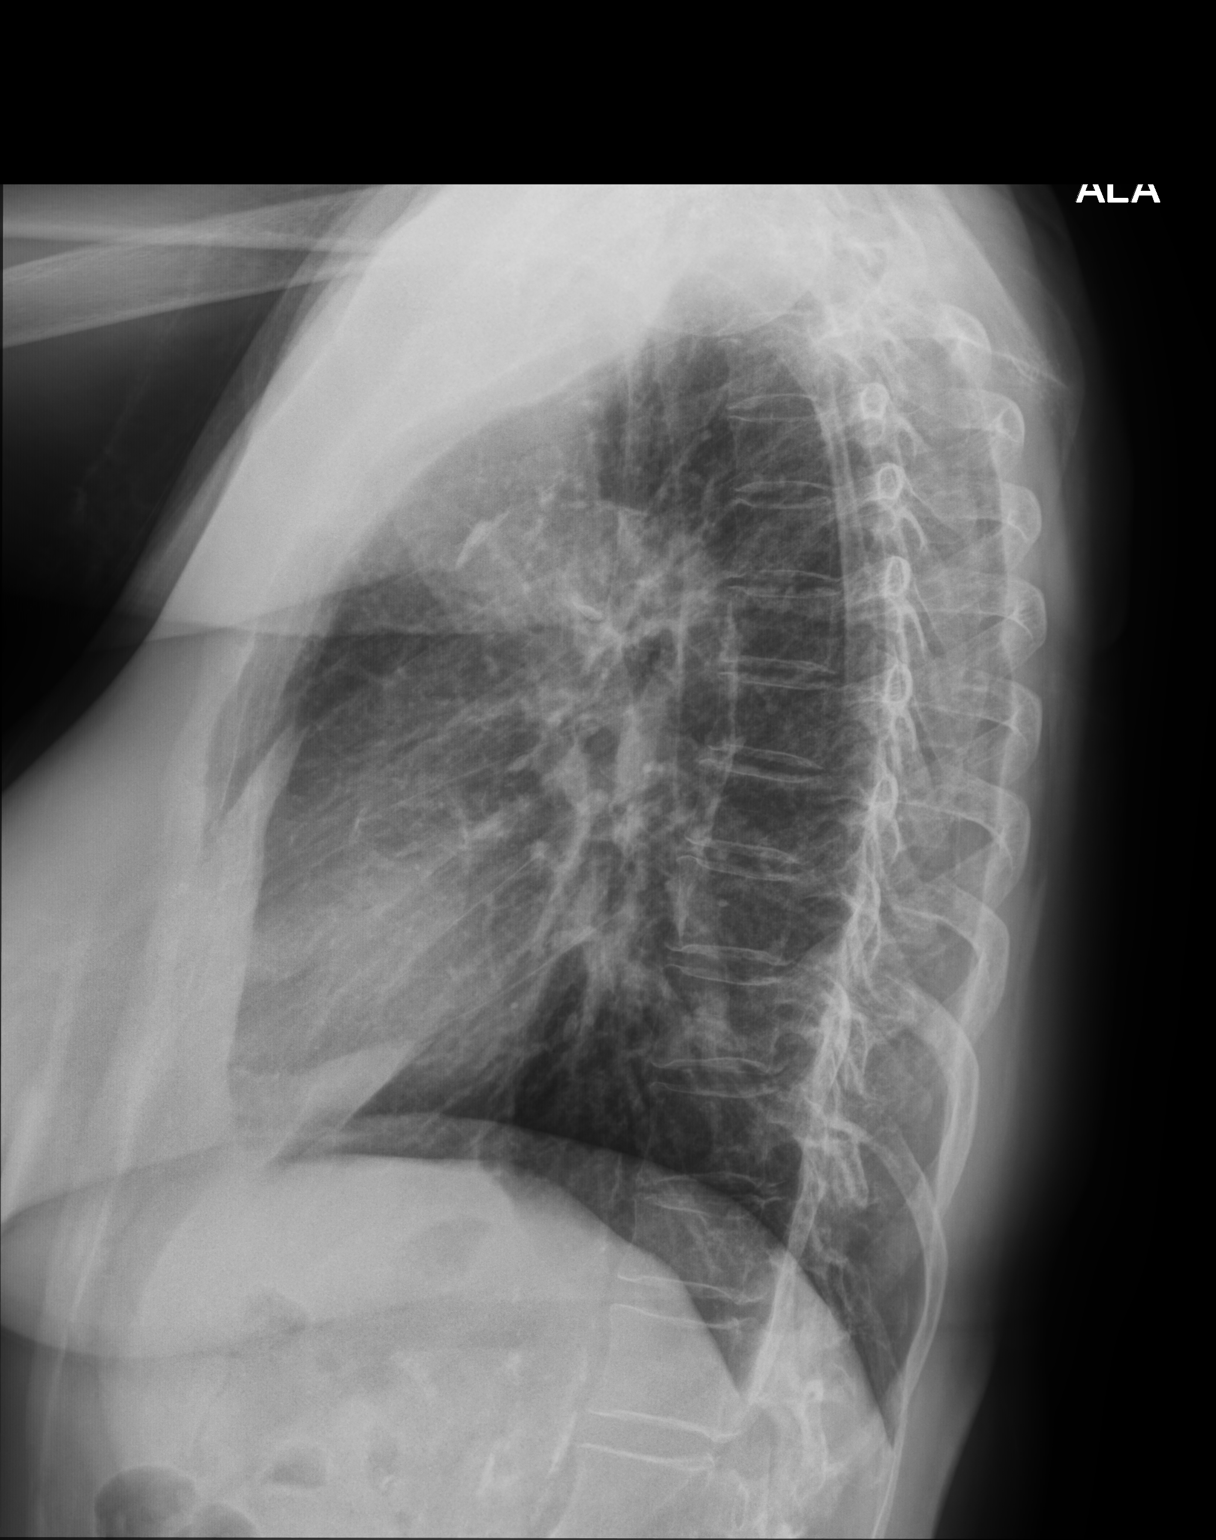

[2 of 2 positions shown; findings below may reference images not displayed]

FINDINGS: Frontal and lateral views of the chest demonstrate an unremarkable
cardiac silhouette. No acute airspace disease, effusion, or
pneumothorax. No acute bony abnormality.
IMPRESSION: 1. No acute intrathoracic process.

## 2022-05-26 ENCOUNTER — Encounter: Payer: Self-pay | Admitting: Family Medicine

## 2022-05-26 ENCOUNTER — Ambulatory Visit (INDEPENDENT_AMBULATORY_CARE_PROVIDER_SITE_OTHER): Payer: Medicare Other | Admitting: Family Medicine

## 2022-05-26 VITALS — BP 144/72 | HR 95 | Temp 97.7°F | Wt 156.2 lb

## 2022-05-26 DIAGNOSIS — R195 Other fecal abnormalities: Secondary | ICD-10-CM | POA: Diagnosis not present

## 2022-05-26 DIAGNOSIS — R58 Hemorrhage, not elsewhere classified: Secondary | ICD-10-CM

## 2022-05-26 DIAGNOSIS — M546 Pain in thoracic spine: Secondary | ICD-10-CM | POA: Diagnosis not present

## 2022-05-26 DIAGNOSIS — G8929 Other chronic pain: Secondary | ICD-10-CM

## 2022-05-26 MED ORDER — TRAMADOL HCL 50 MG PO TABS: 50.0000 mg | ORAL_TABLET | Freq: Two times a day (BID) | ORAL | 0 refills | Status: AC | PRN

## 2022-05-26 NOTE — Progress Notes (Signed)
Established Patient Office Visit   Subjective  Patient ID: Madison Hernandez, female    DOB: 12-03-1940  Age: 82 y.o. MRN: [id number]  Chief Complaint  Patient presents with   Back Pain    Going on for about 8 years, pain doctor has retired. Has been moving to an apartment, using left arm, pain is on left side upper back. Sore to touch, hard to sleep, bothers her id she has to lean against anything. Has tried tramadol, from a while ago, but only had 4 pills. Has not had any problems in about 2 years. Would like to go over blood work she had done at [facility], was informed she was anemic, and is bruising more    Patient is an 82 year old female with pmh sig for HTN, PVCs, migraines, IVF, hypothyroidism, DM 2, OA, CKD, HLD, history of depression, history of fatigue who is followed by Dr. Caryl Never and seen for ongoing concern.  Patient endorses still feeling fatigued, weak, and back pain.  Back pain is in the left lateral mid thoracic area.  Patient states this is a chronic pain that occasionally flares.  Previously seen by pain management several years ago but provider retired.  Patient states she took leftover tramadol and would like a refill.  Patient also mentions being seen at [facility] after last OFV.  States was told her blood sugar was elevated at 169, hemoglobin A1c was 6.5%, she was anemic and something about sodium.  Given Rx for vitamin D.  Patient states they were supposed to send results to the clinic.  Patient also notes bruising on upper extremities.  Moved apartments last week but denies heavy lifting or bumping into anything.  Patient taking 4 Aleve per day times years for chronic pain.  Endorses dark stools but states is related to Linzess use daily.  Back Pain    Past Medical History:  Diagnosis Date   Acute ischemic colitis (HCC) 1/12   Arthritis    Bleeding ulcer    22 years ago   Blood in stool    Cardiac arrhythmia due to congenital heart disease    Depression     Diverticulitis    Dyslipidemia    Headache(784.0)    Hyperlipidemia    Hypertension    Migraine    UTI (urinary tract infection)    Social History   Tobacco Use   Smoking status: Former    Packs/day: 2.00    Years: 10.00    Additional pack years: 0.00    Total pack years: 20.00    Types: Cigarettes    Quit date: 01/19/1983    Years since quitting: 39.3   Smokeless tobacco: Never  Vaping Use   Vaping Use: Never used  Substance Use Topics   Alcohol use: No   Drug use: No   Family History  Problem Relation Age of Onset   Heart disease Mother        age 22   Diabetes Mother    Hypertension Mother    Hypertension Sister    Allergies  Allergen Reactions   Avelox [Moxifloxacin Hcl In Nacl] Shortness Of Breath   Mucinex [Guaifenesin Er] Other (See Comments)    "Makes my heart flutter"   Other     Other reaction(s): Unknown   Sulfa Antibiotics Hives   Albuterol Swelling and Palpitations   Cefdinir Rash      Review of Systems  Musculoskeletal:  Positive for back pain.   Negative unless stated above  Objective:     BP (!) 144/72 (BP Location: Left Arm, Patient Position: Sitting, Cuff Size: Normal)   Pulse 95   Temp 97.7 F (36.5 C) (Oral)   Wt 156 lb 3.2 oz (70.9 kg)   SpO2 97%   BMI 26.81 kg/m    Physical Exam Constitutional:      General: She is not in acute distress.    Appearance: Normal appearance.  HENT:     Head: Normocephalic and atraumatic.     Nose: Nose normal.     Mouth/Throat:     Mouth: Mucous membranes are moist.  Cardiovascular:     Rate and Rhythm: Normal rate and regular rhythm.     Heart sounds: Normal heart sounds. No murmur heard.    No gallop.  Pulmonary:     Effort: Pulmonary effort is normal. No respiratory distress.     Breath sounds: Normal breath sounds. No wheezing, rhonchi or rales.  Musculoskeletal:       Arms:     Comments: TTP of lateral L thoracic spine.    Skin:    General: Skin is warm and dry.   Neurological:     Mental Status: She is alert and oriented to person, place, and time.      No results found for any visits on 05/26/22.    Assessment & Plan:  Chronic left-sided thoracic back pain -     traMADol HCl; Take 1 tablet (50 mg total) by mouth every 12 (twelve) hours as needed for up to 3 days.  Dispense: 6 tablet; Refill: 0  Ecchymosis  Dark stools  Labs from UC unavailable.  Offered patient records release form, however she declines at this time.  States will pick up records.  Recent back pain likely musculoskeletal 2/2 muscle strain from moving/overuse.  Would avoid muscle relaxer as may increase fall risk.  Given limited supply of tramadol.  Needs to follow-up with pain management for continued or worsening symptoms.  Advised t overuse of NSAIDs can cause GIB.  Advised to follow-up with GI for question of dark stools.  Return if symptoms worsen or fail to improve.   Deeann Saint, MD

## 2022-05-26 NOTE — Patient Instructions (Signed)
A limited supply of tramadol was sent to your pharmacy.  You will need to follow-up with your pain medicine doctors for further refills.  No records were sent over from your urgent care visit a few days ago.  You can sign a records release form to have them send this information to clinic.

## 2022-06-03 ENCOUNTER — Other Ambulatory Visit: Payer: Self-pay | Admitting: Family Medicine

## 2022-06-03 DIAGNOSIS — I10 Essential (primary) hypertension: Secondary | ICD-10-CM

## 2022-06-08 ENCOUNTER — Other Ambulatory Visit: Payer: Self-pay | Admitting: Family Medicine

## 2022-06-28 ENCOUNTER — Telehealth: Payer: Self-pay | Admitting: Family Medicine

## 2022-06-28 ENCOUNTER — Encounter: Payer: Self-pay | Admitting: *Deleted

## 2022-06-28 NOTE — Progress Notes (Signed)
[facility] Quality Team Note  Name: Madison Hernandez Date of Birth: 1940-08-26 MRN: [id number] Date: 06/28/2022  [facility] Quality Team has reviewed this patient's chart, please see recommendations below:  Pt has open gap for KED measure.  Would need Urine Albumin Creatinine Ratio test to close gap.  Pt has 07/07/22 ov. Would provider be able to order at ov?

## 2022-06-28 NOTE — Telephone Encounter (Signed)
I spoke with the patient and she reported that she has not seen Dr. Elnoria Howard since 2017. Patient reported that she was getting this prescription filled through PCP previously.

## 2022-06-28 NOTE — Telephone Encounter (Signed)
Pt states her insurance no longer covers LINZESS 145 MCG CAPS capsule and they are asking if there is a substitution.

## 2022-06-29 NOTE — Telephone Encounter (Signed)
Patient informed of the message below and voiced understanding  

## 2022-07-02 ENCOUNTER — Other Ambulatory Visit: Payer: Self-pay | Admitting: Family Medicine

## 2022-07-07 ENCOUNTER — Ambulatory Visit (INDEPENDENT_AMBULATORY_CARE_PROVIDER_SITE_OTHER): Payer: Medicare Other | Admitting: Family Medicine

## 2022-07-07 ENCOUNTER — Encounter: Payer: Self-pay | Admitting: Family Medicine

## 2022-07-07 VITALS — BP 170/82 | HR 70 | Temp 97.6°F | Wt 157.0 lb

## 2022-07-07 DIAGNOSIS — E1165 Type 2 diabetes mellitus with hyperglycemia: Secondary | ICD-10-CM | POA: Diagnosis not present

## 2022-07-07 DIAGNOSIS — Z7984 Long term (current) use of oral hypoglycemic drugs: Secondary | ICD-10-CM | POA: Diagnosis not present

## 2022-07-07 DIAGNOSIS — N183 Chronic kidney disease, stage 3 unspecified: Secondary | ICD-10-CM

## 2022-07-07 DIAGNOSIS — I10 Essential (primary) hypertension: Secondary | ICD-10-CM | POA: Diagnosis not present

## 2022-07-07 DIAGNOSIS — R739 Hyperglycemia, unspecified: Secondary | ICD-10-CM

## 2022-07-07 LAB — LIPID PANEL
Cholesterol: 203 mg/dL — ABNORMAL HIGH (ref 0–200)
HDL: 72.4 mg/dL (ref >39.00)
LDL Cholesterol: 100 mg/dL — ABNORMAL HIGH (ref 0–99)
NonHDL: 130.84
Total CHOL/HDL Ratio: 3
Triglycerides: 156 mg/dL — ABNORMAL HIGH (ref 0.0–149.0)
VLDL: 31.2 mg/dL (ref 0.0–40.0)

## 2022-07-07 LAB — BASIC METABOLIC PANEL
BUN: 25 mg/dL — ABNORMAL HIGH (ref 6–23)
CO2: 24 mEq/L (ref 19–32)
Calcium: 10.3 mg/dL (ref 8.4–10.5)
Chloride: 102 mEq/L (ref 96–112)
Creatinine, Ser: 1.27 mg/dL — ABNORMAL HIGH (ref 0.40–1.20)
GFR: 39.99 mL/min — ABNORMAL LOW (ref >60.00)
Glucose, Bld: 120 mg/dL — ABNORMAL HIGH (ref 70–99)
Potassium: 4.7 mEq/L (ref 3.5–5.1)
Sodium: 136 mEq/L (ref 135–145)

## 2022-07-07 LAB — MICROALBUMIN / CREATININE URINE RATIO
Creatinine,U: 70.4 mg/dL
Microalb Creat Ratio: 1.6 mg/g (ref 0.0–30.0)
Microalb, Ur: 1.1 mg/dL (ref 0.0–1.9)

## 2022-07-07 LAB — POCT GLYCOSYLATED HEMOGLOBIN (HGB A1C): Hemoglobin A1C: 6.4 % — AB (ref 4.0–5.6)

## 2022-07-07 MED ORDER — ROSUVASTATIN CALCIUM 40 MG PO TABS: 40.0000 mg | ORAL_TABLET | Freq: Every day | ORAL | 1 refills | Status: DC

## 2022-07-07 MED ORDER — ROSUVASTATIN CALCIUM 40 MG PO TABS: 40.0000 mg | ORAL_TABLET | Freq: Every day | ORAL | 3 refills | Status: DC

## 2022-07-07 NOTE — Patient Instructions (Addendum)
Go back to full tablet of Amlodipine 10 mg daily  Try to reduce Aleve use- this can worsen kidney function

## 2022-07-07 NOTE — Progress Notes (Signed)
Established Patient Office Visit  Subjective   Patient ID: Madison Hernandez, female    DOB: Oct 24, 1940  Age: 82 y.o. MRN: [id number]  Chief Complaint  Patient presents with   Medical Management of Chronic Issues    HPI   Madison Hernandez has history of hypertension, migraine headaches, PVCs, IBS, hypothyroidism, type 2 diabetes, chronic kidney disease, osteoarthritis.  She is seen today for several following issues  Type 2 diabetes.  Last A1c 6.2%.  No recent dietary changes.  Not monitoring blood sugars regularly.  No polyuria or polydipsia.  Hypertension history.  Has occasional mild lower extremity edema.  She started bilateral support hose which seems to help.  We did recently reduce her amlodipine from 10 mg to 5 mg but blood pressure back up today.  No dyspnea or orthopnea.  She has osteoarthritis involving multiple joints.  Takes Tylenol and sometimes supplements with Ultram.  She was seen physiatrist who recently retired.  She infrequently has taken tramadol which helps and would like for [place] to take that over.  Does not take this daily or regularly.  She had labs back in March her creatinine was up to 1.45 with GFR of 34.  We had recommended follow-up kidney function at this time.  She does states she takes frequently up to 4 Aleve at 1 time over-the-counter.  Past Medical History:  Diagnosis Date   Acute ischemic colitis (HCC) 1/12   Arthritis    Bleeding ulcer    22 years ago   Blood in stool    Cardiac arrhythmia due to congenital heart disease    Depression    Diverticulitis    Dyslipidemia    Headache(784.0)    Hyperlipidemia    Hypertension    Migraine    UTI (urinary tract infection)    Past Surgical History:  Procedure Laterality Date   bleeding ulcers     22 years ago   vein procedures     right and left lower extremity vein procedures    reports that she quit smoking about 39 years ago. Her smoking use included cigarettes. She has a 20.00 pack-year smoking  history. She has never used smokeless tobacco. She reports that she does not drink alcohol and does not use drugs. family history includes Diabetes in her mother; Heart disease in her mother; Hypertension in her mother and sister. Allergies  Allergen Reactions   Avelox [Moxifloxacin Hcl In Nacl] Shortness Of Breath   Mucinex [Guaifenesin Er] Other (See Comments)    "Makes my heart flutter"   Other     Other reaction(s): Unknown   Sulfa Antibiotics Hives   Albuterol Swelling and Palpitations   Cefdinir Rash    Review of Systems  Constitutional:  Negative for malaise/fatigue.  Eyes:  Negative for blurred vision.  Respiratory:  Negative for shortness of breath.   Cardiovascular:  Positive for leg swelling. Negative for chest pain.  Genitourinary:  Negative for dysuria.  Neurological:  Negative for dizziness, weakness and headaches.      Objective:     BP (!) 170/82   Pulse 70   Temp 97.6 F (36.4 C) (Oral)   Wt 157 lb (71.2 kg)   SpO2 99%   BMI 26.95 kg/m  BP Readings from Last 3 Encounters:  07/07/22 (!) 170/82  05/26/22 (!) 144/72  05/17/22 (!) 142/76   Wt Readings from Last 3 Encounters:  07/07/22 157 lb (71.2 kg)  05/26/22 156 lb 3.2 oz (70.9 kg)  05/17/22 157 lb  3.2 oz (71.3 kg)      Physical Exam Vitals reviewed.  Constitutional:      Appearance: Normal appearance.  Cardiovascular:     Rate and Rhythm: Normal rate and regular rhythm.  Pulmonary:     Effort: Pulmonary effort is normal.     Breath sounds: Normal breath sounds. No wheezing or rales.  Musculoskeletal:     Comments: Show support hose on bilaterally but no pitting edema at this time  Neurological:     General: No focal deficit present.     Mental Status: She is alert.      Results for orders placed or performed in visit on 07/07/22  POC HgB A1c  Result Value Ref Range   Hemoglobin A1C 6.4 (A) 4.0 - 5.6 %   HbA1c POC (<> result, manual entry)     HbA1c, POC (prediabetic range)      HbA1c, POC (controlled diabetic range)      Last metabolic panel Lab Results  Component Value Date   GLUCOSE 124 (H) 04/06/2022   NA 134 (L) 04/06/2022   K 4.6 04/06/2022   CL 96 04/06/2022   CO2 26 04/06/2022   BUN 35 (H) 04/06/2022   CREATININE 1.45 (H) 04/06/2022   GFRNONAA 41 (L) 08/05/2016   CALCIUM 10.2 04/06/2022   PROT 8.0 04/06/2022   ALBUMIN 4.7 04/06/2022   BILITOT 0.4 04/06/2022   ALKPHOS 63 04/06/2022   AST 20 04/06/2022   ALT 15 04/06/2022   ANIONGAP 10 08/05/2016   Last lipids Lab Results  Component Value Date   CHOL 250 (H) 04/06/2022   HDL 82.90 04/06/2022   LDLCALC 143 (H) 04/06/2022   LDLDIRECT 321.0 09/17/2020   TRIG 117.0 04/06/2022   CHOLHDL 3 04/06/2022   Last hemoglobin A1c Lab Results  Component Value Date   HGBA1C 6.4 (A) 07/07/2022      The ASCVD Risk score (Arnett DK, et al., 2019) failed to calculate for the following reasons:   The 2019 ASCVD risk score is only valid for ages 68 to 11    Assessment & Plan:   #1 type 2 diabetes stable with A1c today 6.4%.  Continue low glycemic diet.  Continue metformin.  Check urine microalbumin  #2 hypertension poorly controlled.  This may reflect recent reduction in dosage of amlodipine.  She feels like her edema is improved at this time we will try going back to 10 mg amlodipine which seems to be working better for her in combination with chlorthalidone and benazepril.  Continue low-sodium diet.  Reassess in 1 month  #3 chronic kidney disease stage III.  Recent worsening.  She was cautioned about not using Aleve or any other over-the-counter nonsteroidal regularly.  Stay well-hydrated.  Recheck basic metabolic panel today.  #4 osteoarthritis involving multiple joints.  Recommend trial of Tylenol and supplement with tramadol as needed.  As above, avoid regular use of nonsteroidals.   Return in about 1 month (around 08/06/2022).    Evelena Peat, MD

## 2022-07-13 ENCOUNTER — Other Ambulatory Visit: Payer: Self-pay | Admitting: Family Medicine

## 2022-07-24 ENCOUNTER — Other Ambulatory Visit: Payer: Self-pay | Admitting: Family Medicine

## 2022-08-12 ENCOUNTER — Other Ambulatory Visit: Payer: Self-pay | Admitting: Family Medicine

## 2022-08-16 DIAGNOSIS — M170 Bilateral primary osteoarthritis of knee: Secondary | ICD-10-CM | POA: Diagnosis not present

## 2022-08-16 DIAGNOSIS — L821 Other seborrheic keratosis: Secondary | ICD-10-CM | POA: Diagnosis not present

## 2022-08-25 DIAGNOSIS — R051 Acute cough: Secondary | ICD-10-CM | POA: Diagnosis not present

## 2022-08-25 DIAGNOSIS — J209 Acute bronchitis, unspecified: Secondary | ICD-10-CM | POA: Diagnosis not present

## 2022-08-30 DIAGNOSIS — J019 Acute sinusitis, unspecified: Secondary | ICD-10-CM | POA: Diagnosis not present

## 2022-08-30 DIAGNOSIS — R051 Acute cough: Secondary | ICD-10-CM | POA: Diagnosis not present

## 2022-09-02 ENCOUNTER — Other Ambulatory Visit: Payer: Self-pay | Admitting: Family Medicine

## 2022-09-09 ENCOUNTER — Telehealth: Payer: Self-pay | Admitting: Family Medicine

## 2022-09-09 NOTE — Telephone Encounter (Signed)
[facility] called to ask MD if it would be ok to change the manufacturer for the Levothyroxine?  From: Amneal to Lupin  [phone] Ref:  [id number]

## 2022-09-10 NOTE — Telephone Encounter (Signed)
Madison Hernandez. pharmacist with [facility] informed of the message below

## 2022-09-11 DIAGNOSIS — R059 Cough, unspecified: Secondary | ICD-10-CM | POA: Diagnosis not present

## 2022-09-11 DIAGNOSIS — J019 Acute sinusitis, unspecified: Secondary | ICD-10-CM | POA: Diagnosis not present

## 2022-09-30 ENCOUNTER — Other Ambulatory Visit: Payer: Self-pay | Admitting: Family Medicine

## 2022-09-30 LAB — MICROALBUMIN / CREATININE URINE RATIO: Microalb Creat Ratio: 29.99

## 2022-10-07 ENCOUNTER — Other Ambulatory Visit: Payer: Self-pay | Admitting: Family Medicine

## 2022-10-07 DIAGNOSIS — K5900 Constipation, unspecified: Secondary | ICD-10-CM

## 2022-10-14 DIAGNOSIS — M170 Bilateral primary osteoarthritis of knee: Secondary | ICD-10-CM | POA: Diagnosis not present

## 2022-10-21 DIAGNOSIS — M170 Bilateral primary osteoarthritis of knee: Secondary | ICD-10-CM | POA: Diagnosis not present

## 2022-10-24 DIAGNOSIS — I10 Essential (primary) hypertension: Secondary | ICD-10-CM | POA: Diagnosis not present

## 2022-10-24 DIAGNOSIS — R350 Frequency of micturition: Secondary | ICD-10-CM | POA: Diagnosis not present

## 2022-10-24 DIAGNOSIS — N390 Urinary tract infection, site not specified: Secondary | ICD-10-CM | POA: Diagnosis not present

## 2022-10-24 DIAGNOSIS — R300 Dysuria: Secondary | ICD-10-CM | POA: Diagnosis not present

## 2022-10-28 DIAGNOSIS — M170 Bilateral primary osteoarthritis of knee: Secondary | ICD-10-CM | POA: Diagnosis not present

## 2022-11-07 ENCOUNTER — Other Ambulatory Visit: Payer: Self-pay | Admitting: Family Medicine

## 2022-11-17 DIAGNOSIS — M170 Bilateral primary osteoarthritis of knee: Secondary | ICD-10-CM | POA: Diagnosis not present

## 2022-11-18 ENCOUNTER — Telehealth: Payer: Self-pay | Admitting: Family Medicine

## 2022-11-18 DIAGNOSIS — M546 Pain in thoracic spine: Secondary | ICD-10-CM | POA: Diagnosis not present

## 2022-11-18 NOTE — Telephone Encounter (Signed)
Patient called in a lot of back pain, would like to get Tramadol, patient stated you would order for her if she was in bad pain.  [id number] - [place], [place] - [address] Phone: [phone]  Fax: [phone]     Patient also transferred to Triage.

## 2022-11-18 NOTE — Telephone Encounter (Signed)
Triage nurse Arline Asp called back stating recommending pt be seen within 4 hour by PCP for back pain, patient stated she was in too much pain to go anywhere.

## 2022-11-19 DIAGNOSIS — R21 Rash and other nonspecific skin eruption: Secondary | ICD-10-CM | POA: Diagnosis not present

## 2022-11-19 MED ORDER — TRAMADOL HCL 50 MG PO TABS: 50.0000 mg | ORAL_TABLET | Freq: Four times a day (QID) | ORAL | 0 refills | Status: AC | PRN

## 2022-11-19 NOTE — Telephone Encounter (Signed)
Let her know I sent in some limited tramadol.  Kristian Covey MD [facility]

## 2022-11-19 NOTE — Telephone Encounter (Signed)
Left detailed message on patient's vm informing her of the message below.

## 2022-11-21 DIAGNOSIS — M549 Dorsalgia, unspecified: Secondary | ICD-10-CM | POA: Diagnosis not present

## 2022-11-21 DIAGNOSIS — R350 Frequency of micturition: Secondary | ICD-10-CM | POA: Diagnosis not present

## 2022-11-22 DIAGNOSIS — Z79899 Other long term (current) drug therapy: Secondary | ICD-10-CM | POA: Diagnosis not present

## 2022-11-22 DIAGNOSIS — M542 Cervicalgia: Secondary | ICD-10-CM | POA: Diagnosis not present

## 2022-11-22 DIAGNOSIS — M170 Bilateral primary osteoarthritis of knee: Secondary | ICD-10-CM | POA: Diagnosis not present

## 2022-11-22 DIAGNOSIS — M546 Pain in thoracic spine: Secondary | ICD-10-CM | POA: Diagnosis not present

## 2022-11-24 DIAGNOSIS — M79605 Pain in left leg: Secondary | ICD-10-CM | POA: Diagnosis not present

## 2022-11-24 DIAGNOSIS — I83892 Varicose veins of left lower extremities with other complications: Secondary | ICD-10-CM | POA: Diagnosis not present

## 2022-11-24 DIAGNOSIS — R600 Localized edema: Secondary | ICD-10-CM | POA: Diagnosis not present

## 2022-11-24 DIAGNOSIS — M79662 Pain in left lower leg: Secondary | ICD-10-CM | POA: Diagnosis not present

## 2022-11-28 DIAGNOSIS — L0103 Bullous impetigo: Secondary | ICD-10-CM | POA: Diagnosis not present

## 2022-12-01 DIAGNOSIS — L0103 Bullous impetigo: Secondary | ICD-10-CM | POA: Diagnosis not present

## 2022-12-14 DIAGNOSIS — I83892 Varicose veins of left lower extremities with other complications: Secondary | ICD-10-CM | POA: Diagnosis not present

## 2022-12-23 DIAGNOSIS — M542 Cervicalgia: Secondary | ICD-10-CM | POA: Diagnosis not present

## 2022-12-23 DIAGNOSIS — M170 Bilateral primary osteoarthritis of knee: Secondary | ICD-10-CM | POA: Diagnosis not present

## 2022-12-23 DIAGNOSIS — M546 Pain in thoracic spine: Secondary | ICD-10-CM | POA: Diagnosis not present

## 2022-12-28 ENCOUNTER — Ambulatory Visit (INDEPENDENT_AMBULATORY_CARE_PROVIDER_SITE_OTHER): Payer: Medicare Other | Admitting: Family Medicine

## 2022-12-28 ENCOUNTER — Encounter: Payer: Self-pay | Admitting: Family Medicine

## 2022-12-28 VITALS — BP 130/60 | HR 90 | Temp 97.8°F | Ht 64.0 in | Wt 154.7 lb

## 2022-12-28 DIAGNOSIS — N289 Disorder of kidney and ureter, unspecified: Secondary | ICD-10-CM | POA: Diagnosis not present

## 2022-12-28 DIAGNOSIS — I10 Essential (primary) hypertension: Secondary | ICD-10-CM | POA: Diagnosis not present

## 2022-12-28 DIAGNOSIS — R5383 Other fatigue: Secondary | ICD-10-CM

## 2022-12-28 DIAGNOSIS — E039 Hypothyroidism, unspecified: Secondary | ICD-10-CM

## 2022-12-28 DIAGNOSIS — E1165 Type 2 diabetes mellitus with hyperglycemia: Secondary | ICD-10-CM

## 2022-12-28 LAB — BASIC METABOLIC PANEL
BUN: 67 mg/dL — ABNORMAL HIGH (ref 6–23)
CO2: 24 meq/L (ref 19–32)
Calcium: 9.8 mg/dL (ref 8.4–10.5)
Chloride: 102 meq/L (ref 96–112)
Creatinine, Ser: 2.21 mg/dL — ABNORMAL HIGH (ref 0.40–1.20)
GFR: 20.5 mL/min — ABNORMAL LOW (ref >60.00)
Glucose, Bld: 126 mg/dL — ABNORMAL HIGH (ref 70–99)
Potassium: 4.6 meq/L (ref 3.5–5.1)
Sodium: 138 meq/L (ref 135–145)

## 2022-12-28 LAB — CBC WITH DIFFERENTIAL/PLATELET
Basophils Absolute: 0.1 10*3/uL (ref 0.0–0.1)
Basophils Relative: 1.2 % (ref 0.0–3.0)
Eosinophils Absolute: 0.2 10*3/uL (ref 0.0–0.7)
Eosinophils Relative: 2.1 % (ref 0.0–5.0)
HCT: 36.1 % (ref 36.0–46.0)
Hemoglobin: 11.8 g/dL — ABNORMAL LOW (ref 12.0–15.0)
Lymphocytes Relative: 23.3 % (ref 12.0–46.0)
Lymphs Abs: 1.9 10*3/uL (ref 0.7–4.0)
MCHC: 32.7 g/dL (ref 30.0–36.0)
MCV: 91.4 fL (ref 78.0–100.0)
Monocytes Absolute: 0.6 10*3/uL (ref 0.1–1.0)
Monocytes Relative: 7.4 % (ref 3.0–12.0)
Neutro Abs: 5.5 10*3/uL (ref 1.4–7.7)
Neutrophils Relative %: 66 % (ref 43.0–77.0)
Platelets: 295 10*3/uL (ref 150.0–400.0)
RBC: 3.95 Mil/uL (ref 3.87–5.11)
RDW: 13.4 % (ref 11.5–15.5)
WBC: 8.2 10*3/uL (ref 4.0–10.5)

## 2022-12-28 LAB — TSH: TSH: 1.89 u[IU]/mL (ref 0.35–5.50)

## 2022-12-28 LAB — HEMOGLOBIN A1C: Hgb A1c MFr Bld: 7.3 % — ABNORMAL HIGH (ref 4.6–6.5)

## 2022-12-28 MED ORDER — FUROSEMIDE 20 MG PO TABS: 20.0000 mg | ORAL_TABLET | Freq: Every day | ORAL | 0 refills | Status: DC | PRN

## 2022-12-28 NOTE — Progress Notes (Signed)
Established Patient Office Visit  Subjective   Patient ID: Madison Hernandez, female    DOB: 1940/04/07  Age: 82 y.o. MRN: [id number]  Chief Complaint  Patient presents with   Leg Pain    Patient complains of right leg pain, x4 weeks    Dizziness    HPI   Madison Hernandez is here for medical follow-up.  Madison Hernandez has history of hypertension, migraine headaches, IBS, hypothyroidism, type 2 diabetes, osteoarthritis, chronic kidney disease.  Madison Hernandez had been complaining of excessive fatigue about a week ago and decided to stop chlorthalidone on her own.  Madison Hernandez states Madison Hernandez is felt significantly better over the past week since stopping the chlorthalidone.  Not monitoring her blood pressures consistently.  Madison Hernandez does still remain on benazepril and amlodipine for hypertension.  Madison Hernandez has some intermittent bilateral leg edema and has taken low-dose furosemide in the past and requesting prescription to take as needed  No recent dyspnea.  No orthopnea.  Madison Hernandez is on low-dose levothyroxine 25 mcg daily.  Due for follow-up TSH.  Madison Hernandez has history of hyperglycemia.  Last A1c 6.4%.  Not monitoring sugars regularly.  Stopped taking metformin on her own.  Denied any side effects.  Madison Hernandez states Madison Hernandez has been off the metformin for several months.  Occasional burning sensation right leg.  Presumed neuropathy.  Has seen pain specialist and takes gabapentin as needed.  Denies any lower extremity weakness  Madison Hernandez states Madison Hernandez has already received seasonal flu vaccine although we have no record.  Past Medical History:  Diagnosis Date   Acute ischemic colitis (HCC) 1/12   Arthritis    Bleeding ulcer    22 years ago   Blood in stool    Cardiac arrhythmia due to congenital heart disease    Depression    Diverticulitis    Dyslipidemia    Headache(784.0)    Hyperlipidemia    Hypertension    Migraine    UTI (urinary tract infection)    Past Surgical History:  Procedure Laterality Date   bleeding ulcers     22 years ago   vein procedures      right and left lower extremity vein procedures    reports that Madison Hernandez quit smoking about 39 years ago. Her smoking use included cigarettes. Madison Hernandez started smoking about 49 years ago. Madison Hernandez has a 20 pack-year smoking history. Madison Hernandez has never used smokeless tobacco. Madison Hernandez reports that Madison Hernandez does not drink alcohol and does not use drugs. family history includes Diabetes in her mother; Heart disease in her mother; Hypertension in her mother and sister. Allergies  Allergen Reactions   Avelox [Moxifloxacin Hcl In Nacl] Shortness Of Breath   Mucinex [Guaifenesin Er] Other (See Comments)    "Makes my heart flutter"   Other     Other reaction(s): Unknown   Sulfa Antibiotics Hives   Albuterol Swelling and Palpitations   Cefdinir Rash    Review of Systems  Constitutional:  Negative for chills and fever.  Eyes:  Negative for blurred vision.  Respiratory:  Negative for shortness of breath.   Cardiovascular:  Positive for leg swelling. Negative for chest pain.  Gastrointestinal:  Negative for abdominal pain.  Genitourinary:  Negative for dysuria.  Neurological:  Negative for dizziness, weakness and headaches.      Objective:     BP 130/60 (BP Location: Left Arm, Patient Position: Sitting, Cuff Size: Normal)   Pulse 90   Temp 97.8 F (36.6 C) (Oral)   Ht 5\' 4"  (1.626 m)  Wt 154 lb 11.2 oz (70.2 kg)   SpO2 98%   BMI 26.55 kg/m  BP Readings from Last 3 Encounters:  12/28/22 130/60  07/07/22 (!) 170/82  05/26/22 (!) 144/72   Wt Readings from Last 3 Encounters:  12/28/22 154 lb 11.2 oz (70.2 kg)  07/07/22 157 lb (71.2 kg)  05/26/22 156 lb 3.2 oz (70.9 kg)      Physical Exam Vitals reviewed.  Cardiovascular:     Rate and Rhythm: Normal rate and regular rhythm.  Pulmonary:     Effort: Pulmonary effort is normal.     Breath sounds: Normal breath sounds. No wheezing or rales.  Musculoskeletal:     Comments: Trace edema lower legs bilaterally  Neurological:     Mental Status: Madison Hernandez is alert.       Results for orders placed or performed in visit on 12/28/22  Microalbumin / creatinine urine ratio  Result Value Ref Range   Microalb Creat Ratio 29.99       The ASCVD Risk score (Arnett DK, et al., 2019) failed to calculate for the following reasons:   The 2019 ASCVD risk score is only valid for ages 63 to 44    Assessment & Plan:   #1 type 2 diabetes.  Last A1c 6.4%.  Patient currently not taking metformin.  Not monitoring blood sugars regularly.  Recheck A1c.  Madison Hernandez had recent urine microalbumin screen which was negative.  #2 hypertension.  Patient currently treated with amlodipine and benazepril.  Madison Hernandez stopped taking chlorthalidone on her own but blood pressure stable today.  Continue low-sodium diet and close monitoring  #3 hypothyroidism.  On low-dose levothyroxine.  Recheck TSH today  #4 recent fatigue issues improved after stopping chlorthalidone.  Recheck TSH and CBC.   Return in about 3 months (around 03/28/2023).    Evelena Peat, MD

## 2022-12-29 DIAGNOSIS — M7989 Other specified soft tissue disorders: Secondary | ICD-10-CM | POA: Diagnosis not present

## 2022-12-29 DIAGNOSIS — I83892 Varicose veins of left lower extremities with other complications: Secondary | ICD-10-CM | POA: Diagnosis not present

## 2022-12-29 DIAGNOSIS — I83812 Varicose veins of left lower extremities with pain: Secondary | ICD-10-CM | POA: Diagnosis not present

## 2022-12-30 NOTE — Addendum Note (Signed)
Addended by: Johnella Moloney on: 12/30/2022 04:43 PM   Modules accepted: Orders

## 2023-01-07 DIAGNOSIS — I83891 Varicose veins of right lower extremities with other complications: Secondary | ICD-10-CM | POA: Diagnosis not present

## 2023-01-07 DIAGNOSIS — I83012 Varicose veins of right lower extremity with ulcer of calf: Secondary | ICD-10-CM | POA: Diagnosis not present

## 2023-01-07 DIAGNOSIS — I8311 Varicose veins of right lower extremity with inflammation: Secondary | ICD-10-CM | POA: Diagnosis not present

## 2023-01-07 DIAGNOSIS — L97219 Non-pressure chronic ulcer of right calf with unspecified severity: Secondary | ICD-10-CM | POA: Diagnosis not present

## 2023-01-13 ENCOUNTER — Other Ambulatory Visit: Payer: Medicare Other

## 2023-01-13 DIAGNOSIS — N289 Disorder of kidney and ureter, unspecified: Secondary | ICD-10-CM

## 2023-01-13 LAB — BASIC METABOLIC PANEL
BUN: 21 mg/dL (ref 6–23)
CO2: 25 meq/L (ref 19–32)
Calcium: 9.6 mg/dL (ref 8.4–10.5)
Chloride: 104 meq/L (ref 96–112)
Creatinine, Ser: 1.32 mg/dL — ABNORMAL HIGH (ref 0.40–1.20)
GFR: 38.04 mL/min — ABNORMAL LOW (ref >60.00)
Glucose, Bld: 103 mg/dL — ABNORMAL HIGH (ref 70–99)
Potassium: 4 meq/L (ref 3.5–5.1)
Sodium: 136 meq/L (ref 135–145)

## 2023-01-18 ENCOUNTER — Encounter: Payer: Medicare Other | Admitting: Family Medicine

## 2023-01-19 ENCOUNTER — Other Ambulatory Visit: Payer: Self-pay | Admitting: Family Medicine

## 2023-01-21 DIAGNOSIS — I83892 Varicose veins of left lower extremities with other complications: Secondary | ICD-10-CM | POA: Diagnosis not present

## 2023-01-21 DIAGNOSIS — M7989 Other specified soft tissue disorders: Secondary | ICD-10-CM | POA: Diagnosis not present

## 2023-01-21 DIAGNOSIS — I83812 Varicose veins of left lower extremities with pain: Secondary | ICD-10-CM | POA: Diagnosis not present

## 2023-01-31 DIAGNOSIS — L905 Scar conditions and fibrosis of skin: Secondary | ICD-10-CM | POA: Diagnosis not present

## 2023-02-01 ENCOUNTER — Telehealth: Payer: Self-pay | Admitting: Family Medicine

## 2023-02-01 NOTE — Telephone Encounter (Signed)
 Patient is calling in about her cough medicine   she would like to know If  her cough  meds where called in

## 2023-02-02 DIAGNOSIS — M170 Bilateral primary osteoarthritis of knee: Secondary | ICD-10-CM | POA: Diagnosis not present

## 2023-02-02 MED ORDER — BENZONATATE 100 MG PO CAPS
ORAL_CAPSULE | ORAL | 0 refills | Status: DC
Start: 2023-02-02 — End: 2023-03-01

## 2023-02-02 NOTE — Telephone Encounter (Signed)
Patient informed of the message below and voiced understanding. Rx sent

## 2023-02-02 NOTE — Telephone Encounter (Signed)
 I spoke with the patient and she reported Non productive cough, x2 days. Patient stated she has been taking Robitussin but inquired if something else can be called in for her cough.

## 2023-02-05 DIAGNOSIS — J209 Acute bronchitis, unspecified: Secondary | ICD-10-CM | POA: Diagnosis not present

## 2023-02-05 DIAGNOSIS — R051 Acute cough: Secondary | ICD-10-CM | POA: Diagnosis not present

## 2023-02-05 DIAGNOSIS — R0989 Other specified symptoms and signs involving the circulatory and respiratory systems: Secondary | ICD-10-CM | POA: Diagnosis not present

## 2023-02-11 ENCOUNTER — Other Ambulatory Visit: Payer: Self-pay | Admitting: Family Medicine

## 2023-02-11 DIAGNOSIS — J209 Acute bronchitis, unspecified: Secondary | ICD-10-CM | POA: Diagnosis not present

## 2023-02-11 DIAGNOSIS — R051 Acute cough: Secondary | ICD-10-CM | POA: Diagnosis not present

## 2023-02-11 DIAGNOSIS — B370 Candidal stomatitis: Secondary | ICD-10-CM | POA: Diagnosis not present

## 2023-02-11 DIAGNOSIS — I10 Essential (primary) hypertension: Secondary | ICD-10-CM | POA: Diagnosis not present

## 2023-02-15 DIAGNOSIS — I83891 Varicose veins of right lower extremities with other complications: Secondary | ICD-10-CM | POA: Diagnosis not present

## 2023-02-17 ENCOUNTER — Other Ambulatory Visit: Payer: Self-pay | Admitting: Family

## 2023-02-17 ENCOUNTER — Other Ambulatory Visit: Payer: Self-pay | Admitting: Family Medicine

## 2023-02-18 ENCOUNTER — Ambulatory Visit: Payer: Medicare Other | Admitting: Family Medicine

## 2023-02-19 ENCOUNTER — Other Ambulatory Visit: Payer: Self-pay | Admitting: Family Medicine

## 2023-02-19 DIAGNOSIS — K5900 Constipation, unspecified: Secondary | ICD-10-CM

## 2023-02-28 DIAGNOSIS — L905 Scar conditions and fibrosis of skin: Secondary | ICD-10-CM | POA: Diagnosis not present

## 2023-03-01 ENCOUNTER — Encounter: Payer: Self-pay | Admitting: Family Medicine

## 2023-03-01 ENCOUNTER — Ambulatory Visit (INDEPENDENT_AMBULATORY_CARE_PROVIDER_SITE_OTHER): Payer: Medicare Other | Admitting: Family Medicine

## 2023-03-01 VITALS — BP 130/70 | HR 79 | Temp 97.9°F | Ht 64.0 in | Wt 146.6 lb

## 2023-03-01 DIAGNOSIS — I10 Essential (primary) hypertension: Secondary | ICD-10-CM | POA: Diagnosis not present

## 2023-03-01 DIAGNOSIS — R634 Abnormal weight loss: Secondary | ICD-10-CM

## 2023-03-01 DIAGNOSIS — E1165 Type 2 diabetes mellitus with hyperglycemia: Secondary | ICD-10-CM | POA: Diagnosis not present

## 2023-03-01 DIAGNOSIS — N183 Chronic kidney disease, stage 3 unspecified: Secondary | ICD-10-CM | POA: Diagnosis not present

## 2023-03-01 MED ORDER — AMLODIPINE BESYLATE 10 MG PO TABS
10.0000 mg | ORAL_TABLET | Freq: Every day | ORAL | 3 refills | Status: DC
Start: 2023-03-01 — End: 2023-04-18

## 2023-03-01 NOTE — Progress Notes (Signed)
Established Patient Office Visit  Subjective   Patient ID: Madison Hernandez, female    DOB: 10/29/1940  Age: 83 y.o. MRN: [id number]  Chief Complaint  Patient presents with   Bronchitis    Follow-up feeling weak, cold chills, not eating, 2 weeks     HPI   Madison Hernandez has history of hypertension, migraine headaches, PVCs, IBS, hypothyroidism, type 2 diabetes, osteoarthritis, chronic kidney disease.  She is seen today with reported respiratory illness couple weeks ago.  She states she had "bronchitis ".  Never had any fever.  Had initially bad cough but resolved at this point.  Still has some lingering fatigue.  Denies any nausea, vomiting, or diarrhea.  She does relate that her appetite has been down since her illness.  Her weight was 154 pounds December 10 to currently 146 pounds.  Still says some nasal congestion but no sinus pain.  She denies any abdominal pain, change in stool habits, headaches.  Medications reviewed.  She remains on rosuvastatin 40 mg daily, levothyroxine 25 mcg daily, Linzess 145 mcg daily, Lotensin 20 mg daily, amlodipine 10 mg daily.  She had worsening of kidney function by labs in December we had her hold diuretic and follow-up labs were somewhat improved.  She denies any recent swelling or edema.  Recent TSH at goal.  Past Medical History:  Diagnosis Date   Acute ischemic colitis (HCC) 1/12   Arthritis    Bleeding ulcer    22 years ago   Blood in stool    Cardiac arrhythmia due to congenital heart disease    Depression    Diverticulitis    Dyslipidemia    Headache(784.0)    Hyperlipidemia    Hypertension    Migraine    UTI (urinary tract infection)    Past Surgical History:  Procedure Laterality Date   bleeding ulcers     22 years ago   vein procedures     right and left lower extremity vein procedures    reports that she quit smoking about 40 years ago. Her smoking use included cigarettes. She started smoking about 50 years ago. She has a 20  pack-year smoking history. She has never used smokeless tobacco. She reports that she does not drink alcohol and does not use drugs. family history includes Diabetes in her mother; Heart disease in her mother; Hypertension in her mother and sister. Allergies  Allergen Reactions   Avelox [Moxifloxacin Hcl In Nacl] Shortness Of Breath   Mucinex [Guaifenesin Er] Other (See Comments)    "Makes my heart flutter"   Other     Other reaction(s): Unknown   Sulfa Antibiotics Hives   Albuterol Swelling and Palpitations   Cefdinir Rash    Review of Systems  Constitutional:  Positive for malaise/fatigue and weight loss. Negative for chills and fever.  Eyes:  Negative for blurred vision.  Respiratory:  Negative for shortness of breath.   Cardiovascular:  Negative for chest pain and leg swelling.  Gastrointestinal:  Negative for abdominal pain, blood in stool, diarrhea, nausea and vomiting.  Genitourinary:  Negative for dysuria.  Neurological:  Negative for dizziness, weakness and headaches.      Objective:     BP 130/70 (BP Location: Left Arm, Patient Position: Sitting, Cuff Size: Normal)   Pulse 79   Temp 97.9 F (36.6 C) (Oral)   Ht 5\' 4"  (1.626 m)   Wt 146 lb 9.6 oz (66.5 kg)   SpO2 99%   BMI 25.16 kg/m  BP  Readings from Last 3 Encounters:  03/01/23 130/70  12/28/22 130/60  07/07/22 (!) 170/82   Wt Readings from Last 3 Encounters:  03/01/23 146 lb 9.6 oz (66.5 kg)  12/28/22 154 lb 11.2 oz (70.2 kg)  07/07/22 157 lb (71.2 kg)      Physical Exam Vitals reviewed.  Constitutional:      General: She is not in acute distress.    Appearance: She is well-developed. She is not ill-appearing.  Eyes:     Pupils: Pupils are equal, round, and reactive to light.  Neck:     Thyroid: No thyromegaly.     Vascular: No JVD.  Cardiovascular:     Rate and Rhythm: Normal rate and regular rhythm.     Heart sounds:     No gallop.  Pulmonary:     Effort: Pulmonary effort is normal. No  respiratory distress.     Breath sounds: Normal breath sounds. No wheezing or rales.  Musculoskeletal:     Cervical back: Neck supple.     Right lower leg: No edema.     Left lower leg: No edema.  Neurological:     Mental Status: She is alert.      No results found for any visits on 03/01/23.    The ASCVD Risk score (Arnett DK, et al., 2019) failed to calculate for the following reasons:   The 2019 ASCVD risk score is only valid for ages 61 to 30    Assessment & Plan:   #1 recent cough.  Suspect acute viral bronchitis.  Lungs clear at this point and cough resolving.  Observe for now  #2 hypertension stable and at goal.  Continue current medications with Lotensin and amlodipine.  #3 unintentional weight loss.  Has lost about 8 pounds past couple months.  She relates this to recent respiratory illness.  We suggested she consider supplements such as Glucerna.  Set up 1 month follow-up and if still losing at that point consider further evaluation  #4 type 2 diabetes.  Last A1c 7.3% which is up for her.  Recheck at 1 month follow-up.  Consider medication if still climbing at that point  #5 chronic kidney disease.  Reminded her to avoid all nonsteroidals and stay well-hydrated.  Recheck renal function in 1 month follow-up  Evelena Peat, MD

## 2023-03-01 NOTE — Patient Instructions (Signed)
Consider humidifier for use in bedroom at night  Consider Glucerna nutrition supplement  Set up one month follow up.

## 2023-03-02 DIAGNOSIS — I83891 Varicose veins of right lower extremities with other complications: Secondary | ICD-10-CM | POA: Diagnosis not present

## 2023-03-15 ENCOUNTER — Ambulatory Visit: Payer: Medicare Other | Admitting: Family Medicine

## 2023-03-16 DIAGNOSIS — I83891 Varicose veins of right lower extremities with other complications: Secondary | ICD-10-CM | POA: Diagnosis not present

## 2023-03-16 DIAGNOSIS — M7989 Other specified soft tissue disorders: Secondary | ICD-10-CM | POA: Diagnosis not present

## 2023-03-16 DIAGNOSIS — I83811 Varicose veins of right lower extremities with pain: Secondary | ICD-10-CM | POA: Diagnosis not present

## 2023-03-29 ENCOUNTER — Ambulatory Visit (INDEPENDENT_AMBULATORY_CARE_PROVIDER_SITE_OTHER): Payer: Medicare Other | Admitting: Family Medicine

## 2023-03-29 ENCOUNTER — Encounter: Payer: Self-pay | Admitting: Family Medicine

## 2023-03-29 VITALS — BP 132/78 | HR 88 | Temp 98.1°F | Ht 64.0 in | Wt 154.6 lb

## 2023-03-29 DIAGNOSIS — N189 Chronic kidney disease, unspecified: Secondary | ICD-10-CM | POA: Diagnosis not present

## 2023-03-29 DIAGNOSIS — I10 Essential (primary) hypertension: Secondary | ICD-10-CM | POA: Diagnosis not present

## 2023-03-29 DIAGNOSIS — E1165 Type 2 diabetes mellitus with hyperglycemia: Secondary | ICD-10-CM | POA: Diagnosis not present

## 2023-03-29 DIAGNOSIS — N183 Chronic kidney disease, stage 3 unspecified: Secondary | ICD-10-CM

## 2023-03-29 DIAGNOSIS — E039 Hypothyroidism, unspecified: Secondary | ICD-10-CM

## 2023-03-29 DIAGNOSIS — L719 Rosacea, unspecified: Secondary | ICD-10-CM

## 2023-03-29 LAB — POCT GLYCOSYLATED HEMOGLOBIN (HGB A1C): Hemoglobin A1C: 6.4 % — AB (ref 4.0–5.6)

## 2023-03-29 MED ORDER — METRONIDAZOLE 0.75 % EX CREA: TOPICAL_CREAM | Freq: Two times a day (BID) | CUTANEOUS | 1 refills | Status: DC

## 2023-03-29 NOTE — Progress Notes (Signed)
 Established Patient Office Visit  Subjective   Patient ID: Madison Hernandez, female    DOB: December 29, 1940  Age: 83 y.o. MRN: [id number]  Chief Complaint  Patient presents with   Medical Management of Chronic Issues    Patient came in today for 1 month follow-up, DM weight loss and cough,  patient states she is feeling better, but having a lot of pain all over, patient would also like a cream for her rosacea     HPI   Madison Hernandez is seen for medical follow-up.  Refer to last note for details.  She has had some weight loss which was unintentional.  She does relate having flulike illness and she thinks that her weight loss was related to respiratory illness.  She started taking some Glucerna and her weight is up about 8 pounds today which is near her baseline.  She feels much better overall.  Does not have any lingering symptoms.  History of type 2 diabetes currently not treated with medications.  Her last A1c 3 months ago was 7.3%.  We recommended follow-up today and consideration for medication if still climbing.  She does not monitor blood sugars regularly  She has hypertension treated with amlodipine 10 mg daily and Lotensin 20 mg daily.  Has not required any recent furosemide.  No dizziness.  No lightheadedness.  Compliant with medications.  She takes her blood pressure medications early in the morning  History of rosacea which is relatively mild.  She has used MetroGel cream in the past and requesting refill.  She does not use this consistently.  Past Medical History:  Diagnosis Date   Acute ischemic colitis (HCC) 1/12   Arthritis    Bleeding ulcer    22 years ago   Blood in stool    Cardiac arrhythmia due to congenital heart disease    Depression    Diverticulitis    Dyslipidemia    Headache(784.0)    Hyperlipidemia    Hypertension    Migraine    UTI (urinary tract infection)    Past Surgical History:  Procedure Laterality Date   bleeding ulcers     22 years ago   vein  procedures     right and left lower extremity vein procedures    reports that she quit smoking about 40 years ago. Her smoking use included cigarettes. She started smoking about 50 years ago. She has a 20 pack-year smoking history. She has never used smokeless tobacco. She reports that she does not drink alcohol and does not use drugs. family history includes Diabetes in her mother; Heart disease in her mother; Hypertension in her mother and sister. Allergies  Allergen Reactions   Avelox [Moxifloxacin Hcl In Nacl] Shortness Of Breath   Mucinex [Guaifenesin Er] Other (See Comments)    "Makes my heart flutter"   Other     Other reaction(s): Unknown   Sulfa Antibiotics Hives   Albuterol Swelling and Palpitations   Cefdinir Rash    Review of Systems  Constitutional:  Negative for chills, fever and malaise/fatigue.  Eyes:  Negative for blurred vision.  Respiratory:  Negative for shortness of breath.   Cardiovascular:  Negative for chest pain.  Gastrointestinal:  Negative for abdominal pain.  Neurological:  Negative for dizziness, weakness and headaches.      Objective:     BP 132/78 (BP Location: Left Arm, Patient Position: Sitting, Cuff Size: Normal)   Pulse 88   Temp 98.1 F (36.7 C) (Oral)   Ht  5\' 4"  (1.626 m)   Wt 154 lb 9.6 oz (70.1 kg)   SpO2 97%   BMI 26.54 kg/m  BP Readings from Last 3 Encounters:  03/29/23 132/78  03/01/23 130/70  12/28/22 130/60   Wt Readings from Last 3 Encounters:  03/29/23 154 lb 9.6 oz (70.1 kg)  03/01/23 146 lb 9.6 oz (66.5 kg)  12/28/22 154 lb 11.2 oz (70.2 kg)      Physical Exam Vitals reviewed.  Constitutional:      General: She is not in acute distress.    Appearance: She is well-developed. She is not ill-appearing.  Eyes:     Pupils: Pupils are equal, round, and reactive to light.  Neck:     Thyroid: No thyromegaly.     Vascular: No JVD.  Cardiovascular:     Rate and Rhythm: Normal rate and regular rhythm.     Heart  sounds:     No gallop.  Pulmonary:     Effort: Pulmonary effort is normal. No respiratory distress.     Breath sounds: Normal breath sounds. No wheezing or rales.  Musculoskeletal:     Cervical back: Neck supple.     Right lower leg: No edema.     Left lower leg: No edema.  Neurological:     Mental Status: She is alert.      No results found for any visits on 03/29/23.  Last CBC Lab Results  Component Value Date   WBC 8.2 12/28/2022   HGB 11.8 (L) 12/28/2022   HCT 36.1 12/28/2022   MCV 91.4 12/28/2022   MCH 28.8 08/05/2016   RDW 13.4 12/28/2022   PLT 295.0 12/28/2022   Last metabolic panel Lab Results  Component Value Date   GLUCOSE 103 (H) 01/13/2023   NA 136 01/13/2023   K 4.0 01/13/2023   CL 104 01/13/2023   CO2 25 01/13/2023   BUN 21 01/13/2023   CREATININE 1.32 (H) 01/13/2023   GFR 38.04 (L) 01/13/2023   CALCIUM 9.6 01/13/2023   PROT 8.0 04/06/2022   ALBUMIN 4.7 04/06/2022   BILITOT 0.4 04/06/2022   ALKPHOS 63 04/06/2022   AST 20 04/06/2022   ALT 15 04/06/2022   ANIONGAP 10 08/05/2016   Last lipids Lab Results  Component Value Date   CHOL 203 (H) 07/07/2022   HDL 72.40 07/07/2022   LDLCALC 100 (H) 07/07/2022   LDLDIRECT 321.0 09/17/2020   TRIG 156.0 (H) 07/07/2022   CHOLHDL 3 07/07/2022   Last hemoglobin A1c Lab Results  Component Value Date   HGBA1C 6.4 (A) 03/29/2023   Last thyroid functions Lab Results  Component Value Date   TSH 1.89 12/28/2022      The ASCVD Risk score (Arnett DK, et al., 2019) failed to calculate for the following reasons:   The 2019 ASCVD risk score is only valid for ages 47 to 65    Assessment & Plan:   #1 recent unintentional weight loss probably related to to recent viral illness.  Weight is back up today near her baseline.  Reassurance and observe for now  #2 type 2 diabetes.  Recent A1c 7.3%.  Repeat today down to 6.4%.  Continue lower glycemic diet and reassess in 6 months  #3 hypertension stable and  controlled on amlodipine and benazepril.  Continue current regimen.  #4 chronic kidney disease.  Patient still using meloxicam and Aleve periodically for arthritis pains.  We have strongly encouraged her to avoid all nonsteroidals and use Tylenol instead.  She states  Tylenol does not usually work well.  #5 history of rosacea.  Patient requesting refills of metronidazole cream.  Refill provided   Return in about 6 months (around 09/29/2023).    Evelena Peat, MD

## 2023-03-29 NOTE — Patient Instructions (Addendum)
 Try to avoid the Meloxicam and Aleve as much as possible (secondary to chronic kidney disease)..  Set up 6 month follow up.

## 2023-03-31 DIAGNOSIS — Z5181 Encounter for therapeutic drug level monitoring: Secondary | ICD-10-CM | POA: Diagnosis not present

## 2023-03-31 DIAGNOSIS — Z79899 Other long term (current) drug therapy: Secondary | ICD-10-CM | POA: Diagnosis not present

## 2023-03-31 DIAGNOSIS — M170 Bilateral primary osteoarthritis of knee: Secondary | ICD-10-CM | POA: Diagnosis not present

## 2023-03-31 DIAGNOSIS — M542 Cervicalgia: Secondary | ICD-10-CM | POA: Diagnosis not present

## 2023-03-31 DIAGNOSIS — M546 Pain in thoracic spine: Secondary | ICD-10-CM | POA: Diagnosis not present

## 2023-04-01 ENCOUNTER — Other Ambulatory Visit: Payer: Self-pay | Admitting: Family Medicine

## 2023-04-01 DIAGNOSIS — I83813 Varicose veins of bilateral lower extremities with pain: Secondary | ICD-10-CM | POA: Diagnosis not present

## 2023-04-01 DIAGNOSIS — I8311 Varicose veins of right lower extremity with inflammation: Secondary | ICD-10-CM | POA: Diagnosis not present

## 2023-04-01 DIAGNOSIS — I83012 Varicose veins of right lower extremity with ulcer of calf: Secondary | ICD-10-CM | POA: Diagnosis not present

## 2023-04-01 DIAGNOSIS — M7989 Other specified soft tissue disorders: Secondary | ICD-10-CM | POA: Diagnosis not present

## 2023-04-01 DIAGNOSIS — L97219 Non-pressure chronic ulcer of right calf with unspecified severity: Secondary | ICD-10-CM | POA: Diagnosis not present

## 2023-04-01 DIAGNOSIS — R600 Localized edema: Secondary | ICD-10-CM | POA: Diagnosis not present

## 2023-04-01 DIAGNOSIS — I10 Essential (primary) hypertension: Secondary | ICD-10-CM

## 2023-04-05 ENCOUNTER — Ambulatory Visit: Payer: Self-pay | Admitting: Family Medicine

## 2023-04-05 NOTE — Telephone Encounter (Signed)
Patient has appt 3/19

## 2023-04-05 NOTE — Telephone Encounter (Signed)
 Copied from CRM [id number]. Topic: Clinical - Red Word Triage >> Apr 05, 2023  1:58 PM Elizebeth Brooking wrote: [facility] that prompted transfer to Nurse Triage: Patient called in stating she has started wheezing that she can hear herself, coughing up alil bit of flem    Chief Complaint: Cough, wheezing Symptoms: Above Frequency: 3 weeks Pertinent Negatives: Patient denies fever Disposition: [] ED /[] Urgent Care (no appt availability in office) / [x] Appointment(In office/virtual)/ []  [place] [facility]/ [] Home Care/ [] Refused Recommended Disposition /[] [place] [facility]/ []  Follow-up with PCP Additional Notes: Agrees with appointment.  Reason for Disposition  Cough has been present for > 3 weeks  Answer Assessment - Initial Assessment Questions 1. ONSET: "When did the cough begin?"      3 weeks 2. SEVERITY: "How bad is the cough today?"      Mild 3. SPUTUM: "Describe the color of your sputum" (none, dry cough; clear, white, yellow, green)     Green 4. HEMOPTYSIS: "Are you coughing up any blood?" If so ask: "How much?" (flecks, streaks, tablespoons, etc.)     No 5. DIFFICULTY BREATHING: "Are you having difficulty breathing?" If Yes, ask: "How bad is it?" (e.g., mild, moderate, severe)    - MILD: No SOB at rest, mild SOB with walking, speaks normally in sentences, can lie down, no retractions, pulse < 100.    - MODERATE: SOB at rest, SOB with minimal exertion and prefers to sit, cannot lie down flat, speaks in phrases, mild retractions, audible wheezing, pulse 100-120.    - SEVERE: Very SOB at rest, speaks in single words, struggling to breathe, sitting hunched forward, retractions, pulse > 120      No 6. FEVER: "Do you have a fever?" If Yes, ask: "What is your temperature, how was it measured, and when did it start?"     No 7. CARDIAC HISTORY: "Do you have any history of heart disease?" (e.g., heart attack, congestive heart failure)      No 8. LUNG HISTORY: "Do you have any history of  lung disease?"  (e.g., pulmonary embolus, asthma, emphysema)     No 9. PE RISK FACTORS: "Do you have a history of blood clots?" (or: recent major surgery, recent prolonged travel, bedridden)     No 10. OTHER SYMPTOMS: "Do you have any other symptoms?" (e.g., runny nose, wheezing, chest pain)       Wheezing 11. PREGNANCY: "Is there any chance you are pregnant?" "When was your last menstrual period?"       No 12. TRAVEL: "Have you traveled out of the country in the last month?" (e.g., travel history, exposures)       No  Protocols used: Cough - Acute Productive-A-AH

## 2023-04-06 ENCOUNTER — Encounter: Payer: Self-pay | Admitting: Family Medicine

## 2023-04-06 ENCOUNTER — Ambulatory Visit (INDEPENDENT_AMBULATORY_CARE_PROVIDER_SITE_OTHER): Admitting: Family Medicine

## 2023-04-06 VITALS — BP 138/60 | HR 74 | Temp 97.7°F | Wt 160.0 lb

## 2023-04-06 DIAGNOSIS — R062 Wheezing: Secondary | ICD-10-CM

## 2023-04-06 NOTE — Progress Notes (Signed)
 Established Patient Office Visit  Subjective   Patient ID: Madison Hernandez, female    DOB: 04/10/1940  Age: 83 y.o. MRN: [id number]  Chief Complaint  Patient presents with   Cough    Patient complains of productive cough, x1 day   Wheezing    Patient complains of wheezing, x1 day    HPI    Madison Hernandez is seen with some transient wheezing yesterday.  She was watching TV and noted somewhat of a wheezing type quality with expiration.  She states she may have had some mild intermittent asthma in childhood but no active asthma problems.  Non-smoker.  She actually denies any coughing now but apparently had little bit of mild coughing a few days ago.  Occasionally brings up colored sputum.  No fevers or chills.  Has no symptoms whatsoever today.  Denies any recent active GERD symptoms.  She does take benazepril for hypertension but no chronic cough.  Past Medical History:  Diagnosis Date   Acute ischemic colitis (HCC) 1/12   Arthritis    Bleeding ulcer    22 years ago   Blood in stool    Cardiac arrhythmia due to congenital heart disease    Depression    Diverticulitis    Dyslipidemia    Headache(784.0)    Hyperlipidemia    Hypertension    Migraine    UTI (urinary tract infection)    Past Surgical History:  Procedure Laterality Date   bleeding ulcers     22 years ago   vein procedures     right and left lower extremity vein procedures    reports that she quit smoking about 40 years ago. Her smoking use included cigarettes. She started smoking about 50 years ago. She has a 20 pack-year smoking history. She has never used smokeless tobacco. She reports that she does not drink alcohol and does not use drugs. family history includes Diabetes in her mother; Heart disease in her mother; Hypertension in her mother and sister. Allergies  Allergen Reactions   Avelox [Moxifloxacin Hcl In Nacl] Shortness Of Breath   Mucinex [Guaifenesin Er] Other (See Comments)    "Makes my heart flutter"    Other     Other reaction(s): Unknown   Sulfa Antibiotics Hives   Albuterol Swelling and Palpitations   Cefdinir Rash    Review of Systems  Constitutional:  Negative for chills, fever and weight loss.  HENT:  Negative for congestion, sinus pain and sore throat.   Respiratory:  Positive for sputum production. Negative for hemoptysis and shortness of breath.   Cardiovascular:  Negative for chest pain and leg swelling.      Objective:     BP 138/60 (BP Location: Left Arm, Patient Position: Sitting, Cuff Size: Normal)   Pulse 74   Temp 97.7 F (36.5 C) (Oral)   Wt 160 lb (72.6 kg)   SpO2 96%   BMI 27.46 kg/m  BP Readings from Last 3 Encounters:  04/06/23 138/60  03/29/23 132/78  03/01/23 130/70   Wt Readings from Last 3 Encounters:  04/06/23 160 lb (72.6 kg)  03/29/23 154 lb 9.6 oz (70.1 kg)  03/01/23 146 lb 9.6 oz (66.5 kg)      Physical Exam Vitals reviewed.  Constitutional:      General: She is not in acute distress.    Appearance: She is not ill-appearing.  HENT:     Mouth/Throat:     Mouth: Mucous membranes are moist.     Pharynx:  Oropharynx is clear.  Cardiovascular:     Rate and Rhythm: Normal rate and regular rhythm.  Pulmonary:     Effort: Pulmonary effort is normal.     Breath sounds: Normal breath sounds. No wheezing or rales.  Musculoskeletal:     Cervical back: Neck supple.  Lymphadenopathy:     Cervical: No cervical adenopathy.  Neurological:     Mental Status: She is alert.      No results found for any visits on 04/06/23.    The ASCVD Risk score (Arnett DK, et al., 2019) failed to calculate for the following reasons:   The 2019 ASCVD risk score is only valid for ages 60 to 19    Assessment & Plan:   Subjective transient wheezing yesterday in the setting of probable recent acute bronchitis.  She has no cough whatsoever today and no wheezing noted on exam.  Lung exam is unremarkable.  Recommend observation for now.  Stay  well-hydrated.  Follow-up immediately for any fever, shortness of breath, or other concerns  Evelena Peat, MD

## 2023-04-13 ENCOUNTER — Telehealth: Payer: Self-pay | Admitting: Family Medicine

## 2023-04-13 DIAGNOSIS — I83892 Varicose veins of left lower extremities with other complications: Secondary | ICD-10-CM | POA: Diagnosis not present

## 2023-04-13 DIAGNOSIS — M79605 Pain in left leg: Secondary | ICD-10-CM | POA: Diagnosis not present

## 2023-04-13 DIAGNOSIS — I872 Venous insufficiency (chronic) (peripheral): Secondary | ICD-10-CM | POA: Diagnosis not present

## 2023-04-13 DIAGNOSIS — I890 Lymphedema, not elsewhere classified: Secondary | ICD-10-CM | POA: Diagnosis not present

## 2023-04-13 NOTE — Telephone Encounter (Signed)
 Left a message for the patient to return my call.

## 2023-04-13 NOTE — Telephone Encounter (Unsigned)
 Copied from CRM [id number]. Topic: General - Other >> Apr 13, 2023  9:23 AM Gurney Maxin H wrote: Reason for CRM: Dr. Jimmie Molly is calling to speak with Dr. Caryl Never regarding patient, wouldn't provide any details said he'll go into details with provider, he would like for Dr. Caryl Never to call his cell phone he can leave a message on the best time to return call and leave a direct number where Dr. Caryl Never can be reached, thanks.  Dr. Jimmie Molly [phone]

## 2023-04-14 NOTE — Telephone Encounter (Signed)
 Patient has upcoming appt on 03/31

## 2023-04-18 ENCOUNTER — Ambulatory Visit (INDEPENDENT_AMBULATORY_CARE_PROVIDER_SITE_OTHER): Admitting: Family Medicine

## 2023-04-18 ENCOUNTER — Encounter: Payer: Self-pay | Admitting: Family Medicine

## 2023-04-18 VITALS — BP 134/64 | HR 75 | Temp 97.6°F | Wt 158.6 lb

## 2023-04-18 DIAGNOSIS — I10 Essential (primary) hypertension: Secondary | ICD-10-CM

## 2023-04-18 DIAGNOSIS — R600 Localized edema: Secondary | ICD-10-CM

## 2023-04-18 MED ORDER — AMLODIPINE BESYLATE 5 MG PO TABS: 5.0000 mg | ORAL_TABLET | Freq: Every day | ORAL | 3 refills | Status: DC

## 2023-04-18 MED ORDER — HYDROCHLOROTHIAZIDE 12.5 MG PO CAPS: 12.5000 mg | ORAL_CAPSULE | Freq: Every day | ORAL | 3 refills | Status: DC

## 2023-04-18 NOTE — Patient Instructions (Signed)
 STOP the Norvasc 10   START Norvasc (Amlodipine) 5 mg once daily  START hydrochlorothiazide 12.5 mg once daily  Elevate legs frequently.    Set up follow up in 3-4 weeks.

## 2023-04-18 NOTE — Progress Notes (Signed)
 Established Patient Office Visit  Subjective   Patient ID: Madison Hernandez, female    DOB: [birth year]  Age: 83 y.o. MRN: [id number]  Chief Complaint  Patient presents with   Medical Management of Chronic Issues    HPI   Madison Hernandez is seen for follow guarding some bilateral lower extremity edema.  She has longstanding history of intermittent edema.  I received a call from her vein specialist last week concerned about "lymphedema ".  They also apparently noted slightly enlarged inguinal node.  Patient has battled some edema issues in the past.  Her edema is relatively symmetric.  She does take amlodipine 10 mg daily and also gabapentin 300 mg 3 times daily which could both be contributing.  She does take furosemide 20 mg once daily as needed for severe edema but not on a regular basis.  Denies any recent dyspnea.  No orthopnea.  No recent dietary changes.  Also takes frequent nonsteroidals apparently for her back pain.  Back pain has been managed by back specialist.  Denies any fever, chills, night sweats, appetite change, or significant weight change.  It is noted though her weight is up a few pounds today compared with couple weeks ago  Past Medical History:  Diagnosis Date   Acute ischemic colitis (HCC) 1/12   Arthritis    Bleeding ulcer    22 years ago   Blood in stool    Cardiac arrhythmia due to congenital heart disease    Depression    Diverticulitis    Dyslipidemia    Headache(784.0)    Hyperlipidemia    Hypertension    Migraine    UTI (urinary tract infection)    Past Surgical History:  Procedure Laterality Date   bleeding ulcers     22 years ago   vein procedures     right and left lower extremity vein procedures    reports that she quit smoking about 40 years ago. Her smoking use included cigarettes. She started smoking about 50 years ago. She has a 20 pack-year smoking history. She has never used smokeless tobacco. She reports that she does not drink alcohol and does  not use drugs. family history includes Diabetes in her mother; Heart disease in her mother; Hypertension in her mother and sister. Allergies  Allergen Reactions   Avelox [Moxifloxacin Hcl In Nacl] Shortness Of Breath   Mucinex [Guaifenesin Er] Other (See Comments)    "Makes my heart flutter"   Other     Other reaction(s): Unknown   Sulfa Antibiotics Hives   Albuterol Swelling and Palpitations   Cefdinir Rash    Review of Systems  Constitutional:  Negative for chills, fever and malaise/fatigue.  Eyes:  Negative for blurred vision.  Respiratory:  Negative for shortness of breath.   Cardiovascular:  Positive for leg swelling. Negative for chest pain, orthopnea and claudication.  Neurological:  Negative for dizziness, weakness and headaches.      Objective:     BP 134/64 (BP Location: Left Arm, Patient Position: Sitting, Cuff Size: Normal)   Pulse 75   Temp 97.6 F (36.4 C) (Oral)   Wt 158 lb 9.6 oz (71.9 kg)   SpO2 96%   BMI 27.22 kg/m  BP Readings from Last 3 Encounters:  04/18/23 134/64  04/06/23 138/60  03/29/23 132/78   Wt Readings from Last 3 Encounters:  04/18/23 158 lb 9.6 oz (71.9 kg)  04/06/23 160 lb (72.6 kg)  03/29/23 154 lb 9.6 oz (70.1 kg)  Physical Exam Vitals reviewed.  Constitutional:      Appearance: She is well-developed.  Eyes:     Pupils: Pupils are equal, round, and reactive to light.  Neck:     Thyroid: No thyromegaly.     Vascular: No JVD.  Cardiovascular:     Rate and Rhythm: Normal rate and regular rhythm.     Heart sounds:     No gallop.  Pulmonary:     Effort: Pulmonary effort is normal. No respiratory distress.     Breath sounds: Normal breath sounds. No wheezing or rales.  Abdominal:     Comments: Inguinal region is examined bilaterally with nurse chaperone present and cannot appreciate any significant inguinal adenopathy at this time  Musculoskeletal:     Cervical back: Neck supple.     Right lower leg: Edema present.      Left lower leg: Edema present.     Comments: She does have some mild pitting edema feet and lower legs bilaterally.  This appears to be relatively symmetric.  No calf tenderness.  Neurological:     Mental Status: She is alert.      No results found for any visits on 04/18/23.  Last CBC Lab Results  Component Value Date   WBC 8.2 12/28/2022   HGB 11.8 (L) 12/28/2022   HCT 36.1 12/28/2022   MCV 91.4 12/28/2022   MCH 28.8 08/05/2016   RDW 13.4 12/28/2022   PLT 295.0 12/28/2022   Last metabolic panel Lab Results  Component Value Date   GLUCOSE 103 (H) 01/13/2023   NA 136 01/13/2023   K 4.0 01/13/2023   CL 104 01/13/2023   CO2 25 01/13/2023   BUN 21 01/13/2023   CREATININE 1.32 (H) 01/13/2023   GFR 38.04 (L) 01/13/2023   CALCIUM 9.6 01/13/2023   PROT 8.0 04/06/2022   ALBUMIN 4.7 04/06/2022   BILITOT 0.4 04/06/2022   ALKPHOS 63 04/06/2022   AST 20 04/06/2022   ALT 15 04/06/2022   ANIONGAP 10 08/05/2016   Last hemoglobin A1c Lab Results  Component Value Date   HGBA1C 6.4 (A) 03/29/2023   Last thyroid functions Lab Results  Component Value Date   TSH 1.89 12/28/2022      The ASCVD Risk score (Arnett DK, et al., 2019) failed to calculate for the following reasons:   The 2019 ASCVD risk score is only valid for ages 92 to 45    Assessment & Plan:   #1 bilateral leg edema.  Suspect multifactorial.  She is on amlodipine 10 mg and also takes gabapentin 300 mg 3 times daily both of which could contribute to edema.  She also apparently takes over-the-counter nonsteroidals frequently.  We suggested the following  -Try to avoid nonsteroidals as much as possible -Reduce amlodipine to 5 mg daily -Add HCTZ 12.5 mg once daily -Elevate legs frequently -Set up 1 month follow-up.  Check basic metabolic panel then  #2 hypertension stable and well-controlled.  Addressing medication issues as above.  Continue low-sodium diet  Evelena Peat, MD

## 2023-04-19 ENCOUNTER — Ambulatory Visit: Payer: Self-pay

## 2023-04-19 NOTE — Telephone Encounter (Signed)
 Copied from CRM [id number]. Topic: Clinical - Red Word Triage >> Apr 19, 2023 12:10 PM Fonda Kinder J wrote: [facility] that prompted transfer to Nurse Triage:Pt states her legs are swollen and in pain. She advised me she was taken off of furosemide (LASIX) 20 MG tablet   Chief Complaint: Bilateral lower leg swelling Symptoms: swelling, pain Frequency: 1-2 weeks ago Pertinent Negatives: Patient denies fever, chest pain, difficulty breathing Disposition: [] ED /[] Urgent Care (no appt availability in office) / [] Appointment(In office/virtual)/ []  [facility]/ [] Home Care/ [x] Refused Recommended Disposition /[] [place] [facility]/ []  Follow-up with PCP Additional Notes: Patient called and advised that she just came to the office yesterday and states that her medication was changed at the office yesterday.  Patient states she wanted to know if she should take her Lasix to keep the swelling from getting worse.  She states that both lower legs are swollen from the knees down and the left is worse than the right.  She just wants a message to be sent to her doctor to let him know today her legs are about the same and she wants to know if she should take her Lasix to also help her leg swelling go down.  Patient is advised that the recommendation with bilateral lower leg swelling and one being worse than the other is to be seen by a provider in the next 4 hours.  Patient states that she just saw him yesterday and just wanted to know if she should start back taking her Lasix since the swelling is the same today or should she give it more time for the swelling to go down.  She states the vein doctor did scans on her legs and said she did not have any blood clots. Patient denies any chest pain, difficulty breathing, or fevers.  Patient is again advised that if she has any other questions to call us and if anything gets worse to go to the Emergency Room and Urgent Cares are also an option.  Patient states that she  is not going to urgent care and waiting all day.  She did state that if her PCP wants to see her again she would be okay with that but she was just looking for advice since she was just there yesterday.   Reason for Disposition  [1] Thigh, calf, or ankle swelling AND [2] bilateral AND [3] 1 side is more swollen  Answer Assessment - Initial Assessment Questions 1. ONSET: "When did the swelling start?" (e.g., minutes, hours, days)     A week or two ago 2. LOCATION: "What part of the leg is swollen?"  "Are both legs swollen or just one leg?"     Both lower legs---left lower leg worse than the right 3. SEVERITY: "How bad is the swelling?" (e.g., localized; mild, moderate, severe)   - Localized: Small area of swelling localized to one leg.   - MILD pedal edema: Swelling limited to foot and ankle, pitting edema < 1/4 inch (6 mm) deep, rest and elevation eliminate most or all swelling.   - MODERATE edema: Swelling of lower leg to knee, pitting edema > 1/4 inch (6 mm) deep, rest and elevation only partially reduce swelling.   - SEVERE edema: Swelling extends above knee, facial or hand swelling present.      Mild-moderate 4. REDNESS: "Does the swelling look red or infected?"     Red 5. PAIN: "Is the swelling painful to touch?" If Yes, ask: "How painful is it?"   (  Scale 1-10; mild, moderate or severe)     "Not bad" 6. FEVER: "Do you have a fever?" If Yes, ask: "What is it, how was it measured, and when did it start?"      no 7. CAUSE: "What do you think is causing the leg swelling?"     Fluid per patient 8. MEDICAL HISTORY: "Do you have a history of blood clots (e.g., DVT), cancer, heart failure, kidney disease, or liver failure?"     No---"vein doctor ran those scans and there were no blood clots" 9. RECURRENT SYMPTOM: "Have you had leg swelling before?" If Yes, ask: "When was the last time?" "What happened that time?"     no 10. OTHER SYMPTOMS: "Do you have any other symptoms?" (e.g., chest  pain, difficulty breathing)       no  Protocols used: Leg Swelling and Edema-A-AH

## 2023-04-20 NOTE — Telephone Encounter (Signed)
 Patient informed of the message below and voiced understanding

## 2023-04-20 NOTE — Telephone Encounter (Signed)
 Left message for the patient to return my call.

## 2023-04-20 NOTE — Telephone Encounter (Signed)
 Patient informed of the results and voiced understanding. Patient reports redness and pain in left foot and inquired if she should be concerned about this?

## 2023-04-26 DIAGNOSIS — I872 Venous insufficiency (chronic) (peripheral): Secondary | ICD-10-CM | POA: Diagnosis not present

## 2023-04-26 DIAGNOSIS — M79605 Pain in left leg: Secondary | ICD-10-CM | POA: Diagnosis not present

## 2023-04-26 DIAGNOSIS — I83892 Varicose veins of left lower extremities with other complications: Secondary | ICD-10-CM | POA: Diagnosis not present

## 2023-04-26 DIAGNOSIS — R600 Localized edema: Secondary | ICD-10-CM | POA: Diagnosis not present

## 2023-04-27 DIAGNOSIS — M1712 Unilateral primary osteoarthritis, left knee: Secondary | ICD-10-CM | POA: Diagnosis not present

## 2023-05-11 DIAGNOSIS — I83892 Varicose veins of left lower extremities with other complications: Secondary | ICD-10-CM | POA: Diagnosis not present

## 2023-05-16 ENCOUNTER — Ambulatory Visit (INDEPENDENT_AMBULATORY_CARE_PROVIDER_SITE_OTHER): Admitting: Family Medicine

## 2023-05-16 ENCOUNTER — Encounter: Payer: Self-pay | Admitting: Family Medicine

## 2023-05-16 VITALS — BP 148/68 | HR 72 | Temp 97.7°F | Wt 156.9 lb

## 2023-05-16 DIAGNOSIS — E039 Hypothyroidism, unspecified: Secondary | ICD-10-CM

## 2023-05-16 DIAGNOSIS — E1165 Type 2 diabetes mellitus with hyperglycemia: Secondary | ICD-10-CM

## 2023-05-16 DIAGNOSIS — I10 Essential (primary) hypertension: Secondary | ICD-10-CM

## 2023-05-16 DIAGNOSIS — D649 Anemia, unspecified: Secondary | ICD-10-CM | POA: Diagnosis not present

## 2023-05-16 DIAGNOSIS — R233 Spontaneous ecchymoses: Secondary | ICD-10-CM | POA: Diagnosis not present

## 2023-05-16 LAB — CBC WITH DIFFERENTIAL/PLATELET
Basophils Absolute: 0.1 10*3/uL (ref 0.0–0.1)
Basophils Relative: 1 % (ref 0.0–3.0)
Eosinophils Absolute: 0.5 10*3/uL (ref 0.0–0.7)
Eosinophils Relative: 6.4 % — ABNORMAL HIGH (ref 0.0–5.0)
HCT: 32.5 % — ABNORMAL LOW (ref 36.0–46.0)
Hemoglobin: 10.5 g/dL — ABNORMAL LOW (ref 12.0–15.0)
Lymphocytes Relative: 24.8 % (ref 12.0–46.0)
Lymphs Abs: 1.9 10*3/uL (ref 0.7–4.0)
MCHC: 32.3 g/dL (ref 30.0–36.0)
MCV: 89.7 fl (ref 78.0–100.0)
Monocytes Absolute: 0.8 10*3/uL (ref 0.1–1.0)
Monocytes Relative: 10.1 % (ref 3.0–12.0)
Neutro Abs: 4.5 10*3/uL (ref 1.4–7.7)
Neutrophils Relative %: 57.7 % (ref 43.0–77.0)
Platelets: 283 10*3/uL (ref 150.0–400.0)
RBC: 3.63 Mil/uL — ABNORMAL LOW (ref 3.87–5.11)
RDW: 14.2 % (ref 11.5–15.5)
WBC: 7.8 10*3/uL (ref 4.0–10.5)

## 2023-05-16 LAB — PROTIME-INR
INR: 1 ratio (ref 0.8–1.0)
Prothrombin Time: 11.1 s (ref 9.6–13.1)

## 2023-05-16 LAB — APTT: aPTT: 31.2 s (ref 25.4–36.8)

## 2023-05-16 NOTE — Patient Instructions (Signed)
 Let me know if you see any bleeding from gums, urine, stool, or other areas of bruising.

## 2023-05-16 NOTE — Progress Notes (Signed)
 Established Patient Office Visit  Subjective   Patient ID: Madison Hernandez, female    DOB: [birth year]  Age: 83 y.o. MRN: [id number]  Chief Complaint  Patient presents with   Medical Management of Chronic Issues    HPI   Madison Hernandez has history of hypertension, migraine headaches, IBS, hypothyroidism, type 2 diabetes, chronic kidney disease.  Recent A1c back in March improved with 6.4%.  She has hypertension treated with amlodipine , HCTZ, and benazepril .  She states she has been compliant with her medications.  Her blood pressure was up quite high initially today but came down substantially after rest.  Main complaint today is bilateral spontaneous ecchymoses both forearms.  This is confined to the dorsal aspect of both forearms.  Sparing of the arms.  She had 1 bruise recently left thigh but otherwise no bruising lower extremities.  No obvious bruising on the head and neck or upper trunk.  She has not noted any nasal bleeding, gum bleeding, hematuria, or obvious hematochezia.  Denies any recent injury.  Denies any recent change of activity.  Past Medical History:  Diagnosis Date   Acute ischemic colitis (HCC) 1/12   Arthritis    Bleeding ulcer    22 years ago   Blood in stool    Cardiac arrhythmia due to congenital heart disease    Depression    Diverticulitis    Dyslipidemia    Headache(784.0)    Hyperlipidemia    Hypertension    Migraine    UTI (urinary tract infection)    Past Surgical History:  Procedure Laterality Date   bleeding ulcers     22 years ago   vein procedures     right and left lower extremity vein procedures    reports that she quit smoking about 40 years ago. Her smoking use included cigarettes. She started smoking about 50 years ago. She has a 20 pack-year smoking history. She has never used smokeless tobacco. She reports that she does not drink alcohol and does not use drugs. family history includes Diabetes in her mother; Heart disease in her mother;  Hypertension in her mother and sister. Allergies  Allergen Reactions   Avelox [Moxifloxacin Hcl In Nacl] Shortness Of Breath   Mucinex [Guaifenesin Er] Other (See Comments)    "Makes my heart flutter"   Other     Other reaction(s): Unknown   Sulfa  Antibiotics Hives   Albuterol  Swelling and Palpitations   Cefdinir Rash    Review of Systems  Constitutional:  Negative for chills, fever and malaise/fatigue.  Eyes:  Negative for blurred vision.  Respiratory:  Negative for shortness of breath.   Cardiovascular:  Negative for chest pain.  Gastrointestinal:  Negative for blood in stool.  Genitourinary:  Negative for hematuria.  Skin:  Negative for rash.  Neurological:  Negative for dizziness, weakness and headaches.      Objective:     BP (!) 148/68 (BP Location: Left Arm, Cuff Size: Normal)   Pulse 72   Temp 97.7 F (36.5 C) (Oral)   Wt 156 lb 14.4 oz (71.2 kg)   SpO2 96%   BMI 26.93 kg/m  BP Readings from Last 3 Encounters:  05/16/23 (!) 148/68  04/18/23 134/64  04/06/23 138/60   Wt Readings from Last 3 Encounters:  05/16/23 156 lb 14.4 oz (71.2 kg)  04/18/23 158 lb 9.6 oz (71.9 kg)  04/06/23 160 lb (72.6 kg)      Physical Exam Vitals reviewed.  Constitutional:  General: She is not in acute distress.    Appearance: She is not ill-appearing.  Cardiovascular:     Rate and Rhythm: Normal rate and regular rhythm.  Pulmonary:     Effort: Pulmonary effort is normal.     Breath sounds: Normal breath sounds. No wheezing or rales.  Skin:    Findings: Bruising present.     Comments: She does have extensive bruising on both forearms dorsally involving the hands, wrist, and forearms.  Sparing of the arms and also inner aspect of the forearms  Neurological:     Mental Status: She is alert.      No results found for any visits on 05/16/23.  Last CBC Lab Results  Component Value Date   WBC 8.2 12/28/2022   HGB 11.8 (L) 12/28/2022   HCT 36.1 12/28/2022   MCV  91.4 12/28/2022   MCH 28.8 08/05/2016   RDW 13.4 12/28/2022   PLT 295.0 12/28/2022   Last metabolic panel Lab Results  Component Value Date   GLUCOSE 103 (H) 01/13/2023   NA 136 01/13/2023   K 4.0 01/13/2023   CL 104 01/13/2023   CO2 25 01/13/2023   BUN 21 01/13/2023   CREATININE 1.32 (H) 01/13/2023   GFR 38.04 (L) 01/13/2023   CALCIUM  9.6 01/13/2023   PROT 8.0 04/06/2022   ALBUMIN 4.7 04/06/2022   BILITOT 0.4 04/06/2022   ALKPHOS 63 04/06/2022   AST 20 04/06/2022   ALT 15 04/06/2022   ANIONGAP 10 08/05/2016   Last lipids Lab Results  Component Value Date   CHOL 203 (H) 07/07/2022   HDL 72.40 07/07/2022   LDLCALC 100 (H) 07/07/2022   LDLDIRECT 321.0 09/17/2020   TRIG 156.0 (H) 07/07/2022   CHOLHDL 3 07/07/2022   Last hemoglobin A1c Lab Results  Component Value Date   HGBA1C 6.4 (A) 03/29/2023   Last thyroid  functions Lab Results  Component Value Date   TSH 1.89 12/28/2022      The ASCVD Risk score (Arnett DK, et al., 2019) failed to calculate for the following reasons:   The 2019 ASCVD risk score is only valid for ages 74 to 31    Assessment & Plan:   #1 spontaneous ecchymoses.  This is confined to the forearms dorsally.  No generalized bruising.  She does not take any aspirin or anticoagulants.  Doubt worrisome cause given location but obtain CBC, PT, PTT to further assess  #2 hypertension.  Initially but came down substantially after rest.  Continue amlodipine , HCTZ, and benazepril .  Continue low-sodium diet and continue to monitor regularly  #3 type 2 diabetes controlled by recent reading in March 6 0.4%.  Continue low glycemic diet.  She is currently managing this without additional medication  #4 hypothyroidism on replacement.  TSH 4 months ago was stable and at goal   No follow-ups on file.    Glean Lamy, MD

## 2023-05-18 ENCOUNTER — Telehealth: Payer: Self-pay

## 2023-05-18 NOTE — Telephone Encounter (Signed)
 Copied from CRM [id number]. Topic: Clinical - Lab/Test Results >> May 18, 2023  9:23 AM Madison Hernandez wrote: Reason for CRM: Patient returning missed phone call about lab results. Relayed information per providers note, verbatim. Patient has additional questions and request a callback.

## 2023-05-18 NOTE — Addendum Note (Signed)
 Addended by: Aurelio Leer on: 05/18/2023 09:34 AM   Modules accepted: Orders

## 2023-05-18 NOTE — Telephone Encounter (Signed)
 Please see result note

## 2023-05-19 ENCOUNTER — Ambulatory Visit: Payer: Self-pay

## 2023-05-19 DIAGNOSIS — M542 Cervicalgia: Secondary | ICD-10-CM | POA: Diagnosis not present

## 2023-05-19 DIAGNOSIS — R112 Nausea with vomiting, unspecified: Secondary | ICD-10-CM | POA: Diagnosis not present

## 2023-05-19 DIAGNOSIS — M25519 Pain in unspecified shoulder: Secondary | ICD-10-CM | POA: Diagnosis not present

## 2023-05-19 DIAGNOSIS — B349 Viral infection, unspecified: Secondary | ICD-10-CM | POA: Diagnosis not present

## 2023-05-19 DIAGNOSIS — R5383 Other fatigue: Secondary | ICD-10-CM | POA: Diagnosis not present

## 2023-05-19 NOTE — Telephone Encounter (Signed)
  Chief Complaint: feeling ill all over Symptoms: aches,  Frequency: 3 days Pertinent Negatives: Patient denies SOB, difficulty breathing,  Disposition: [] ED /[] Urgent Care (no appt availability in office) / [x] Appointment(In office/virtual)/ []  [facility]/ [] Home Care/ [] Refused Recommended Disposition /[]  Mobile Bus/ []  Follow-up with PCP Additional Notes: Pt states that she is approaching day 3 of being sick. Pt states that she has aches and pain. Pt states that she is currently not in pain. Pt states she will only take aleve . Unknown if she has a temp as she does not have equip. Pt states she does not have covid tests. States she doesn't see anyone so she is unsure how she would have gotten covid. Pt states that she would just like some abx given to her, scheduled for tomorrow.  Copied from CRM [id number]. Topic: Clinical - Red Word Triage >> May 19, 2023  4:24 PM Martinique E wrote: [facility] that prompted transfer to Nurse Triage: Patient experiencing body sweats, chills, body aches and pains, back of her neck is very sore, fever on/off, and cannot sleep. Patient stated OTC medication is not working. Symptoms have been going on 2 days. Reason for Disposition  Muscle aches and fever from a minor viral illness (e.g., common cold)  Answer Assessment - Initial Assessment Questions 1. ONSET: "When did the muscle aches or body pains start?"      About 3 days 2. LOCATION: "What part of your body is hurting?" (e.g., entire body, arms, legs)      everywhere 3. SEVERITY: "How bad is the pain?" (Scale 1-10; or mild, moderate, severe)   - MILD (1-3): doesn't interfere with normal activities    - MODERATE (4-7): interferes with normal activities or awakens from sleep    - SEVERE (8-10):  excruciating pain, unable to do any normal activities      Right now not hurting 4. CAUSE: "What do you think is causing the pains?"     Unsure, common cold 5. FEVER: "Have you been having  fever?"     Unsure, no equip 6. OTHER SYMPTOMS: "Do you have any other symptoms?" (e.g., chest pain, weakness, rash, cold or flu symptoms, weight loss)     denies 8. TRAVEL: "Have you traveled out of the country in the last month?" (e.g., travel history, exposures)     denies  Protocols used: Muscle Aches and Body Pain-A-AH

## 2023-05-20 ENCOUNTER — Other Ambulatory Visit

## 2023-05-20 ENCOUNTER — Encounter: Payer: Self-pay | Admitting: Adult Health

## 2023-05-20 ENCOUNTER — Ambulatory Visit (INDEPENDENT_AMBULATORY_CARE_PROVIDER_SITE_OTHER): Admitting: Adult Health

## 2023-05-20 VITALS — BP 140/80 | HR 86 | Temp 97.8°F | Wt 149.0 lb

## 2023-05-20 DIAGNOSIS — R630 Anorexia: Secondary | ICD-10-CM

## 2023-05-20 DIAGNOSIS — R110 Nausea: Secondary | ICD-10-CM

## 2023-05-20 DIAGNOSIS — R52 Pain, unspecified: Secondary | ICD-10-CM

## 2023-05-20 DIAGNOSIS — R6889 Other general symptoms and signs: Secondary | ICD-10-CM

## 2023-05-20 LAB — POC COVID19 BINAXNOW: SARS Coronavirus 2 Ag: NEGATIVE

## 2023-05-20 LAB — BASIC METABOLIC PANEL WITH GFR
BUN: 35 mg/dL — ABNORMAL HIGH (ref 6–23)
CO2: 24 meq/L (ref 19–32)
Calcium: 10.8 mg/dL — ABNORMAL HIGH (ref 8.4–10.5)
Chloride: 100 meq/L (ref 96–112)
Creatinine, Ser: 1.61 mg/dL — ABNORMAL HIGH (ref 0.40–1.20)
GFR: 29.9 mL/min — ABNORMAL LOW (ref >60.00)
Glucose, Bld: 196 mg/dL — ABNORMAL HIGH (ref 70–99)
Potassium: 4.9 meq/L (ref 3.5–5.1)
Sodium: 135 meq/L (ref 135–145)

## 2023-05-20 LAB — TSH: TSH: 1 u[IU]/mL (ref 0.35–5.50)

## 2023-05-20 NOTE — Progress Notes (Signed)
 Subjective:    Patient ID: Madison Hernandez, female    DOB: 10/10/1940, 83 y.o.   MRN: [id number]  HPI  83 year old female who  has a past medical history of Acute ischemic colitis (HCC) (1/12), Arthritis, Bleeding ulcer, Blood in stool, Cardiac arrhythmia due to congenital heart disease, Depression, Diverticulitis, Dyslipidemia, Headache(784.0), Hyperlipidemia, Hypertension, Migraine, and UTI (urinary tract infection).  She is a patient of Dr. Darren Em who I am seeing today for an acute visit.   She reports that for the last three days she has felt ill.  Her symptoms include that of generalized body aches and joint pain, body aches, feeling " achy", loss of appetite, fatigue, chills and ? Fever. She has been using Aleve  which helps with the body aches.  She denies CP,abdominal pain, shortness of breath, diarrhea, or vomiting. She has not noticed any rashes. Lives in an apartment and does not garden outside. She does not have any pets.  She was seen yesterday at [facility] and had RSV and Flu testing done which was negative. She was prescribed prednisone taper and given zofran for nausea.  a  Review of Systems See HPI   Past Medical History:  Diagnosis Date   Acute ischemic colitis (HCC) 1/12   Arthritis    Bleeding ulcer    22 years ago   Blood in stool    Cardiac arrhythmia due to congenital heart disease    Depression    Diverticulitis    Dyslipidemia    Headache(784.0)    Hyperlipidemia    Hypertension    Migraine    UTI (urinary tract infection)     Social History   Socioeconomic History   Marital status: Single    Spouse name: Not on file   Number of children: Not on file   Years of education: Not on file   Highest education level: Not on file  Occupational History   Not on file  Tobacco Use   Smoking status: Former    Current packs/day: 0.00    Average packs/day: 2.0 packs/day for 10.0 years (20.0 ttl pk-yrs)    Types: Cigarettes    Start date:  01/18/1973    Quit date: 01/19/1983    Years since quitting: 40.3   Smokeless tobacco: Never  Vaping Use   Vaping status: Never Used  Substance and Sexual Activity   Alcohol use: No   Drug use: No   Sexual activity: Not on file  Other Topics Concern   Not on file  Social History Narrative   She retired early. She is non smoker. Never knew her father. Her mother died age 67 of heart disease. Her half sister Elige Gruber   Social Drivers of Health   Financial Resource Strain: Not on file  Food Insecurity: Not on file  Transportation Needs: Not on file  Physical Activity: Not on file  Stress: Not on file  Social Connections: Not on file  Intimate Partner Violence: Not on file    Past Surgical History:  Procedure Laterality Date   bleeding ulcers     22 years ago   vein procedures     right and left lower extremity vein procedures    Family History  Problem Relation Age of Onset   Heart disease Mother        age 84   Diabetes Mother    Hypertension Mother    Hypertension Sister     Allergies  Allergen Reactions   Avelox [  Moxifloxacin Hcl In Nacl] Shortness Of Breath   Mucinex [Guaifenesin Er] Other (See Comments)    "Makes my heart flutter"   Other     Other reaction(s): Unknown   Sulfa  Antibiotics Hives   Albuterol  Swelling and Palpitations   Cefdinir Rash    Current Outpatient Medications on File Prior to Visit  Medication Sig Dispense Refill   amLODipine  (NORVASC ) 5 MG tablet Take 1 tablet (5 mg total) by mouth daily. 90 tablet 3   benazepril  (LOTENSIN ) 20 MG tablet TAKE 1 TABLET BY MOUTH DAILY 100 tablet 2   furosemide  (LASIX ) 20 MG tablet TAKE 1 TABLET BY MOUTH DAILY AS NEEDED 90 tablet 0   gabapentin (NEURONTIN) 300 MG capsule TAKE 1 CAPSULE 3 TIMES A DAY     hydrochlorothiazide  (MICROZIDE ) 12.5 MG capsule Take 1 capsule (12.5 mg total) by mouth daily. 90 capsule 3   ketoconazole  (NIZORAL ) 2 % cream Apply 1 Application topically daily. 15 g 2    levothyroxine  (SYNTHROID ) 25 MCG tablet TAKE 1 TABLET BY MOUTH DAILY  BEFORE BREAKFAST 90 tablet 1   LINZESS  145 MCG CAPS capsule TAKE 1 CAPSULE BY MOUTH DAILY  BEFORE BREAKFAST 90 capsule 1   metroNIDAZOLE  (METROCREAM ) 0.75 % cream Apply topically 2 (two) times daily. 45 g 1   Naproxen  Sodium (ALEVE ) 220 MG CAPS as needed.     nystatin  (MYCOSTATIN ) 100000 UNIT/ML suspension Take 5 ml and swish, gargle, and spit four times daily 240 mL 0   RESTASIS 0.05 % ophthalmic emulsion as directed.     rosuvastatin  (CRESTOR ) 40 MG tablet Take 1 tablet (40 mg total) by mouth daily. 90 tablet 3   No current facility-administered medications on file prior to visit.    BP (!) 140/80   Pulse 86   Temp 97.8 F (36.6 C) (Oral)   Wt 149 lb (67.6 kg)   SpO2 97%   BMI 25.58 kg/m       Objective:   Physical Exam Vitals and nursing note reviewed.  Constitutional:      Appearance: Normal appearance.  HENT:     Nose: Nose normal. No congestion or rhinorrhea.     Right Sinus: No maxillary sinus tenderness or frontal sinus tenderness.     Left Sinus: No maxillary sinus tenderness or frontal sinus tenderness.     Mouth/Throat:     Mouth: Mucous membranes are moist.     Pharynx: Oropharynx is clear.  Cardiovascular:     Rate and Rhythm: Normal rate and regular rhythm.     Pulses: Normal pulses.     Heart sounds: Normal heart sounds.  Pulmonary:     Effort: Pulmonary effort is normal.     Breath sounds: Normal breath sounds.  Abdominal:     General: Abdomen is flat. Bowel sounds are normal.     Palpations: Abdomen is soft.  Skin:    General: Skin is warm and dry.     Findings: Bruising present.  Neurological:     General: No focal deficit present.     Mental Status: She is alert and oriented to person, place, and time.  Psychiatric:        Mood and Affect: Mood normal.        Behavior: Behavior normal.        Thought Content: Thought content normal.        Judgment: Judgment normal.        Assessment & Plan:  1. Flu-like symptoms (Primary) - Her exam was  benign today. I am not concerned for tick born illness. No rashes seen on exam. Likely viral illness and should resolve in the next few days. Will check CBC, BMP and TSH since she is on thyroid  medication although I doubt this is a cause. She is having iron and B12 checked today by her PCP for bruising on her arms.  - POC COVID-19- negative  - CBC; Future - Basic Metabolic Panel; Future - TSH; Future  2. Body aches  - POC COVID-19 - CBC; Future - Basic Metabolic Panel; Future - TSH; Future  3. Loss of appetite  - POC COVID-19 - CBC; Future - Basic Metabolic Panel; Future - TSH; Future  4. Nausea - Can take prescribed Zofran  - POC COVID-19 - CBC; Future - Basic Metabolic Panel; Future - TSH; Future  Time spent with patient today was 31 minutes which consisted of chart review, discussing body aches, loss of appetite, viral illness,  work up, treatment ,answering questions and documentation.

## 2023-05-20 NOTE — Patient Instructions (Signed)
 It was nice meeting you today   Your Covid test was negative   I think you have a viral illness that will pass.   I am going to check some lab work on you today

## 2023-05-21 LAB — CBC
HCT: 36.2 % (ref 36.0–46.0)
Hemoglobin: 11.6 g/dL — ABNORMAL LOW (ref 12.0–15.0)
MCHC: 32.2 g/dL (ref 30.0–36.0)
MCV: 89.6 fl (ref 78.0–100.0)
Platelets: 349 10*3/uL (ref 150.0–400.0)
RBC: 4.04 Mil/uL (ref 3.87–5.11)
RDW: 14.4 % (ref 11.5–15.5)
WBC: 11.5 10*3/uL — ABNORMAL HIGH (ref 4.0–10.5)

## 2023-05-23 ENCOUNTER — Other Ambulatory Visit: Payer: Self-pay | Admitting: Family Medicine

## 2023-05-25 ENCOUNTER — Ambulatory Visit (INDEPENDENT_AMBULATORY_CARE_PROVIDER_SITE_OTHER): Admitting: Family Medicine

## 2023-05-25 ENCOUNTER — Telehealth: Payer: Self-pay

## 2023-05-25 ENCOUNTER — Encounter: Payer: Self-pay | Admitting: Family Medicine

## 2023-05-25 VITALS — BP 138/70 | HR 89 | Temp 98.1°F | Wt 148.0 lb

## 2023-05-25 DIAGNOSIS — D649 Anemia, unspecified: Secondary | ICD-10-CM

## 2023-05-25 DIAGNOSIS — R6883 Chills (without fever): Secondary | ICD-10-CM

## 2023-05-25 DIAGNOSIS — E1165 Type 2 diabetes mellitus with hyperglycemia: Secondary | ICD-10-CM | POA: Diagnosis not present

## 2023-05-25 DIAGNOSIS — M791 Myalgia, unspecified site: Secondary | ICD-10-CM

## 2023-05-25 LAB — VITAMIN B12: Vitamin B-12: 425 pg/mL (ref 211–911)

## 2023-05-25 LAB — POCT GLYCOSYLATED HEMOGLOBIN (HGB A1C): Hemoglobin A1C: 6.6 % — AB (ref 4.0–5.6)

## 2023-05-25 LAB — SEDIMENTATION RATE: Sed Rate: 39 mm/h — ABNORMAL HIGH (ref 0–30)

## 2023-05-25 NOTE — Progress Notes (Signed)
 Established Patient Office Visit  Subjective   Patient ID: Madison Hernandez, female    DOB: 07-Sep-1940  Age: 83 y.o. MRN: [id number]  Chief Complaint  Patient presents with   Dizziness   Nausea   Chills   Muscle Pain    HPI   Canei is seen today for follow-up regarding multiple recent vague symptoms including some lightheadedness, decreased appetite, upper neck and upper back muscle pain, nausea without vomiting.  She apparently went to urgent care on May 1.  RSV and flu testing negative.  She apparently was prescribed prednisone but not sure regarding indication.  She was subsequently seen here the next day.  Had labs including COVID testing, CBC, basic metabolic panel, TSH.  Her white count was slightly high but she had been recently on some prednisone.  Glucose 196.  She does have history of type 2 diabetes which is generally been well-controlled without medication recently.  She denies any fever during this time.  No burning with urination.  No abdominal pain.  No headaches.  No recent tick bites.  Denies any diarrhea or other stool changes.  She states she has not been drinking a lot of fluids but did manage yesterday ate a biscuit and apparently cheeseburger.  She does relate some diffuse myalgias.  Is on Crestor  has been on this for quite some time.  She does not recall any lessening of her myalgias when she was taking the prednisone recently.  She has had some weight loss of about 8 pounds since she was seen by me last visit.  Her weight has fluctuated considerably in the past  Past Medical History:  Diagnosis Date   Acute ischemic colitis (HCC) 1/12   Arthritis    Bleeding ulcer    22 years ago   Blood in stool    Cardiac arrhythmia due to congenital heart disease    Depression    Diverticulitis    Dyslipidemia    Headache(784.0)    Hyperlipidemia    Hypertension    Migraine    UTI (urinary tract infection)    Past Surgical History:  Procedure Laterality Date    bleeding ulcers     22 years ago   vein procedures     right and left lower extremity vein procedures    reports that she quit smoking about 40 years ago. Her smoking use included cigarettes. She started smoking about 50 years ago. She has a 20 pack-year smoking history. She has never used smokeless tobacco. She reports that she does not drink alcohol and does not use drugs. family history includes Diabetes in her mother; Heart disease in her mother; Hypertension in her mother and sister. Allergies  Allergen Reactions   Avelox [Moxifloxacin Hcl In Nacl] Shortness Of Breath   Mucinex [Guaifenesin Er] Other (See Comments)    "Makes my heart flutter"   Other     Other reaction(s): Unknown   Sulfa  Antibiotics Hives   Albuterol  Swelling and Palpitations   Cefdinir Rash    Review of Systems  Constitutional:  Positive for malaise/fatigue and weight loss. Negative for chills and fever.  HENT:  Negative for sore throat.   Respiratory:  Negative for cough, shortness of breath and wheezing.   Cardiovascular:  Negative for chest pain and leg swelling.  Gastrointestinal:  Positive for nausea. Negative for abdominal pain, blood in stool, constipation, diarrhea, melena and vomiting.  Genitourinary:  Negative for dysuria, flank pain and hematuria.  Skin:  Negative for rash.  Neurological:  Negative for headaches.      Objective:     BP 138/70 (BP Location: Left Arm, Patient Position: Sitting, Cuff Size: Normal)   Pulse 89   Temp 98.1 F (36.7 C) (Oral)   Wt 148 lb (67.1 kg)   SpO2 97%   BMI 25.40 kg/m  BP Readings from Last 3 Encounters:  05/25/23 138/70  05/20/23 (!) 140/80  05/16/23 (!) 148/68   Wt Readings from Last 3 Encounters:  05/25/23 148 lb (67.1 kg)  05/20/23 149 lb (67.6 kg)  05/16/23 156 lb 14.4 oz (71.2 kg)      Physical Exam Vitals reviewed.  Constitutional:      General: She is not in acute distress.    Appearance: She is not ill-appearing.  HENT:     Right  Ear: Tympanic membrane normal.     Left Ear: Tympanic membrane normal.     Mouth/Throat:     Mouth: Mucous membranes are moist.     Pharynx: Oropharynx is clear. No oropharyngeal exudate or posterior oropharyngeal erythema.  Cardiovascular:     Rate and Rhythm: Normal rate and regular rhythm.  Pulmonary:     Effort: Pulmonary effort is normal.     Breath sounds: Normal breath sounds. No wheezing or rales.  Abdominal:     Palpations: Abdomen is soft.     Tenderness: There is no abdominal tenderness. There is no guarding.  Musculoskeletal:     Right lower leg: No edema.     Left lower leg: No edema.  Skin:    Findings: No rash.  Neurological:     Mental Status: She is alert.      Results for orders placed or performed in visit on 05/25/23  POC HgB A1c  Result Value Ref Range   Hemoglobin A1C 6.6 (A) 4.0 - 5.6 %   HbA1c POC (<> result, manual entry)     HbA1c, POC (prediabetic range)     HbA1c, POC (controlled diabetic range)      Last CBC Lab Results  Component Value Date   WBC 11.5 (H) 05/20/2023   HGB 11.6 (L) 05/20/2023   HCT 36.2 05/20/2023   MCV 89.6 05/20/2023   MCH 28.8 08/05/2016   RDW 14.4 05/20/2023   PLT 349.0 05/20/2023   Last metabolic panel Lab Results  Component Value Date   GLUCOSE 196 (H) 05/20/2023   NA 135 05/20/2023   K 4.9 05/20/2023   CL 100 05/20/2023   CO2 24 05/20/2023   BUN 35 (H) 05/20/2023   CREATININE 1.61 (H) 05/20/2023   GFR 29.90 (L) 05/20/2023   CALCIUM  10.8 (H) 05/20/2023   PROT 8.0 04/06/2022   ALBUMIN 4.7 04/06/2022   BILITOT 0.4 04/06/2022   ALKPHOS 63 04/06/2022   AST 20 04/06/2022   ALT 15 04/06/2022   ANIONGAP 10 08/05/2016   Last hemoglobin A1c Lab Results  Component Value Date   HGBA1C 6.6 (A) 05/25/2023   Last thyroid  functions Lab Results  Component Value Date   TSH 1.00 05/20/2023      The ASCVD Risk score (Arnett DK, et al., 2019) failed to calculate for the following reasons:   The 2019 ASCVD  risk score is only valid for ages 35 to 85    Assessment & Plan:   Problem List Items Addressed This Visit       Unprioritized   Type 2 diabetes mellitus with hyperglycemia (HCC) - Primary   Relevant Orders   POC HgB A1c (Completed)  Other Visit Diagnoses       Myalgia       Relevant Orders   Sedimentation rate     Anemia, unspecified type       Relevant Orders   Vitamin B12   Iron, TIBC and Ferritin Panel     Chills       Relevant Orders   Urinalysis with Culture Reflex     Low hemoglobin         Lillar is seen today with several nonspecific symptoms as above.  Onset about a week ago some lightheadedness, decreased appetite, nausea without vomiting, myalgias.  Her myalgias are predominantly neck and upper back.  Query etiology such as polymyalgia rheumatica but she was placed recently on prednisone per urgent care and did not see any abatement of her myalgias.  She had recent CBC with slightly elevated white count but this was likely related to prednisone.  She does have history of mild chronic anemia.  Nontoxic in appearance.  No fever.  Nonfocal exam.  We recommend the following  -Check sed rate though may still be suppressed from recent prednisone - Check B12 and iron studies in view of her recent mild normocytic anemia - Check urinalysis with reflex to culture although she is not having any obvious urinary symptoms. - Recheck A1c today which was 6.6% which is stable.  She is currently not on any diabetes medications.  Continue periodic monitoring.  Recent bump in glucose probably related to prednisone - Follow-up immediately for any fever or new symptoms  No follow-ups on file.    Glean Lamy, MD

## 2023-05-25 NOTE — Telephone Encounter (Signed)
 Noted.

## 2023-05-25 NOTE — Telephone Encounter (Signed)
 Copied from CRM [id number]. Topic: Clinical - Lab/Test Results >> May 25, 2023  8:30 AM Juluis Ok wrote: Reason for CRM: Relayed lab results per provider's note, verbatim. Appointment scheduled for patient.

## 2023-05-26 DIAGNOSIS — M542 Cervicalgia: Secondary | ICD-10-CM | POA: Diagnosis not present

## 2023-05-26 DIAGNOSIS — M170 Bilateral primary osteoarthritis of knee: Secondary | ICD-10-CM | POA: Diagnosis not present

## 2023-05-26 DIAGNOSIS — Z79899 Other long term (current) drug therapy: Secondary | ICD-10-CM | POA: Diagnosis not present

## 2023-05-26 DIAGNOSIS — M546 Pain in thoracic spine: Secondary | ICD-10-CM | POA: Diagnosis not present

## 2023-05-26 LAB — IRON,TIBC AND FERRITIN PANEL
%SAT: 14 % — ABNORMAL LOW (ref 16–45)
Ferritin: 89 ng/mL (ref 16–288)
Iron: 53 ug/dL (ref 45–160)
TIBC: 370 ug/dL (ref 250–450)

## 2023-05-27 LAB — URINE CULTURE
MICRO NUMBER:: 16429892
SPECIMEN QUALITY:: ADEQUATE

## 2023-05-27 LAB — URINALYSIS W MICROSCOPIC + REFLEX CULTURE
Bacteria, UA: NONE SEEN /HPF
Bilirubin Urine: NEGATIVE
Glucose, UA: NEGATIVE
Hgb urine dipstick: NEGATIVE
Ketones, ur: NEGATIVE
Nitrites, Initial: NEGATIVE
Protein, ur: NEGATIVE
RBC / HPF: NONE SEEN /HPF (ref 0–2)
Specific Gravity, Urine: 1.013 (ref 1.001–1.035)
pH: <5 — ABNORMAL LOW (ref 5.0–8.0)

## 2023-05-27 LAB — CULTURE INDICATED

## 2023-05-31 DIAGNOSIS — I83892 Varicose veins of left lower extremities with other complications: Secondary | ICD-10-CM | POA: Diagnosis not present

## 2023-06-02 DIAGNOSIS — M170 Bilateral primary osteoarthritis of knee: Secondary | ICD-10-CM | POA: Diagnosis not present

## 2023-06-08 DIAGNOSIS — M170 Bilateral primary osteoarthritis of knee: Secondary | ICD-10-CM | POA: Diagnosis not present

## 2023-06-16 DIAGNOSIS — M170 Bilateral primary osteoarthritis of knee: Secondary | ICD-10-CM | POA: Diagnosis not present

## 2023-07-04 ENCOUNTER — Other Ambulatory Visit: Payer: Self-pay

## 2023-07-04 MED ORDER — AMLODIPINE BESYLATE 5 MG PO TABS: 5.0000 mg | ORAL_TABLET | Freq: Every day | ORAL | 1 refills | Status: DC

## 2023-07-04 MED ORDER — HYDROCHLOROTHIAZIDE 12.5 MG PO CAPS
12.5000 mg | ORAL_CAPSULE | Freq: Every day | ORAL | 1 refills | Status: DC
Start: 2023-07-04 — End: 2023-09-12

## 2023-07-06 ENCOUNTER — Other Ambulatory Visit: Payer: Self-pay

## 2023-07-06 MED ORDER — GABAPENTIN 300 MG PO CAPS: ORAL_CAPSULE | ORAL | 0 refills | Status: DC

## 2023-07-08 ENCOUNTER — Encounter: Payer: Self-pay | Admitting: Family Medicine

## 2023-07-08 ENCOUNTER — Ambulatory Visit: Admitting: Family Medicine

## 2023-07-08 VITALS — BP 134/66 | HR 86 | Temp 97.6°F | Wt 151.4 lb

## 2023-07-08 DIAGNOSIS — I10 Essential (primary) hypertension: Secondary | ICD-10-CM | POA: Diagnosis not present

## 2023-07-08 DIAGNOSIS — E039 Hypothyroidism, unspecified: Secondary | ICD-10-CM | POA: Diagnosis not present

## 2023-07-08 DIAGNOSIS — E782 Mixed hyperlipidemia: Secondary | ICD-10-CM | POA: Diagnosis not present

## 2023-07-08 DIAGNOSIS — J988 Other specified respiratory disorders: Secondary | ICD-10-CM

## 2023-07-08 DIAGNOSIS — E1165 Type 2 diabetes mellitus with hyperglycemia: Secondary | ICD-10-CM | POA: Diagnosis not present

## 2023-07-08 LAB — HEPATIC FUNCTION PANEL
ALT: 17 U/L (ref 0–35)
AST: 22 U/L (ref 0–37)
Albumin: 4.8 g/dL (ref 3.5–5.2)
Alkaline Phosphatase: 77 U/L (ref 39–117)
Bilirubin, Direct: 0 mg/dL (ref 0.0–0.3)
Total Bilirubin: 0.4 mg/dL (ref 0.2–1.2)
Total Protein: 7.9 g/dL (ref 6.0–8.3)

## 2023-07-08 LAB — LIPID PANEL
Cholesterol: 213 mg/dL — ABNORMAL HIGH (ref 0–200)
HDL: 63.4 mg/dL (ref >39.00)
LDL Cholesterol: 124 mg/dL — ABNORMAL HIGH (ref 0–99)
NonHDL: 149.78
Total CHOL/HDL Ratio: 3
Triglycerides: 131 mg/dL (ref 0.0–149.0)
VLDL: 26.2 mg/dL (ref 0.0–40.0)

## 2023-07-08 MED ORDER — LEVOTHYROXINE SODIUM 25 MCG PO TABS: 25.0000 ug | ORAL_TABLET | Freq: Every day | ORAL | 3 refills | Status: AC

## 2023-07-08 NOTE — Progress Notes (Signed)
 Established Patient Office Visit  Subjective   Patient ID: Madison Hernandez, female    DOB: [birth year]  Age: 83 y.o. MRN: [id number]  Chief Complaint  Patient presents with   Medical Management of Chronic Issues    HPI   Madison Hernandez is seen today for medical follow-up.  Madison Hernandez has history of hypertension, migraine headaches, PVCs, IBS, hypothyroidism, type 2 diabetes, osteoarthritis, chronic kidney disease, hyperlipidemia Madison Hernandez states Madison Hernandez is needing refills of her levothyroxine .  Madison Hernandez had levels checked just recently in May which were normal.  Madison Hernandez states Madison Hernandez has had some green to yellow sputum that comes up for the past few weeks but Madison Hernandez feels like this is improving.  Madison Hernandez denies an actual cough.  No hemoptysis.  No dyspnea.  Denies any active GERD or reflux symptoms.  Madison Hernandez has good appetite.  Quit smoking age 72.  Denies any chronic sinusitis symptoms.  Type 2 diabetes.  Madison Hernandez has had recent steroid injections per orthopedics and had A1c of 6.6% which is relatively stable.  Blood sugars currently managed without medication.  Madison Hernandez has hyperlipidemia on high-dose statin with rosuvastatin  40 mg daily.  Madison Hernandez thinks some of her intermittent aches and pains may be related to the rosuvastatin .  No consistent myalgias.  Madison Hernandez has hypertension treated with benazepril  and amlodipine .  Madison Hernandez states Madison Hernandez is compliant with therapy  Past Medical History:  Diagnosis Date   Acute ischemic colitis (HCC) 1/12   Arthritis    Bleeding ulcer    22 years ago   Blood in stool    Cardiac arrhythmia due to congenital heart disease    Depression    Diverticulitis    Dyslipidemia    Headache(784.0)    Hyperlipidemia    Hypertension    Migraine    UTI (urinary tract infection)    Past Surgical History:  Procedure Laterality Date   bleeding ulcers     22 years ago   vein procedures     right and left lower extremity vein procedures    reports that Madison Hernandez quit smoking about 40 years ago. Her smoking use included cigarettes.  Madison Hernandez started smoking about 50 years ago. Madison Hernandez has a 20 pack-year smoking history. Madison Hernandez has never used smokeless tobacco. Madison Hernandez reports that Madison Hernandez does not drink alcohol and does not use drugs. family history includes Diabetes in her mother; Heart disease in her mother; Hypertension in her mother and sister. Allergies  Allergen Reactions   Avelox [Moxifloxacin Hcl In Nacl] Shortness Of Breath   Mucinex [Guaifenesin Er] Other (See Comments)    Makes my heart flutter   Other     Other reaction(s): Unknown   Sulfa  Antibiotics Hives   Albuterol  Swelling and Palpitations   Cefdinir Rash    Review of Systems  Constitutional:  Negative for chills and fever.  Eyes:  Negative for blurred vision.  Respiratory:  Negative for shortness of breath.   Cardiovascular:  Negative for chest pain.  Gastrointestinal:  Negative for abdominal pain.  Neurological:  Negative for dizziness, weakness and headaches.      Objective:     BP 134/66 (BP Location: Left Arm, Patient Position: Sitting, Cuff Size: Normal)   Pulse 86   Temp 97.6 F (36.4 C) (Oral)   Wt 151 lb 6.4 oz (68.7 kg)   SpO2 95%   BMI 25.99 kg/m  BP Readings from Last 3 Encounters:  07/08/23 134/66  05/25/23 138/70  05/20/23 (!) 140/80   Wt Readings from Last 3 Encounters:  07/08/23 151 lb 6.4 oz (68.7 kg)  05/25/23 148 lb (67.1 kg)  05/20/23 149 lb (67.6 kg)      Physical Exam Vitals reviewed.  Constitutional:      General: Madison Hernandez is not in acute distress.    Appearance: Madison Hernandez is not ill-appearing.   Cardiovascular:     Rate and Rhythm: Normal rate and regular rhythm.  Pulmonary:     Effort: Pulmonary effort is normal.     Breath sounds: Normal breath sounds. No wheezing or rales.   Musculoskeletal:     Right lower leg: No edema.     Left lower leg: No edema.   Neurological:     Mental Status: Madison Hernandez is alert.      No results found for any visits on 07/08/23.  Last CBC Lab Results  Component Value Date   WBC 11.5 (H)  05/20/2023   HGB 11.6 (L) 05/20/2023   HCT 36.2 05/20/2023   MCV 89.6 05/20/2023   MCH 28.8 08/05/2016   RDW 14.4 05/20/2023   PLT 349.0 05/20/2023   Last metabolic panel Lab Results  Component Value Date   GLUCOSE 196 (H) 05/20/2023   NA 135 05/20/2023   K 4.9 05/20/2023   CL 100 05/20/2023   CO2 24 05/20/2023   BUN 35 (H) 05/20/2023   CREATININE 1.61 (H) 05/20/2023   GFR 29.90 (L) 05/20/2023   CALCIUM  10.8 (H) 05/20/2023   PROT 8.0 04/06/2022   ALBUMIN 4.7 04/06/2022   BILITOT 0.4 04/06/2022   ALKPHOS 63 04/06/2022   AST 20 04/06/2022   ALT 15 04/06/2022   ANIONGAP 10 08/05/2016   Last hemoglobin A1c Lab Results  Component Value Date   HGBA1C 6.6 (A) 05/25/2023   Last thyroid  functions Lab Results  Component Value Date   TSH 1.00 05/20/2023      The ASCVD Risk score (Arnett DK, et al., 2019) failed to calculate for the following reasons:   The 2019 ASCVD risk score is only valid for ages 34 to 64    Assessment & Plan:   #1 upper airway mucus/congestion.  Patient denies any actual cough.  Madison Hernandez is denying any active reflux symptoms.  Question related to recent acute respiratory illness.  Madison Hernandez has normal lung exam.  No fever.  Observe for now since her symptoms are improving  #2 hypertension stable and well-controlled.  Continue amlodipine  and benazepril  at current dosage.  Continue low-sodium diet  #3 hypothyroidism.  Patient needing refills of levothyroxine .  Levels were just checked and normal.  Refill for 1 year through [facility] home delivery  #4 hyperlipidemia.  On high-dose statin with rosuvastatin  40 mg daily.  Check lipid and hepatic panel today.   Return in about 6 months (around 01/07/2024).    Glean Lamy, MD

## 2023-07-10 ENCOUNTER — Ambulatory Visit: Payer: Self-pay | Admitting: Family Medicine

## 2023-07-13 ENCOUNTER — Other Ambulatory Visit: Payer: Self-pay | Admitting: Family Medicine

## 2023-07-13 DIAGNOSIS — K5900 Constipation, unspecified: Secondary | ICD-10-CM

## 2023-07-26 DIAGNOSIS — M542 Cervicalgia: Secondary | ICD-10-CM | POA: Diagnosis not present

## 2023-08-02 DIAGNOSIS — M170 Bilateral primary osteoarthritis of knee: Secondary | ICD-10-CM | POA: Diagnosis not present

## 2023-08-03 ENCOUNTER — Other Ambulatory Visit: Payer: Self-pay | Admitting: Family Medicine

## 2023-08-05 ENCOUNTER — Ambulatory Visit: Payer: Self-pay

## 2023-08-05 NOTE — Telephone Encounter (Signed)
 FYI Only or Action Required?: Action required by provider: request for appointment.  Patient was last seen in primary care on 07/08/2023 by Micheal Wolm ORN, MD.  Called Nurse Triage reporting Cough.  Symptoms began several weeks ago.  Interventions attempted: Nothing.  Symptoms are: unchanged.  Triage Disposition: See PCP When Office is Open (Within 3 Days)  Patient/caregiver understands and will follow disposition?: YesCopied from CRM [id number]. Topic: Clinical - Red Word Triage >> Aug 05, 2023 11:24 AM Adelita E wrote: [facility] that prompted transfer to Nurse Triage: Patient has been having some green phlegm, but no cough. Been going on for a couple of weeks. Reason for Disposition  Cough has been present for > 3 weeks  Answer Assessment - Initial Assessment Questions 1. ONSET: When did the cough begin?      2-3 weeks   3. SPUTUM: Describe the color of your sputum (e.g., none, dry cough; clear, white, yellow, green)     Green and yellow  5. DIFFICULTY BREATHING: Are you having difficulty breathing? If Yes, ask: How bad is it? (e.g., mild, moderate, severe)      denies 6. FEVER: Do you have a fever? If Yes, ask: What is your temperature, how was it measured, and when did it start?     denies  8. LUNG HISTORY: Do you have any history of lung disease?  (e.g., pulmonary embolus, asthma, emphysema)     denies  10. OTHER SYMPTOMS: Do you have any other symptoms? (e.g., runny nose, wheezing, chest pain)       denies    Coughing mucus up on and off throughout day. Mucus is green to yellow. Pt denies any difficulty breathing. Pt has not tried any OTC medication.  Protocols used: Cough - Acute Productive-A-AH

## 2023-08-08 ENCOUNTER — Encounter: Payer: Self-pay | Admitting: Family Medicine

## 2023-08-08 ENCOUNTER — Ambulatory Visit (INDEPENDENT_AMBULATORY_CARE_PROVIDER_SITE_OTHER): Admitting: Family Medicine

## 2023-08-08 VITALS — BP 124/78 | HR 72 | Temp 97.6°F | Wt 150.6 lb

## 2023-08-08 DIAGNOSIS — J019 Acute sinusitis, unspecified: Secondary | ICD-10-CM | POA: Diagnosis not present

## 2023-08-08 MED ORDER — DOXYCYCLINE HYCLATE 100 MG PO TABS
100.0000 mg | ORAL_TABLET | Freq: Two times a day (BID) | ORAL | 0 refills | Status: DC
Start: 2023-08-08 — End: 2023-09-18

## 2023-08-08 NOTE — Progress Notes (Signed)
   Subjective:    Patient ID: Madison Hernandez, female    DOB: [birth year], 83 y.o.   MRN: [id number]  HPI Here for 2 weeks of PND and coughing up green sputum. No fever or ST or SOB.    Review of Systems  Constitutional: Negative.   HENT:  Positive for congestion and postnasal drip. Negative for ear pain, sinus pain and sore throat.   Eyes: Negative.   Respiratory:  Positive for cough. Negative for shortness of breath and wheezing.        Objective:   Physical Exam Constitutional:      Appearance: Normal appearance.  HENT:     Right Ear: Ear canal and external ear normal.     Left Ear: Tympanic membrane, ear canal and external ear normal.     Nose: Nose normal.     Mouth/Throat:     Pharynx: Oropharynx is clear.  Eyes:     Conjunctiva/sclera: Conjunctivae normal.  Pulmonary:     Effort: Pulmonary effort is normal.     Breath sounds: Normal breath sounds.  Lymphadenopathy:     Cervical: No cervical adenopathy.  Neurological:     Mental Status: She is alert.           Assessment & Plan:  Sinusitis, treat with 10 days of Doxycycline .  Garnette Olmsted, MD

## 2023-08-28 DIAGNOSIS — I10 Essential (primary) hypertension: Secondary | ICD-10-CM | POA: Diagnosis not present

## 2023-08-28 DIAGNOSIS — J019 Acute sinusitis, unspecified: Secondary | ICD-10-CM | POA: Diagnosis not present

## 2023-09-05 DIAGNOSIS — M1712 Unilateral primary osteoarthritis, left knee: Secondary | ICD-10-CM | POA: Diagnosis not present

## 2023-09-05 DIAGNOSIS — Z5181 Encounter for therapeutic drug level monitoring: Secondary | ICD-10-CM | POA: Diagnosis not present

## 2023-09-06 DIAGNOSIS — T148XXA Other injury of unspecified body region, initial encounter: Secondary | ICD-10-CM | POA: Diagnosis not present

## 2023-09-08 ENCOUNTER — Encounter: Payer: Self-pay | Admitting: Family Medicine

## 2023-09-08 ENCOUNTER — Ambulatory Visit: Payer: Self-pay | Admitting: *Deleted

## 2023-09-08 ENCOUNTER — Ambulatory Visit (INDEPENDENT_AMBULATORY_CARE_PROVIDER_SITE_OTHER): Admitting: Family Medicine

## 2023-09-08 VITALS — BP 132/74 | HR 91 | Temp 98.2°F | Ht 64.0 in | Wt 153.6 lb

## 2023-09-08 DIAGNOSIS — S61419A Laceration without foreign body of unspecified hand, initial encounter: Secondary | ICD-10-CM | POA: Diagnosis not present

## 2023-09-08 DIAGNOSIS — Z23 Encounter for immunization: Secondary | ICD-10-CM

## 2023-09-08 NOTE — Progress Notes (Signed)
 Established Patient Office Visit   Subjective  Patient ID: Madison Hernandez, female    DOB: 10/13/1940  Age: 83 y.o. MRN: [id number]  Chief Complaint  Patient presents with   Acute Visit    Patient had a Fall 1 day ago, and hit her left hand, patient has a wound, with bruising, and swelling     Pt is an 83 yo female followed by Dr. Micheal who presents for acute concern.  Pt fell off a stool while trying to reach a bowl in her kitchen cabinet yesterday evening.  Thinks it may have been around 6 pm.  Dorsum of L hand with skin flap. Cleaned area, applied antibiotic ointment, and dressed wound.  Pt notes bruising on L forearm that she tried covering with makeup this am.  Pt denies pain in hand, drainage of wound, redness, fever, chills.  Patient is right-handed.    Patient Active Problem List   Diagnosis Date Noted   IBS (irritable bowel syndrome) 01/15/2022   Type 2 diabetes mellitus with hyperglycemia (HCC) 04/15/2021   Constipation 01/25/2020   Diverticular disease of colon 01/25/2020   Left lower quadrant pain 01/25/2020   Hypothyroid 02/15/2018   Rosacea 01/07/2017   PVC (premature ventricular contraction) 06/17/2016   Osteoarthritis, multiple sites 11/06/2013   Chronic kidney disease 03/14/2012   Hyperglycemia 02/14/2012   Edema 07/21/2011   Fatigue 01/21/2011   History of depression 08/21/2010   Acute ischemic colitis (HCC) 08/21/2010   Migraine headache 08/21/2010   Abnormal ECG 07/08/2010   HTN (hypertension) 07/08/2010   Mixed hyperlipidemia 07/08/2010   Abnormal ECG 07/08/2010   Palpitations 07/08/2010   Past Medical History:  Diagnosis Date   Acute ischemic colitis (HCC) 1/12   Arthritis    Bleeding ulcer    22 years ago   Blood in stool    Cardiac arrhythmia due to congenital heart disease    Depression    Diverticulitis    Dyslipidemia    Headache(784.0)    Hyperlipidemia    Hypertension    Migraine    UTI (urinary tract infection)    Past  Surgical History:  Procedure Laterality Date   bleeding ulcers     22 years ago   vein procedures     right and left lower extremity vein procedures   Social History   Tobacco Use   Smoking status: Former    Current packs/day: 0.00    Average packs/day: 2.0 packs/day for 10.0 years (20.0 ttl pk-yrs)    Types: Cigarettes    Start date: 01/18/1973    Quit date: 01/19/1983    Years since quitting: 40.6   Smokeless tobacco: Never  Vaping Use   Vaping status: Never Used  Substance Use Topics   Alcohol use: No   Drug use: No   Family History  Problem Relation Age of Onset   Heart disease Mother        age 71   Diabetes Mother    Hypertension Mother    Hypertension Sister    Allergies  Allergen Reactions   Avelox [Moxifloxacin Hcl In Nacl] Shortness Of Breath   Mucinex [Guaifenesin Er] Other (See Comments)    Makes my heart flutter   Other     Other reaction(s): Unknown   Sulfa  Antibiotics Hives   Albuterol  Swelling and Palpitations   Cefdinir Rash    ROS Negative unless stated above    Objective:     BP 132/74 (BP Location: Right Arm, Patient Position:  Sitting, Cuff Size: Normal)   Pulse 91   Temp 98.2 F (36.8 C) (Oral)   Ht 5' 4 (1.626 m)   Wt 153 lb 9.6 oz (69.7 kg)   SpO2 99%   BMI 26.37 kg/m  BP Readings from Last 3 Encounters:  09/08/23 132/74  08/08/23 124/78  07/08/23 134/66   Wt Readings from Last 3 Encounters:  09/08/23 153 lb 9.6 oz (69.7 kg)  08/08/23 150 lb 9.6 oz (68.3 kg)  07/08/23 151 lb 6.4 oz (68.7 kg)      Physical Exam Constitutional:      General: She is not in acute distress.    Appearance: Normal appearance.     Comments: Hard of hearing  HENT:     Head: Normocephalic and atraumatic.     Nose: Nose normal.     Mouth/Throat:     Mouth: Mucous membranes are moist.  Cardiovascular:     Rate and Rhythm: Normal rate and regular rhythm.     Heart sounds: Normal heart sounds.  Pulmonary:     Effort: Pulmonary effort is  normal.     Breath sounds: Normal breath sounds.  Skin:    General: Skin is warm and dry.     Findings: Bruising present.         Comments: An 8.5-9 cm skin tear on dorsum of left hand with approximation of edges.  No drainage, erythema, increased warmth.  Neurological:     Mental Status: She is alert and oriented to person, place, and time.  Psychiatric:        Behavior: Behavior is cooperative.        No results found for any visits on 09/08/23.    Assessment & Plan:   Skin tear of hand without complication, initial encounter  Need for Tdap vaccination   Dorsum of left hand occurring at or just over 18 hours ago.  Unable to reapproximate edges.  Given timing and thin skin with decreased stretch discussed risk associated with suture.  Wound to heal by secondary intention.  Discussed care clean and dry.  Area rebandaged in clinic.  Given strict precautions.  Upon review of chart it appears Tdap due.  Attempting to contact patient as she left prior to receiving vaccine.  Update: Patient received Tdap the next day 09/09/2023 in clinic.  Return if symptoms worsen or fail to improve.   Clotilda JONELLE Single, MD

## 2023-09-08 NOTE — Telephone Encounter (Signed)
 Copied from CRM #[id number]. Topic: Clinical - Red Word Triage >> Sep 08, 2023  9:35 AM Harlene ORN wrote: Red Word that prompted transfer to Nurse Triage: cut her hand yesterday on a cabinet from falling Reason for Disposition  [1] SEVERE pain (e.g., excruciating, unable to use hand at all) AND [2] not improved 2 hours after pain medicine/ice packs  Answer Assessment - Initial Assessment Questions 1. MECHANISM: How did the injury happen?       I scrapped the skin off the top of my hand.   It's turning black.  I need to have it checked.    2. ONSET: When did the injury happen? (e.g., minutes, hours ago)         I fell from a step stool while reaching for a dish.   The back of my hand hit the counter when I fell. 3. APPEARANCE of INJURY: What does the injury look like?      The skin is scrapped off and it's turning black.   I need it checked.  4. SEVERITY: Can you use your hand normally? Can you bend your fingers into a ball and then fully open them?     I have wrapped with gauze.    5. SIZE: For cuts, bruises, or swelling, ask: How large is it? (e.g., inches or centimeters; entire hand)      Skin scrapped of back of hand. 6. PAIN: How bad is the pain? (Scale 0-10; or none, mild, moderate, severe)     No pain 7. TETANUS: For any breaks in the skin, ask: When was your last tetanus booster?     7 yrs ago 8. OTHER SYMPTOMS: Do you have any other symptoms?      No 9. PREGNANCY: Is there any chance you are pregnant? When was your last menstrual period?     N/A due to age  Protocols used: Hand Injury-A-AH FYI Only or Action Required?: FYI only for provider.  Patient was last seen in primary care on 08/08/2023 by Johnny Garnette LABOR, MD.  Called Nurse Triage reporting Hand Injury. Fell yesterday and hit the top of her hand on the counter scrapping the skin off the top of it.  Symptoms began yesterday.  Interventions attempted: Other: wrapped in gauze with Neosporin on  it.  Symptoms are: unchanged. Skin scrapped off during a fall and wants it checked.  Triage Disposition: See HCP Within 4 Hours (Or PCP Triage)  Patient/caregiver understands and will follow disposition?: Yes

## 2023-09-09 ENCOUNTER — Ambulatory Visit (INDEPENDENT_AMBULATORY_CARE_PROVIDER_SITE_OTHER)

## 2023-09-09 DIAGNOSIS — Z23 Encounter for immunization: Secondary | ICD-10-CM | POA: Diagnosis not present

## 2023-09-12 ENCOUNTER — Other Ambulatory Visit: Payer: Self-pay | Admitting: Family Medicine

## 2023-09-15 DIAGNOSIS — M1712 Unilateral primary osteoarthritis, left knee: Secondary | ICD-10-CM | POA: Diagnosis not present

## 2023-09-16 ENCOUNTER — Other Ambulatory Visit: Payer: Self-pay | Admitting: Family Medicine

## 2023-09-17 ENCOUNTER — Other Ambulatory Visit: Payer: Self-pay

## 2023-09-17 ENCOUNTER — Emergency Department (HOSPITAL_COMMUNITY)

## 2023-09-17 ENCOUNTER — Encounter (HOSPITAL_COMMUNITY): Payer: Self-pay | Admitting: Emergency Medicine

## 2023-09-17 ENCOUNTER — Inpatient Hospital Stay (HOSPITAL_COMMUNITY)
Admission: EM | Admit: 2023-09-17 | Discharge: 2023-09-21 | DRG: 552 | Disposition: A | Discharge status: Skilled Nursing Facility | Attending: Internal Medicine | Admitting: Internal Medicine

## 2023-09-17 DIAGNOSIS — N183 Chronic kidney disease, stage 3 unspecified: Secondary | ICD-10-CM | POA: Diagnosis not present

## 2023-09-17 DIAGNOSIS — Z888 Allergy status to other drugs, medicaments and biological substances status: Secondary | ICD-10-CM

## 2023-09-17 DIAGNOSIS — S199XXA Unspecified injury of neck, initial encounter: Secondary | ICD-10-CM | POA: Diagnosis not present

## 2023-09-17 DIAGNOSIS — N179 Acute kidney failure, unspecified: Secondary | ICD-10-CM | POA: Diagnosis not present

## 2023-09-17 DIAGNOSIS — Z79899 Other long term (current) drug therapy: Secondary | ICD-10-CM | POA: Diagnosis not present

## 2023-09-17 DIAGNOSIS — E1122 Type 2 diabetes mellitus with diabetic chronic kidney disease: Secondary | ICD-10-CM | POA: Diagnosis not present

## 2023-09-17 DIAGNOSIS — I499 Cardiac arrhythmia, unspecified: Secondary | ICD-10-CM | POA: Diagnosis not present

## 2023-09-17 DIAGNOSIS — D631 Anemia in chronic kidney disease: Secondary | ICD-10-CM | POA: Diagnosis not present

## 2023-09-17 DIAGNOSIS — Z87891 Personal history of nicotine dependence: Secondary | ICD-10-CM | POA: Diagnosis not present

## 2023-09-17 DIAGNOSIS — W19XXXA Unspecified fall, initial encounter: Principal | ICD-10-CM | POA: Diagnosis present

## 2023-09-17 DIAGNOSIS — Z881 Allergy status to other antibiotic agents status: Secondary | ICD-10-CM

## 2023-09-17 DIAGNOSIS — Z833 Family history of diabetes mellitus: Secondary | ICD-10-CM | POA: Diagnosis not present

## 2023-09-17 DIAGNOSIS — E782 Mixed hyperlipidemia: Secondary | ICD-10-CM | POA: Diagnosis not present

## 2023-09-17 DIAGNOSIS — I129 Hypertensive chronic kidney disease with stage 1 through stage 4 chronic kidney disease, or unspecified chronic kidney disease: Secondary | ICD-10-CM | POA: Diagnosis not present

## 2023-09-17 DIAGNOSIS — E860 Dehydration: Secondary | ICD-10-CM | POA: Diagnosis present

## 2023-09-17 DIAGNOSIS — E039 Hypothyroidism, unspecified: Secondary | ICD-10-CM | POA: Diagnosis present

## 2023-09-17 DIAGNOSIS — S32018A Other fracture of first lumbar vertebra, initial encounter for closed fracture: Principal | ICD-10-CM | POA: Diagnosis present

## 2023-09-17 DIAGNOSIS — S32019A Unspecified fracture of first lumbar vertebra, initial encounter for closed fracture: Secondary | ICD-10-CM | POA: Diagnosis present

## 2023-09-17 DIAGNOSIS — M1712 Unilateral primary osteoarthritis, left knee: Secondary | ICD-10-CM | POA: Diagnosis present

## 2023-09-17 DIAGNOSIS — K5900 Constipation, unspecified: Secondary | ICD-10-CM | POA: Diagnosis not present

## 2023-09-17 DIAGNOSIS — Z9109 Other allergy status, other than to drugs and biological substances: Secondary | ICD-10-CM

## 2023-09-17 DIAGNOSIS — I878 Other specified disorders of veins: Secondary | ICD-10-CM | POA: Diagnosis not present

## 2023-09-17 DIAGNOSIS — R55 Syncope and collapse: Secondary | ICD-10-CM | POA: Diagnosis not present

## 2023-09-17 DIAGNOSIS — I672 Cerebral atherosclerosis: Secondary | ICD-10-CM | POA: Diagnosis not present

## 2023-09-17 DIAGNOSIS — Y92009 Unspecified place in unspecified non-institutional (private) residence as the place of occurrence of the external cause: Secondary | ICD-10-CM

## 2023-09-17 DIAGNOSIS — M25552 Pain in left hip: Secondary | ICD-10-CM | POA: Diagnosis not present

## 2023-09-17 DIAGNOSIS — R404 Transient alteration of awareness: Secondary | ICD-10-CM | POA: Diagnosis not present

## 2023-09-17 DIAGNOSIS — Z882 Allergy status to sulfonamides status: Secondary | ICD-10-CM

## 2023-09-17 DIAGNOSIS — E559 Vitamin D deficiency, unspecified: Secondary | ICD-10-CM | POA: Diagnosis present

## 2023-09-17 DIAGNOSIS — N189 Chronic kidney disease, unspecified: Secondary | ICD-10-CM | POA: Diagnosis present

## 2023-09-17 DIAGNOSIS — E1165 Type 2 diabetes mellitus with hyperglycemia: Secondary | ICD-10-CM | POA: Diagnosis present

## 2023-09-17 DIAGNOSIS — K5730 Diverticulosis of large intestine without perforation or abscess without bleeding: Secondary | ICD-10-CM | POA: Diagnosis not present

## 2023-09-17 DIAGNOSIS — I10 Essential (primary) hypertension: Secondary | ICD-10-CM | POA: Diagnosis present

## 2023-09-17 DIAGNOSIS — Z79891 Long term (current) use of opiate analgesic: Secondary | ICD-10-CM

## 2023-09-17 DIAGNOSIS — Z8249 Family history of ischemic heart disease and other diseases of the circulatory system: Secondary | ICD-10-CM | POA: Diagnosis not present

## 2023-09-17 DIAGNOSIS — M5030 Other cervical disc degeneration, unspecified cervical region: Secondary | ICD-10-CM | POA: Diagnosis not present

## 2023-09-17 DIAGNOSIS — M5116 Intervertebral disc disorders with radiculopathy, lumbar region: Secondary | ICD-10-CM | POA: Diagnosis not present

## 2023-09-17 DIAGNOSIS — M25551 Pain in right hip: Secondary | ICD-10-CM | POA: Diagnosis not present

## 2023-09-17 DIAGNOSIS — I6782 Cerebral ischemia: Secondary | ICD-10-CM | POA: Diagnosis not present

## 2023-09-17 DIAGNOSIS — Z79890 Hormone replacement therapy (postmenopausal): Secondary | ICD-10-CM

## 2023-09-17 DIAGNOSIS — I700 Atherosclerosis of aorta: Secondary | ICD-10-CM | POA: Diagnosis not present

## 2023-09-17 DIAGNOSIS — N1832 Chronic kidney disease, stage 3b: Secondary | ICD-10-CM | POA: Diagnosis not present

## 2023-09-17 DIAGNOSIS — M160 Bilateral primary osteoarthritis of hip: Secondary | ICD-10-CM | POA: Diagnosis not present

## 2023-09-17 DIAGNOSIS — S0990XA Unspecified injury of head, initial encounter: Secondary | ICD-10-CM | POA: Diagnosis not present

## 2023-09-17 LAB — CBC WITH DIFFERENTIAL/PLATELET
Abs Immature Granulocytes: 0.25 K/uL — ABNORMAL HIGH (ref 0.00–0.07)
Basophils Absolute: 0.1 K/uL (ref 0.0–0.1)
Basophils Relative: 0 %
Eosinophils Absolute: 0.1 K/uL (ref 0.0–0.5)
Eosinophils Relative: 0 %
HCT: 34.3 % — ABNORMAL LOW (ref 36.0–46.0)
Hemoglobin: 10.9 g/dL — ABNORMAL LOW (ref 12.0–15.0)
Immature Granulocytes: 1 %
Lymphocytes Relative: 5 %
Lymphs Abs: 0.9 K/uL (ref 0.7–4.0)
MCH: 28.4 pg (ref 26.0–34.0)
MCHC: 31.8 g/dL (ref 30.0–36.0)
MCV: 89.3 fL (ref 80.0–100.0)
Monocytes Absolute: 1.3 K/uL — ABNORMAL HIGH (ref 0.1–1.0)
Monocytes Relative: 7 %
Neutro Abs: 17.2 K/uL — ABNORMAL HIGH (ref 1.7–7.7)
Neutrophils Relative %: 87 %
Platelets: 312 K/uL (ref 150–400)
RBC: 3.84 MIL/uL — ABNORMAL LOW (ref 3.87–5.11)
RDW: 14 % (ref 11.5–15.5)
WBC: 19.8 K/uL — ABNORMAL HIGH (ref 4.0–10.5)
nRBC: 0 % (ref 0.0–0.2)

## 2023-09-17 LAB — COMPREHENSIVE METABOLIC PANEL WITH GFR
ALT: 20 U/L (ref 0–44)
AST: 26 U/L (ref 15–41)
Albumin: 4.1 g/dL (ref 3.5–5.0)
Alkaline Phosphatase: 135 U/L — ABNORMAL HIGH (ref 38–126)
Anion gap: 14 (ref 5–15)
BUN: 39 mg/dL — ABNORMAL HIGH (ref 8–23)
CO2: 22 mmol/L (ref 22–32)
Calcium: 10 mg/dL (ref 8.9–10.3)
Chloride: 97 mmol/L — ABNORMAL LOW (ref 98–111)
Creatinine, Ser: 2.15 mg/dL — ABNORMAL HIGH (ref 0.44–1.00)
GFR, Estimated: 22 mL/min — ABNORMAL LOW
Glucose, Bld: 122 mg/dL — ABNORMAL HIGH (ref 70–99)
Potassium: 4 mmol/L (ref 3.5–5.1)
Sodium: 133 mmol/L — ABNORMAL LOW (ref 135–145)
Total Bilirubin: 0.3 mg/dL (ref 0.0–1.2)
Total Protein: 6.5 g/dL (ref 6.5–8.1)

## 2023-09-17 LAB — URINALYSIS, ROUTINE W REFLEX MICROSCOPIC
Bilirubin Urine: NEGATIVE
Glucose, UA: NEGATIVE mg/dL
Hgb urine dipstick: NEGATIVE
Ketones, ur: NEGATIVE mg/dL
Leukocytes,Ua: NEGATIVE
Nitrite: NEGATIVE
Protein, ur: NEGATIVE mg/dL
Specific Gravity, Urine: 1.008 (ref 1.005–1.030)
pH: 5 (ref 5.0–8.0)

## 2023-09-17 LAB — CREATININE, URINE, RANDOM: Creatinine, Urine: 48 mg/dL

## 2023-09-17 LAB — TROPONIN T, HIGH SENSITIVITY: Troponin T High Sensitivity: 26 ng/L — ABNORMAL HIGH (ref 0–19)

## 2023-09-17 LAB — OSMOLALITY, URINE: Osmolality, Ur: 262 mosm/kg — ABNORMAL LOW (ref 300–900)

## 2023-09-17 LAB — SODIUM, URINE, RANDOM: Sodium, Ur: 53 mmol/L

## 2023-09-17 LAB — CBG MONITORING, ED: Glucose-Capillary: 110 mg/dL — ABNORMAL HIGH (ref 70–99)

## 2023-09-17 MED ORDER — ACETAMINOPHEN 325 MG PO TABS: 650.0000 mg | ORAL_TABLET | Freq: Four times a day (QID) | ORAL | Status: DC | PRN

## 2023-09-17 MED ORDER — ROSUVASTATIN CALCIUM 20 MG PO TABS
40.0000 mg | ORAL_TABLET | Freq: Every day | ORAL | Status: DC
  Administered 2023-09-18 – 2023-09-21 (×4): 40 mg via ORAL
  Filled 2023-09-17 (×5): qty 2

## 2023-09-17 MED ORDER — GABAPENTIN 300 MG PO CAPS
300.0000 mg | ORAL_CAPSULE | Freq: Three times a day (TID) | ORAL | Status: DC
  Administered 2023-09-18: 300 mg via ORAL
  Filled 2023-09-17: qty 1

## 2023-09-17 MED ORDER — ONDANSETRON HCL 4 MG/2ML IJ SOLN
4.0000 mg | Freq: Four times a day (QID) | INTRAMUSCULAR | Status: DC | PRN
  Administered 2023-09-18 (×2): 4 mg via INTRAVENOUS
  Filled 2023-09-17 (×2): qty 2

## 2023-09-17 MED ORDER — LINACLOTIDE 145 MCG PO CAPS
145.0000 ug | ORAL_CAPSULE | Freq: Every day | ORAL | Status: DC
  Administered 2023-09-18 – 2023-09-21 (×3): 145 ug via ORAL
  Filled 2023-09-17 (×4): qty 1

## 2023-09-17 MED ORDER — LACTATED RINGERS IV BOLUS
500.0000 mL | Freq: Once | INTRAVENOUS | Status: AC
  Administered 2023-09-17: 500 mL via INTRAVENOUS

## 2023-09-17 MED ORDER — HYDROMORPHONE HCL 1 MG/ML IJ SOLN
0.5000 mg | INTRAMUSCULAR | Status: DC | PRN
  Administered 2023-09-17: 0.5 mg via INTRAVENOUS
  Filled 2023-09-17 (×2): qty 1

## 2023-09-17 MED ORDER — SODIUM CHLORIDE 0.9 % IV SOLN: INTRAVENOUS | Status: AC

## 2023-09-17 MED ORDER — MORPHINE SULFATE (PF) 2 MG/ML IV SOLN
2.0000 mg | Freq: Once | INTRAVENOUS | Status: AC
  Administered 2023-09-17: 2 mg via INTRAVENOUS
  Filled 2023-09-17: qty 1

## 2023-09-17 MED ORDER — HYDROCODONE-ACETAMINOPHEN 5-325 MG PO TABS
1.0000 | ORAL_TABLET | ORAL | Status: DC | PRN
  Administered 2023-09-17 – 2023-09-21 (×8): 2 via ORAL
  Filled 2023-09-17 (×8): qty 2

## 2023-09-17 MED ORDER — LEVOTHYROXINE SODIUM 25 MCG PO TABS
25.0000 ug | ORAL_TABLET | Freq: Every day | ORAL | Status: DC
  Administered 2023-09-18 – 2023-09-21 (×4): 25 ug via ORAL
  Filled 2023-09-17 (×4): qty 1

## 2023-09-17 MED ORDER — HYDROMORPHONE HCL 1 MG/ML IJ SOLN
0.5000 mg | Freq: Once | INTRAMUSCULAR | Status: AC
  Administered 2023-09-17: 0.5 mg via INTRAVENOUS
  Filled 2023-09-17: qty 1

## 2023-09-17 MED ORDER — INSULIN ASPART 100 UNIT/ML IJ SOLN
0.0000 [IU] | INTRAMUSCULAR | Status: DC
  Administered 2023-09-19 – 2023-09-20 (×2): 1 [IU] via SUBCUTANEOUS
  Filled 2023-09-17: qty 0.09

## 2023-09-17 MED ORDER — ONDANSETRON HCL 4 MG PO TABS
4.0000 mg | ORAL_TABLET | Freq: Four times a day (QID) | ORAL | Status: DC | PRN
Start: 2023-09-17 — End: 2023-09-21
  Administered 2023-09-19: 4 mg via ORAL
  Filled 2023-09-17: qty 1

## 2023-09-17 MED ORDER — ACETAMINOPHEN 650 MG RE SUPP: 650.0000 mg | Freq: Four times a day (QID) | RECTAL | Status: DC | PRN

## 2023-09-17 NOTE — Assessment & Plan Note (Addendum)
 Appears to be in the setting of dehydration and AKI   Patient is unsure if she had any warning prior to  Check orthostatic vital signs Rehydrate follow fluid status  For completion cycle cardiac enzymes obtain serial EKG obtain echo

## 2023-09-17 NOTE — Assessment & Plan Note (Signed)
-  chronic avoid nephrotoxic medications such as NSAIDs, Vanco Zosyn combo,  avoid hypotension, continue to follow renal function

## 2023-09-17 NOTE — ED Provider Notes (Signed)
 [place] EMERGENCY DEPARTMENT AT [facility] Provider Note   CSN: [id number] Arrival date & time: 09/17/23  1440     Patient presents with: Fall, Back Pain, and Hip Pain   Madison Hernandez is a 83 y.o. female.   Fall  Back Pain Hip Pain  Patient is an 83 year old female to the ED today for concerns for syncopal episode earlier this afternoon, noted that she had  been getting up out of bed after laying down when she tried to around the corner, felt lightheaded and then reported to have fallen on her bottom, losing consciousness for a second or 2.  Notes that she was able to ambulate after the fall but has had immense sacral and low back pain since the injury that has been limiting her walking capacity.  Denies being on blood thinners.  Uncertain if she hit her head.  Was able to ambulate but with pain.  Reporting pain to both hips as well as to lumbar spine.  Additionally she is noted that she has had an episode of vomiting earlier today, noted that she had not taken her Linzess  which helps with constipation.  Noted that the vomiting episode happened while she was straining during bowel movement this AM.  Since then has not reported any nausea or abdominal pain.  Bowel movement was normal without hematochezia or melena.  Denies fever, headache, vision changes, vertigo, chest pain, cough, congestion, shortness of breath, dysuria, lower leg swelling.     Prior to Admission medications   Medication Sig Start Date End Date Taking? Authorizing Provider  amLODipine  (NORVASC ) 5 MG tablet Take 1 tablet (5 mg total) by mouth daily. 07/04/23  Yes Burchette, Wolm ORN, MD  benazepril  (LOTENSIN ) 20 MG tablet TAKE 1 TABLET BY MOUTH DAILY Patient taking differently: Take 10 mg by mouth daily. 01/25/23  Yes Burchette, Wolm ORN, MD  carboxymethylcellulose (REFRESH PLUS) 0.5 % SOLN 1 drop 3 (three) times daily as needed.   Yes [provider]  gabapentin  (NEURONTIN ) 300 MG capsule  TAKE 1 CAPSULE BY MOUTH 3 TIMES  DAILY Patient taking differently: Take 300 mg by mouth at bedtime. 09/16/23  Yes Burchette, Wolm ORN, MD  hydrochlorothiazide  (MICROZIDE ) 12.5 MG capsule TAKE 1 CAPSULE BY MOUTH DAILY 09/12/23  Yes Burchette, Wolm ORN, MD  levothyroxine  (SYNTHROID ) 25 MCG tablet Take 1 tablet (25 mcg total) by mouth daily before breakfast. 07/08/23  Yes Burchette, Wolm ORN, MD  linaclotide  (LINZESS ) 145 MCG CAPS capsule TAKE 1 CAPSULE BY MOUTH DAILY  BEFORE BREAKFAST 07/14/23  Yes Burchette, Wolm ORN, MD  meloxicam  (MOBIC ) 15 MG tablet Take once a day as needed for arthritis pain. Take with a meal 03/31/23  Yes [provider]  traMADol  (ULTRAM ) 50 MG tablet Take 50 mg by mouth every 4 (four) hours as needed.   Yes [provider]  doxycycline  (VIBRA -TABS) 100 MG tablet Take 1 tablet (100 mg total) by mouth 2 (two) times daily. Patient not taking: Reported on 09/17/2023 08/08/23   Johnny Garnette DELENA, MD  furosemide  (LASIX ) 20 MG tablet TAKE 1 TABLET BY MOUTH DAILY AS NEEDED Patient not taking: Reported on 09/17/2023 04/01/23   Micheal Wolm ORN, MD  ketoconazole  (NIZORAL ) 2 % cream Apply 1 Application topically daily. 04/06/22   Burchette, Wolm ORN, MD  metroNIDAZOLE  (METROCREAM ) 0.75 % cream Apply topically 2 (two) times daily. Patient not taking: Reported on 09/17/2023 03/29/23   Micheal Wolm ORN, MD  nystatin  (MYCOSTATIN ) 100000 UNIT/ML suspension Take 5 ml  and swish, gargle, and spit four times daily Patient not taking: Reported on 09/17/2023 12/17/20   Micheal Wolm ORN, MD  rosuvastatin  (CRESTOR ) 40 MG tablet TAKE 1 TABLET BY MOUTH DAILY Patient not taking: Reported on 09/17/2023 08/04/23   Micheal Wolm ORN, MD    Allergies: Avelox [moxifloxacin hcl in nacl], Mucinex [guaifenesin er], Other, Sulfa  antibiotics, Albuterol , and Cefdinir    Review of Systems  Musculoskeletal:  Positive for back pain.  Neurological:  Positive for syncope.  All other systems reviewed and are  negative.   Updated Vital Signs BP (!) 155/75 (BP Location: Left Arm)   Pulse 86   Temp 98.3 F (36.8 C) (Oral)   Resp 18   Ht 5' 4 (1.626 m)   Wt 66.8 kg   SpO2 100%   BMI 25.28 kg/m   Physical Exam Vitals and nursing note reviewed.  Constitutional:      General: She is not in acute distress.    Appearance: Normal appearance. She is not ill-appearing or diaphoretic.  HENT:     Head: Normocephalic and atraumatic.  Eyes:     General: No scleral icterus.       Right eye: No discharge.        Left eye: No discharge.     Extraocular Movements: Extraocular movements intact.     Conjunctiva/sclera: Conjunctivae normal.     Pupils: Pupils are equal, round, and reactive to light.  Cardiovascular:     Rate and Rhythm: Normal rate and regular rhythm.     Pulses: Normal pulses.     Heart sounds: Normal heart sounds. No murmur heard.    No friction rub. No gallop.  Pulmonary:     Effort: Pulmonary effort is normal. No respiratory distress.     Breath sounds: No stridor. No wheezing, rhonchi or rales.  Chest:     Chest wall: No tenderness.  Abdominal:     General: Abdomen is flat. There is no distension.     Palpations: Abdomen is soft.     Tenderness: There is no abdominal tenderness. There is no right CVA tenderness, left CVA tenderness, guarding or rebound.  Musculoskeletal:        General: Tenderness (Noted to have pain with palpation over sacrum and lumbar spine, midline.) present. No swelling, deformity or signs of injury.     Cervical back: Normal range of motion and neck supple. No rigidity or tenderness.     Right lower leg: No edema.     Left lower leg: No edema.     Comments: Positive straight leg test to left leg.  Negative the right.  Skin:    General: Skin is warm and dry.     Findings: No erythema or lesion.  Neurological:     General: No focal deficit present.     Mental Status: She is alert and oriented to person, place, and time. Mental status is at  baseline.     Cranial Nerves: No cranial nerve deficit.     Sensory: No sensory deficit.     Motor: No weakness.     Coordination: Coordination normal.     Comments: No facial asymmetry, no ataxia, no apraxia, no aphasia, no arm drift, normal coordination with finger-to-nose, normal sensation to both upper and lower extremities bilaterally, normal grip strength bilaterally, normal strength to both flexion and extension to both upper lower extremities 5+ bilaterally, no visual field deficits, no nystagmus.   Psychiatric:        Mood  and Affect: Mood normal.     (all labs ordered are listed, but only abnormal results are displayed) Labs Reviewed  CBC WITH DIFFERENTIAL/PLATELET - Abnormal; Notable for the following components:      Result Value   WBC 19.8 (*)    RBC 3.84 (*)    Hemoglobin 10.9 (*)    HCT 34.3 (*)    Neutro Abs 17.2 (*)    Monocytes Absolute 1.3 (*)    Abs Immature Granulocytes 0.25 (*)    All other components within normal limits  COMPREHENSIVE METABOLIC PANEL WITH GFR - Abnormal; Notable for the following components:   Sodium 133 (*)    Chloride 97 (*)    Glucose, Bld 122 (*)    BUN 39 (*)    Creatinine, Ser 2.15 (*)    Alkaline Phosphatase 135 (*)    GFR, Estimated 22 (*)    All other components within normal limits  OSMOLALITY, URINE - Abnormal; Notable for the following components:   Osmolality, Ur 262 (*)    All other components within normal limits  CBG MONITORING, ED - Abnormal; Notable for the following components:   Glucose-Capillary 110 (*)    All other components within normal limits  TROPONIN T, HIGH SENSITIVITY - Abnormal; Notable for the following components:   Troponin T High Sensitivity 26 (*)    All other components within normal limits  URINALYSIS, ROUTINE W REFLEX MICROSCOPIC  CREATININE, URINE, RANDOM  SODIUM, URINE, RANDOM  CK  MAGNESIUM  PHOSPHORUS  TSH  VITAMIN B12  FOLATE  IRON AND TIBC  FERRITIN  RETICULOCYTES  VITAMIN D  25  HYDROXY (VIT D DEFICIENCY, FRACTURES)  OSMOLALITY  HEMOGLOBIN A1C  BASIC METABOLIC PANEL WITH GFR  MAGNESIUM  PHOSPHORUS  COMPREHENSIVE METABOLIC PANEL WITH GFR  CBC  TROPONIN T, HIGH SENSITIVITY  TROPONIN T, HIGH SENSITIVITY    EKG: EKG Interpretation Date/Time:  Saturday September 17 2023 16:49:32 EDT Ventricular Rate:  86 PR Interval:  171 QRS Duration:  78 QT Interval:  370 QTC Calculation: 443 R Axis:   163  Text Interpretation: Right and left arm electrode reversal, interpretation assumes no reversal Sinus or ectopic atrial rhythm Probable lateral infarct, age indeterminate No old tracing to compare Confirmed by Lenor Hollering [phone]) on 09/17/2023 5:48:22 PM  Radiology: CT Lumbar Spine Wo Contrast Result Date: 09/17/2023 CLINICAL DATA:  Lumbar radiculopathy.  Trip and fall.  Back pain. EXAM: CT LUMBAR SPINE WITHOUT CONTRAST TECHNIQUE: Multidetector CT imaging of the lumbar spine was performed without intravenous contrast administration. Multiplanar CT image reconstructions were also generated. RADIATION DOSE REDUCTION: This exam was performed according to the departmental dose-optimization program which includes automated exposure control, adjustment of the mA and/or kV according to patient size and/or use of iterative reconstruction technique. COMPARISON:  None Available. FINDINGS: Segmentation: 5 lumbar type vertebrae. Alignment: Normal. Vertebrae: Comminuted fracture through the anterior middle columns of L1 vertebra. There is mild displacement of the superior endplate. 25% loss of height centrally. There is slight buckling of the posterior cortex. No extension into the lamina or pedicles. Prominent Schmorl's node involving superior endplate of T12 is chronic when compared to prior thoracic spine MRI. The bones are subjectively under mineralized. No additional acute fracture. The included sacrum is intact. Paraspinal and other soft tissues: Minimal L1 perispinal soft tissue thickening  at site of fracture. Aortic atherosclerosis. Colonic diverticulosis. Disc levels: Moderate degenerative disc disease at L2-L3 and L5-S1. Multilevel facet hypertrophy. IMPRESSION: 1. Comminuted fracture through the anterior and middle  columns of L1 vertebra with 25% loss of height centrally. Slight buckling of the posterior cortex. No extension into the lamina or pedicles. 2. Moderate degenerative disc disease at L2-L3 and L5-S1. Multilevel facet hypertrophy. Aortic Atherosclerosis (ICD10-I70.0). Electronically Signed   By: Andrea Gasman M.D.   On: 09/17/2023 17:27   CT Cervical Spine Wo Contrast Result Date: 09/17/2023 CLINICAL DATA:  Neck trauma (Age >= 65y) Trip and fall. EXAM: CT CERVICAL SPINE WITHOUT CONTRAST TECHNIQUE: Multidetector CT imaging of the cervical spine was performed without intravenous contrast. Multiplanar CT image reconstructions were also generated. RADIATION DOSE REDUCTION: This exam was performed according to the departmental dose-optimization program which includes automated exposure control, adjustment of the mA and/or kV according to patient size and/or use of iterative reconstruction technique. COMPARISON:  None Available. FINDINGS: Alignment: No traumatic subluxation. Trace anterolisthesis of C3 on C4 and C5 on C6 likely due to facet hypertrophy. Skull base and vertebrae: No acute fracture. Cervical vertebral body heights are maintained. Chronic mild T2 and T3 compression deformities, unchanged from 2018 thoracic spine MRI. The dens and skull base are intact. Soft tissues and spinal canal: No prevertebral fluid or swelling. No visible canal hematoma. Disc levels: Mild multilevel degenerative disc disease with moderate diffuse facet hypertrophy. Upper chest: No acute findings. Right thyroid  nodule was previously assessed on thyroid  ultrasound. Other: None. IMPRESSION: 1. No acute fracture or traumatic subluxation of the cervical spine. 2. Multilevel degenerative disc disease and  facet hypertrophy. Electronically Signed   By: Andrea Gasman M.D.   On: 09/17/2023 17:22   CT Head Wo Contrast Result Date: 09/17/2023 CLINICAL DATA:  Trip and fall. EXAM: CT HEAD WITHOUT CONTRAST TECHNIQUE: Contiguous axial images were obtained from the base of the skull through the vertex without intravenous contrast. RADIATION DOSE REDUCTION: This exam was performed according to the departmental dose-optimization program which includes automated exposure control, adjustment of the mA and/or kV according to patient size and/or use of iterative reconstruction technique. COMPARISON:  Head CT 08/05/2016 FINDINGS: Brain: No intracranial hemorrhage, mass effect, or midline shift. Brain volume is normal for age. No hydrocephalus. The basilar cisterns are patent. Periventricular and deep white matter hypodensity, nonspecific but typical of chronic small vessel ischemia. Mild progression from remote exam. No evidence of territorial infarct or acute ischemia. No extra-axial or intracranial fluid collection. Vascular: Atherosclerosis of skullbase vasculature without hyperdense vessel or abnormal calcification. Skull: No fracture or focal lesion. Sinuses/Orbits: No acute finding. Other: No confluent scalp hematoma. IMPRESSION: 1. No acute intracranial abnormality. No skull fracture. 2. Chronic small vessel ischemia. Electronically Signed   By: Andrea Gasman M.D.   On: 09/17/2023 17:17   DG Hip Unilat With Pelvis 2-3 Views Left Result Date: 09/17/2023 CLINICAL DATA:  Trip and fall with bilateral hip pain. EXAM: DG HIP (WITH OR WITHOUT PELVIS) 2-3V LEFT; DG HIP (WITH OR WITHOUT PELVIS) 2-3V RIGHT COMPARISON:  None Available. FINDINGS: No acute fracture of the pelvis or hips. No hip dislocation. There is bilateral hip joint space narrowing with acetabular spurring. Pubic rami are intact. Pubic symphysis and sacroiliac joints are congruent. No gross disruption of sacral ala. No erosive change. Pelvic phleboliths.  IMPRESSION: 1. No acute fracture of the pelvis or hips. 2. Mild bilateral hip osteoarthritis. Electronically Signed   By: Andrea Gasman M.D.   On: 09/17/2023 15:13   DG Hip Unilat  With Pelvis 2-3 Views Right Result Date: 09/17/2023 CLINICAL DATA:  Trip and fall with bilateral hip pain. EXAM: DG HIP (WITH  OR WITHOUT PELVIS) 2-3V LEFT; DG HIP (WITH OR WITHOUT PELVIS) 2-3V RIGHT COMPARISON:  None Available. FINDINGS: No acute fracture of the pelvis or hips. No hip dislocation. There is bilateral hip joint space narrowing with acetabular spurring. Pubic rami are intact. Pubic symphysis and sacroiliac joints are congruent. No gross disruption of sacral ala. No erosive change. Pelvic phleboliths. IMPRESSION: 1. No acute fracture of the pelvis or hips. 2. Mild bilateral hip osteoarthritis. Electronically Signed   By: Andrea Gasman M.D.   On: 09/17/2023 15:13     Procedures   Medications Ordered in the ED  insulin  aspart (novoLOG ) injection 0-9 Units ( Subcutaneous Not Given 09/17/23 2344)  rosuvastatin  (CRESTOR ) tablet 40 mg (has no administration in time range)  levothyroxine  (SYNTHROID ) tablet 25 mcg (has no administration in time range)  linaclotide  (LINZESS ) capsule 145 mcg (has no administration in time range)  gabapentin  (NEURONTIN ) capsule 300 mg (has no administration in time range)  HYDROmorphone  (DILAUDID ) injection 0.5-1 mg (0.5 mg Intravenous Given 09/17/23 2255)  0.9 %  sodium chloride  infusion ( Intravenous New Bag/Given 09/17/23 2327)  acetaminophen  (TYLENOL ) tablet 650 mg (has no administration in time range)    Or  acetaminophen  (TYLENOL ) suppository 650 mg (has no administration in time range)  HYDROcodone -acetaminophen  (NORCO/VICODIN) 5-325 MG per tablet 1-2 tablet (2 tablets Oral Given 09/17/23 2325)  ondansetron  (ZOFRAN ) tablet 4 mg (has no administration in time range)    Or  ondansetron  (ZOFRAN ) injection 4 mg (has no administration in time range)  morphine  (PF) 2 MG/ML  injection 2 mg (2 mg Intravenous Given 09/17/23 1625)  lactated ringers  bolus 500 mL (0 mLs Intravenous Stopped 09/17/23 1901)  morphine  (PF) 2 MG/ML injection 2 mg (2 mg Intravenous Given 09/17/23 1737)  HYDROmorphone  (DILAUDID ) injection 0.5 mg (0.5 mg Intravenous Given 09/17/23 1859)  lactated ringers  bolus 500 mL (0 mLs Intravenous Stopped 09/17/23 2256)    Clinical Course as of 09/18/23 0010  Sat Sep 17, 2023  1830 Spoke with Dr. Jennetta with neurosurgery who said that she would recommend Lumbral sacral corset.  Ensuring that she is wearing this when ambulating as well as in the shower but can take off when in bed.  Follow up with neurosurgery.  [CB]    Clinical Course User Index [CB] Beola Terrall RAMAN, PA-C    Medical Decision Making Amount and/or Complexity of Data Reviewed Labs: ordered. Radiology: ordered. ECG/medicine tests: ordered.  Risk Prescription drug management. Decision regarding hospitalization.   This patient is a 82 year old female who presents to the ED for concern of syncopal episode lasting 1 to 2 seconds after she had gotten up immediately out of bed, eating episode of vomiting earlier today and has not eaten or drinking much since then.  Notes that she is having lumbar and bilateral hip pain but otherwise no other complaints.  Has been able to ambulate since the incident.  On physical exam, patient is in no acute distress, afebrile, alert and orient x 4, speaking in full sentences, nontachypneic, nontachycardic.  No neuroexam, LCTAB, RRR, no murmur.  No lower leg edema.  Notably is tender midline across lumbar spine and sacrum.  With positive straight leg test left side.  Exam is otherwise unremarkable.  With patient's reported single episode, Will evaluate labs as well as obtain ECG and imaging of head and neck with unknown as to whether or not she injured her head or not with her loss of consciousness reported by patient.  Patient is initial ECG appeared reversed,  reordered ECG.  Laboratory notes noted leukocytosis of 19.8, likely secondary to the fracture and pain that she has been undergoing, with her noted already have an elevated white count previous.  CMP notes an AKI.  Troponins were initially ordered due to likely being orthostatic with patient noting episode of vomiting and has not eaten or drink anything today, noted to have had a single episode only lasting for 1 or 2 seconds after she arose from laying down.  However with patient's AKI, comminuted fracture at L1, believe that she would warrant an admission as well as PT inpatient as patient lives at home alone.  Spoke with Dr. Jennetta with neurosurgery who recommended LSO and follow-up with neurosurgery but otherwise no further recommendations.  Patient care transferred over to Dr. Silvester, ordered troponin per her request.    Differential diagnoses prior to evaluation: The emergent differential diagnosis includes, but is not limited to, fracture, ligamentous injury, neurovascular injury, dislocation, malalignment, intra-abdominal injury, CVA, electrolyte disturbance. This is not an exhaustive differential.   Past Medical History / Co-morbidities / Social History: Bleeding ulcer, diverticulitis, migraines, HLD, HTN, arthritis, arrhythmia, ischemic colitis, type 2 diabetes, CKD  Additional history: Chart reviewed. Pertinent results include:  Last seen by orthopedics on 09/15/2023 noted to have end-stage arthritis of left knee worse than right progressively worsening pain and dysfunction.  Provided cortisone injection and follow-up in 4 weeks to reassess.  Noted to have echo with an EF of 60-65% in 2018.  Lab Tests/Imaging studies: I personally interpreted labs/imaging and the pertinent results include:    X-ray hip unilateral with pelvis left shows osteoarthritis with no acute injury X-ray hip unilateral with pelvis of right shows osteoarthritis with no acute injury.  CBC did have elevated  leukocytosis of 19.8 as well as a baseline hemoglobin 10.9 CMP notes a mild hyponatremia, and elevated creatinine of 2.15, increase in previous, notable for AKI as well as decreased GFR 22 UA unremarkable CT head and cervical spine did not show any acute findings CT lumbar spine did show a comminuted fracture through all columns of the L1 with degenerative changes noted otherwise.   I agree with the radiologist interpretation.  Cardiac monitoring: EKG obtained and interpreted by myself and attending physician which shows: Leads are reversed, pending new ECG  EKG Interpretation Date/Time:  Saturday September 17 2023 16:49:32 EDT Ventricular Rate:  86 PR Interval:  171 QRS Duration:  78 QT Interval:  370 QTC Calculation: 443 R Axis:   163  Text Interpretation: Right and left arm electrode reversal, interpretation assumes no reversal Sinus or ectopic atrial rhythm Probable lateral infarct, age indeterminate No old tracing to compare Confirmed by Lenor Hollering [phone]) on 09/17/2023 5:48:22 PM          Medications: I ordered medication including hydromorphone , LR, morphine .  I have reviewed the patients home medicines and have made adjustments as needed.  Critical Interventions: None  Social Determinants of Health: Notably lives home alone  Disposition: After consideration of the diagnostic results and the patients response to treatment, I feel that the patient would benefit from admission, patient care transferred over to Dr. Silvester   Final diagnoses:  Fall, initial encounter  Closed fracture of first lumbar vertebra, unspecified fracture morphology, initial encounter [facility])  AKI (acute kidney injury) [facility])    ED Discharge Orders     None          Beola Terrall RAMAN, PA-C 09/18/23 0012    Lenor Hollering, MD 09/18/23 [phone]

## 2023-09-17 NOTE — Assessment & Plan Note (Signed)
 Continue with Crestor 40 mg daily

## 2023-09-17 NOTE — Subjective & Objective (Signed)
 Came from home with a fall that was mechanical, was in bed stood up fast felt light headed slid across her bed , She has not been eating very well today, had one episode of vomiting after staring to use the bathroom  She has been constipated eventually did  have a BM No CP SOB  No head injury She feels like she might have passed out for a second Ecg non acute  104/60 BP HR 80 97% on RA

## 2023-09-17 NOTE — ED Notes (Signed)
 I performed a new EKG and gave it to [facility]

## 2023-09-17 NOTE — Assessment & Plan Note (Signed)
-   Check TSH continue home medications Synthroid at po q day

## 2023-09-17 NOTE — H&P (Signed)
 Madison Hernandez:[id number] DOB: 1940-06-08 DOA: 09/17/2023     PCP: Micheal Wolm ORN, MD   Patient arrived to ER on 09/17/23 at 1440 Referred by Attending Lenor Hollering, MD   Patient coming from:    home Lives alone,      Chief Complaint:   Chief Complaint  Patient presents with   Fall   Back Pain   Hip Pain    HPI: Madison Hernandez is a 83 y.o. female with medical history significant of HLD, HTN, hypothyroidism, Dm2, anemia. CKD 3b  Presented with    fall,   syncope, back pain  Came from home with a fall that was mechanical, was in bed stood up fast felt light headed slid across her bed , She has not been eating very well today, had one episode of vomiting after staring to use the bathroom  She has been constipated eventually did  have a BM No CP SOB  No head injury She feels like she might have passed out for a second Ecg non acute  104/60 BP HR 80 97% on RA    Pt is very constipated, was feeling nauseous and vomited, she had a BM and shortly after that she had this episode. She is actually unsure was it a true syncope or a fall . All she knows she ended up on the floor.    Denies significant ETOH intake   Does not smoke      Regarding pertinent Chronic problems:      Hyperlipidemia - on statins   Crestor  Lipid Panel     Component Value Date/Time   CHOL 213 (H) 07/08/2023 1129   TRIG 131.0 07/08/2023 1129   HDL 63.40 07/08/2023 1129   CHOLHDL 3 07/08/2023 1129   VLDL 26.2 07/08/2023 1129   LDLCALC 124 (H) 07/08/2023 1129   LDLCALC 237 (H) 12/24/2019 1042   LDLDIRECT 321.0 09/17/2020 1044     HTN on Norvasc  benazepril  Lasix  hydrochlorothiazide        DM 2 -  Lab Results  Component Value Date   HGBA1C 6.6 (A) 05/25/2023   diet controlled   Hypothyroidism:   Lab Results  Component Value Date   TSH 1.00 05/20/2023   on synthroid       CKD stage IIIb baseline Cr 1.6 Estimated Creatinine Clearance: 19.7 mL/min (A) (by C-G formula based on SCr  of 2.15 mg/dL (H)).  Lab Results  Component Value Date   CREATININE 2.15 (H) 09/17/2023   CREATININE 1.61 (H) 05/20/2023   CREATININE 1.32 (H) 01/13/2023   Lab Results  Component Value Date   NA 133 (L) 09/17/2023   CL 97 (L) 09/17/2023   K 4.0 09/17/2023   CO2 22 09/17/2023   BUN 39 (H) 09/17/2023   CREATININE 2.15 (H) 09/17/2023   GFRNONAA 22 (L) 09/17/2023   CALCIUM  10.0 09/17/2023   ALBUMIN 4.1 09/17/2023   GLUCOSE 122 (H) 09/17/2023     Chronic anemia - baseline hg Hemoglobin & Hematocrit  Recent Labs    05/16/23 1110 05/20/23 0953 09/17/23 1557  HGB 10.5* 11.6* 10.9*   Iron/TIBC/Ferritin/ %Sat    Component Value Date/Time   IRON 53 05/25/2023 1103   TIBC 370 05/25/2023 1103   FERRITIN 89 05/25/2023 1103   IRONPCTSAT 14 (L) 05/25/2023 1103     While in ER: Clinical Course as of 09/17/23 2028  Sat Sep 17, 2023  1830 Spoke with Dr. Jennetta with neurosurgery who said that she would recommend Lumbral  sacral corset.  Ensuring that she is wearing this when ambulating as well as in the shower but can take off when in bed.  Follow up with neurosurgery.  [CB]    Clinical Course User Index [CB] Beola Terrall RAMAN, PA-C       Lab Orders         CBC with Differential         Comprehensive metabolic panel         Urinalysis, Routine w reflex microscopic -Urine, Clean Catch     Hip neg - no frx Pelvis - no frx   CT HEAD   NON acute  CT neck No acute fracture or traumatic subluxation of the cervical spine. 2. Multilevel degenerative disc disease and facet hypertrophy.  CT lumbar spine -  Comminuted fracture through the anterior and middle columns of L1 vertebra with 25% loss of height centrally. Slight buckling of the posterior cortex. No extension into the lamina or pedicles. 2. Moderate degenerative disc disease at L2-L3 and L5-S1. Multilevel facet hypertrophy.  Following Medications were ordered in ER: Medications  lactated ringers  bolus 500 mL (has no  administration in time range)  morphine  (PF) 2 MG/ML injection 2 mg (2 mg Intravenous Given 09/17/23 1625)  lactated ringers  bolus 500 mL (0 mLs Intravenous Stopped 09/17/23 1901)  morphine  (PF) 2 MG/ML injection 2 mg (2 mg Intravenous Given 09/17/23 1737)  HYDROmorphone  (DILAUDID ) injection 0.5 mg (0.5 mg Intravenous Given 09/17/23 1859)    _______________________________________________________ ER Provider Called:       NS Dr. Jennetta They Recommend admit to medicine    LSO brace wear when ambulating can follow up as an outpatient      ED Triage Vitals  Encounter Vitals Group     BP 09/17/23 1456 (!) 128/50     Girls Systolic BP Percentile --      Girls Diastolic BP Percentile --      Boys Systolic BP Percentile --      Boys Diastolic BP Percentile --      Pulse Rate 09/17/23 1456 84     Resp 09/17/23 1456 18     Temp 09/17/23 1456 98 F (36.7 C)     Temp Source 09/17/23 1456 Oral     SpO2 09/17/23 1456 99 %     Weight --      Height --      Head Circumference --      Peak Flow --      Pain Score 09/17/23 1450 10     Pain Loc --      Pain Education --      Exclude from Growth Chart --   UFJK(75)@     _________________________________________ Significant initial  Findings: Abnormal Labs Reviewed  CBC WITH DIFFERENTIAL/PLATELET - Abnormal; Notable for the following components:      Result Value   WBC 19.8 (*)    RBC 3.84 (*)    Hemoglobin 10.9 (*)    HCT 34.3 (*)    Neutro Abs 17.2 (*)    Monocytes Absolute 1.3 (*)    Abs Immature Granulocytes 0.25 (*)    All other components within normal limits  COMPREHENSIVE METABOLIC PANEL WITH GFR - Abnormal; Notable for the following components:   Sodium 133 (*)    Chloride 97 (*)    Glucose, Bld 122 (*)    BUN 39 (*)    Creatinine, Ser 2.15 (*)    Alkaline Phosphatase 135 (*)    GFR,  Estimated 22 (*)    All other components within normal limits        ECG: Ordered Personally reviewed and interpreted by me showing: HR  : 86 Rhythm:Right and left arm electrode reversal, interpretation assumes no reversal Sinus or ectopic atrial rhythm Probable lateral infarct, age indeterminate No old tracing to compare QTC 443     The recent clinical data is shown below. Vitals:   09/17/23 1456 09/17/23 1856  BP: (!) 128/50 (!) 144/72  Pulse: 84 86  Resp: 18 16  Temp: 98 F (36.7 C) 97.9 F (36.6 C)  TempSrc: Oral Oral  SpO2: 99% 97%    WBC     Component Value Date/Time   WBC 19.8 (H) 09/17/2023 1557   LYMPHSABS 0.9 09/17/2023 1557   MONOABS 1.3 (H) 09/17/2023 1557   EOSABS 0.1 09/17/2023 1557   BASOSABS 0.1 09/17/2023 1557          UA   ordered   Urine analysis:    Component Value Date/Time   COLORURINE YELLOW 05/25/2023 1113   APPEARANCEUR CLEAR 05/25/2023 1113   LABSPEC 1.013 05/25/2023 1113   PHURINE <5.0 (L) 05/25/2023 1113   GLUCOSEU NEGATIVE 05/25/2023 1113   HGBUR NEGATIVE 05/25/2023 1113   BILIRUBINUR neg 05/17/2022 1656   KETONESUR NEGATIVE 05/25/2023 1113   PROTEINUR NEGATIVE 05/25/2023 1113   UROBILINOGEN negative (A) 05/17/2022 1656   UROBILINOGEN 0.2 02/21/2011 0742   NITRITE neg 05/17/2022 1656   NITRITE NEGATIVE 10/23/2019 1045   LEUKOCYTESUR Negative 05/17/2022 1656   LEUKOCYTESUR NEGATIVE 10/23/2019 1045    Results for orders placed or performed in visit on 05/25/23  Urine Culture     Status: None   Collection Time: 05/25/23 11:13 AM  Result Value Ref Range Status   MICRO NUMBER: [id number]  Final   SPECIMEN QUALITY: Adequate  Final   Sample Source URINE  Final   STATUS: FINAL  Final   Result:   Final    Less than 10,000 CFU/mL of single Gram positive organism isolated. No further testing will be performed. If clinically indicated, recollection using a method to minimize contamination, with prompt transfer to Urine Culture Transport Tube, is recommended.    ABX started Antibiotics Given (last 72 hours)     None        No results found for the last 90  days.       __________________________________________________________ Recent Labs  Lab 09/17/23 1557  NA 133*  K 4.0  CO2 22  GLUCOSE 122*  BUN 39*  CREATININE 2.15*  CALCIUM  10.0    Cr      Up from baseline see below Lab Results  Component Value Date   CREATININE 2.15 (H) 09/17/2023   CREATININE 1.61 (H) 05/20/2023   CREATININE 1.32 (H) 01/13/2023    Recent Labs  Lab 09/17/23 1557  AST 26  ALT 20  ALKPHOS 135*  BILITOT 0.3  PROT 6.5  ALBUMIN 4.1   Lab Results  Component Value Date   CALCIUM  10.0 09/17/2023        Plt: Lab Results  Component Value Date   PLT 312 09/17/2023       Recent Labs  Lab 09/17/23 1557  WBC 19.8*  NEUTROABS 17.2*  HGB 10.9*  HCT 34.3*  MCV 89.3  PLT 312    HG/HCT   stable,       Component Value Date/Time   HGB 10.9 (L) 09/17/2023 1557   HCT 34.3 (L) 09/17/2023 1557   MCV 89.3  09/17/2023 1557      _______________________________________________ Hospitalist was called for admission for   Fall, vs syncope Closed fracture of first lumbar vertebra   AKI   The following Work up has been ordered so far:  Orders Placed This Encounter  Procedures   DG Hip Unilat With Pelvis 2-3 Views Left   DG Hip Unilat  With Pelvis 2-3 Views Right   CT Head Wo Contrast   CT Cervical Spine Wo Contrast   CT Lumbar Spine Wo Contrast   CBC with Differential   Comprehensive metabolic panel   Urinalysis, Routine w reflex microscopic -Urine, Clean Catch   NPO Except for Meds   Outside Vendor Brace   Consult to neurosurgery   Consult to hospitalist   EKG 12-Lead   EKG 12-Lead     OTHER Significant initial  Findings:  labs showing:     DM  labs:  HbA1C: Recent Labs    12/28/22 1207 03/29/23 1332 05/25/23 1050  HGBA1C 7.3* 6.4* 6.6*       CBG (last 3)  No results for input(s): GLUCAP in the last 72 hours.        Cultures:    Component Value Date/Time   SDES URINE, RANDOM 01/23/2010 0343   SPECREQUEST  IMMUNE:NORM UT SYMPT:NEG 01/23/2010 0343   CULT  01/23/2010 0343    Multiple bacterial morphotypes present, none predominant. Suggest appropriate recollection if clinically indicated.   REPTSTATUS 01/24/2010 FINAL 01/23/2010 0343     Radiological Exams on Admission: CT Lumbar Spine Wo Contrast Result Date: 09/17/2023 CLINICAL DATA:  Lumbar radiculopathy.  Trip and fall.  Back pain. EXAM: CT LUMBAR SPINE WITHOUT CONTRAST TECHNIQUE: Multidetector CT imaging of the lumbar spine was performed without intravenous contrast administration. Multiplanar CT image reconstructions were also generated. RADIATION DOSE REDUCTION: This exam was performed according to the departmental dose-optimization program which includes automated exposure control, adjustment of the mA and/or kV according to patient size and/or use of iterative reconstruction technique. COMPARISON:  None Available. FINDINGS: Segmentation: 5 lumbar type vertebrae. Alignment: Normal. Vertebrae: Comminuted fracture through the anterior middle columns of L1 vertebra. There is mild displacement of the superior endplate. 25% loss of height centrally. There is slight buckling of the posterior cortex. No extension into the lamina or pedicles. Prominent Schmorl's node involving superior endplate of T12 is chronic when compared to prior thoracic spine MRI. The bones are subjectively under mineralized. No additional acute fracture. The included sacrum is intact. Paraspinal and other soft tissues: Minimal L1 perispinal soft tissue thickening at site of fracture. Aortic atherosclerosis. Colonic diverticulosis. Disc levels: Moderate degenerative disc disease at L2-L3 and L5-S1. Multilevel facet hypertrophy. IMPRESSION: 1. Comminuted fracture through the anterior and middle columns of L1 vertebra with 25% loss of height centrally. Slight buckling of the posterior cortex. No extension into the lamina or pedicles. 2. Moderate degenerative disc disease at L2-L3 and L5-S1.  Multilevel facet hypertrophy. Aortic Atherosclerosis (ICD10-I70.0). Electronically Signed   By: Andrea Gasman M.D.   On: 09/17/2023 17:27   CT Cervical Spine Wo Contrast Result Date: 09/17/2023 CLINICAL DATA:  Neck trauma (Age >= 65y) Trip and fall. EXAM: CT CERVICAL SPINE WITHOUT CONTRAST TECHNIQUE: Multidetector CT imaging of the cervical spine was performed without intravenous contrast. Multiplanar CT image reconstructions were also generated. RADIATION DOSE REDUCTION: This exam was performed according to the departmental dose-optimization program which includes automated exposure control, adjustment of the mA and/or kV according to patient size and/or use of iterative reconstruction technique. COMPARISON:  None Available. FINDINGS: Alignment: No traumatic subluxation. Trace anterolisthesis of C3 on C4 and C5 on C6 likely due to facet hypertrophy. Skull base and vertebrae: No acute fracture. Cervical vertebral body heights are maintained. Chronic mild T2 and T3 compression deformities, unchanged from 2018 thoracic spine MRI. The dens and skull base are intact. Soft tissues and spinal canal: No prevertebral fluid or swelling. No visible canal hematoma. Disc levels: Mild multilevel degenerative disc disease with moderate diffuse facet hypertrophy. Upper chest: No acute findings. Right thyroid  nodule was previously assessed on thyroid  ultrasound. Other: None. IMPRESSION: 1. No acute fracture or traumatic subluxation of the cervical spine. 2. Multilevel degenerative disc disease and facet hypertrophy. Electronically Signed   By: Andrea Gasman M.D.   On: 09/17/2023 17:22   CT Head Wo Contrast Result Date: 09/17/2023 CLINICAL DATA:  Trip and fall. EXAM: CT HEAD WITHOUT CONTRAST TECHNIQUE: Contiguous axial images were obtained from the base of the skull through the vertex without intravenous contrast. RADIATION DOSE REDUCTION: This exam was performed according to the departmental dose-optimization program  which includes automated exposure control, adjustment of the mA and/or kV according to patient size and/or use of iterative reconstruction technique. COMPARISON:  Head CT 08/05/2016 FINDINGS: Brain: No intracranial hemorrhage, mass effect, or midline shift. Brain volume is normal for age. No hydrocephalus. The basilar cisterns are patent. Periventricular and deep white matter hypodensity, nonspecific but typical of chronic small vessel ischemia. Mild progression from remote exam. No evidence of territorial infarct or acute ischemia. No extra-axial or intracranial fluid collection. Vascular: Atherosclerosis of skullbase vasculature without hyperdense vessel or abnormal calcification. Skull: No fracture or focal lesion. Sinuses/Orbits: No acute finding. Other: No confluent scalp hematoma. IMPRESSION: 1. No acute intracranial abnormality. No skull fracture. 2. Chronic small vessel ischemia. Electronically Signed   By: Andrea Gasman M.D.   On: 09/17/2023 17:17   DG Hip Unilat With Pelvis 2-3 Views Left Result Date: 09/17/2023 CLINICAL DATA:  Trip and fall with bilateral hip pain. EXAM: DG HIP (WITH OR WITHOUT PELVIS) 2-3V LEFT; DG HIP (WITH OR WITHOUT PELVIS) 2-3V RIGHT COMPARISON:  None Available. FINDINGS: No acute fracture of the pelvis or hips. No hip dislocation. There is bilateral hip joint space narrowing with acetabular spurring. Pubic rami are intact. Pubic symphysis and sacroiliac joints are congruent. No gross disruption of sacral ala. No erosive change. Pelvic phleboliths. IMPRESSION: 1. No acute fracture of the pelvis or hips. 2. Mild bilateral hip osteoarthritis. Electronically Signed   By: Andrea Gasman M.D.   On: 09/17/2023 15:13   DG Hip Unilat  With Pelvis 2-3 Views Right Result Date: 09/17/2023 CLINICAL DATA:  Trip and fall with bilateral hip pain. EXAM: DG HIP (WITH OR WITHOUT PELVIS) 2-3V LEFT; DG HIP (WITH OR WITHOUT PELVIS) 2-3V RIGHT COMPARISON:  None Available. FINDINGS: No acute  fracture of the pelvis or hips. No hip dislocation. There is bilateral hip joint space narrowing with acetabular spurring. Pubic rami are intact. Pubic symphysis and sacroiliac joints are congruent. No gross disruption of sacral ala. No erosive change. Pelvic phleboliths. IMPRESSION: 1. No acute fracture of the pelvis or hips. 2. Mild bilateral hip osteoarthritis. Electronically Signed   By: Andrea Gasman M.D.   On: 09/17/2023 15:13   _______________________________________________________________________________________________________ Latest  Blood pressure (!) 144/72, pulse 86, temperature 97.9 F (36.6 C), temperature source Oral, resp. rate 16, SpO2 97%.   Vitals  labs and radiology finding personally reviewed  Review of Systems:    Pertinent positives include:  abdominal pain, nausea, vomiting  Constitutional:  No weight loss, night sweats, Fevers, chills, fatigue, weight loss  HEENT:  No headaches, Difficulty swallowing,Tooth/dental problems,Sore throat,  No sneezing, itching, ear ache, nasal congestion, post nasal drip,  Cardio-vascular:  No chest pain, Orthopnea, PND, anasarca, dizziness, palpitations.no Bilateral lower extremity swelling  GI:  No heartburn, indigestion, , diarrhea, change in bowel habits, loss of appetite, melena, blood in stool, hematemesis Resp:  no shortness of breath at rest. No dyspnea on exertion, No excess mucus, no productive cough, No non-productive cough, No coughing up of blood.No change in color of mucus.No wheezing. Skin:  no rash or lesions. No jaundice GU:  no dysuria, change in color of urine, no urgency or frequency. No straining to urinate.  No flank pain.  Musculoskeletal:  No joint pain or no joint swelling. No decreased range of motion. No back pain.  Psych:  No change in mood or affect. No depression or anxiety. No memory loss.  Neuro: no localizing neurological complaints, no tingling, no weakness, no double vision, no gait  abnormality, no slurred speech, no confusion  All systems reviewed and apart from HOPI all are negative _______________________________________________________________________________________________ Past Medical History:   Past Medical History:  Diagnosis Date   Acute ischemic colitis (HCC) 1/12   Arthritis    Bleeding ulcer    22 years ago   Blood in stool    Cardiac arrhythmia due to congenital heart disease    Depression    Diverticulitis    Dyslipidemia    Headache(784.0)    Hyperlipidemia    Hypertension    Migraine    UTI (urinary tract infection)       Past Surgical History:  Procedure Laterality Date   bleeding ulcers     22 years ago   vein procedures     right and left lower extremity vein procedures    Social History:  Ambulatory   independently      reports that she quit smoking about 40 years ago. Her smoking use included cigarettes. She started smoking about 50 years ago. She has a 20 pack-year smoking history. She has never used smokeless tobacco. She reports that she does not drink alcohol and does not use drugs.   Family History:   Family History  Problem Relation Age of Onset   Heart disease Mother        age 78   Diabetes Mother    Hypertension Mother    Hypertension Sister    ______________________________________________________________________________________________ Allergies: Allergies  Allergen Reactions   Avelox [Moxifloxacin Hcl In Nacl] Shortness Of Breath   Mucinex [Guaifenesin Er] Other (See Comments)    Makes my heart flutter   Other     Other reaction(s): Unknown   Sulfa  Antibiotics Hives   Albuterol  Swelling and Palpitations   Cefdinir Rash     Prior to Admission medications   Medication Sig Start Date End Date Taking? Authorizing Provider  amLODipine  (NORVASC ) 5 MG tablet Take 1 tablet (5 mg total) by mouth daily. 07/04/23   Burchette, Wolm ORN, MD  benazepril  (LOTENSIN ) 20 MG tablet TAKE 1 TABLET BY MOUTH DAILY  01/25/23   Burchette, Wolm ORN, MD  doxycycline  (VIBRA -TABS) 100 MG tablet Take 1 tablet (100 mg total) by mouth 2 (two) times daily. 08/08/23   Johnny Garnette LABOR, MD  furosemide  (LASIX ) 20 MG tablet TAKE 1 TABLET BY MOUTH DAILY AS NEEDED 04/01/23   Burchette, Wolm ORN, MD  gabapentin  (NEURONTIN ) 300 MG capsule TAKE 1  CAPSULE BY MOUTH 3 TIMES  DAILY 09/16/23   Burchette, Wolm ORN, MD  hydrochlorothiazide  (MICROZIDE ) 12.5 MG capsule TAKE 1 CAPSULE BY MOUTH DAILY 09/12/23   Burchette, Wolm ORN, MD  ketoconazole  (NIZORAL ) 2 % cream Apply 1 Application topically daily. 04/06/22   Burchette, Wolm ORN, MD  levothyroxine  (SYNTHROID ) 25 MCG tablet Take 1 tablet (25 mcg total) by mouth daily before breakfast. 07/08/23   Burchette, Wolm ORN, MD  linaclotide  (LINZESS ) 145 MCG CAPS capsule TAKE 1 CAPSULE BY MOUTH DAILY  BEFORE BREAKFAST 07/14/23   Burchette, Wolm ORN, MD  metroNIDAZOLE  (METROCREAM ) 0.75 % cream Apply topically 2 (two) times daily. 03/29/23   Burchette, Wolm ORN, MD  Naproxen  Sodium (ALEVE ) 220 MG CAPS as needed.    [provider]  nystatin  (MYCOSTATIN ) 100000 UNIT/ML suspension Take 5 ml and swish, gargle, and spit four times daily 12/17/20   Burchette, Wolm ORN, MD  RESTASIS 0.05 % ophthalmic emulsion as directed. 12/10/19   [provider]  rosuvastatin  (CRESTOR ) 40 MG tablet TAKE 1 TABLET BY MOUTH DAILY 08/04/23   Micheal Wolm ORN, MD    ___________________________________________________________________________________________________ Physical Exam:    09/17/2023    6:56 PM 09/17/2023    2:56 PM 09/08/2023   11:26 AM  Vitals with BMI  Height   5' 4  Weight   153 lbs 10 oz  BMI   26.35  Systolic 144 128 867  Diastolic 72 50 74  Pulse 86 84 91     1. General:  in No  Acute distress   Chronically ill   -appearing 2. Psychological: Alert and   Oriented 3. Head/ENT:   Dry Mucous Membranes                          Head Non traumatic, neck supple                 Poor Dentition 4.  SKIN: decreased Skin turgor,  Skin clean Dry and intact no rash    5. Heart: Regular rate and rhythm no  Murmur, no Rub or gallop 6. Lungs:  no wheezes or crackles   7. Abdomen: Soft,  non-tender, Non distended bowel sounds present 8. Lower extremities: no clubbing, cyanosis, no  edema 9. Neurologically Grossly intact, moving all 4 extremities equally   10. MSK: Normal range of motion    Chart has been reviewed  ______________________________________________________________________________________________  Assessment/Plan  83 y.o. female with medical history significant of HLD, HTN, hypothyroidism, Dm2, anemia. CKD 3b  Admitted for   Fall, vs Syncope , Closed fracture of first lumbar vertebra,  AKI     Present on Admission:  L1 vertebral fracture (HCC)  Chronic kidney disease  HTN (hypertension)  Hypothyroid  Mixed hyperlipidemia  Type 2 diabetes mellitus with hyperglycemia (HCC)  Syncope  AKI (acute kidney injury) (HCC)    Chronic kidney disease  -chronic avoid nephrotoxic medications such as NSAIDs, Vanco Zosyn combo,  avoid hypotension, continue to follow renal function   HTN (hypertension) Hold benazepril  given AKI Allow permissive hypertension for today given orthostatic syncope  Hypothyroid - Check TSH continue home medications Synthroid  at 25mcg po q day   L1 vertebral fracture (HCC) Continue with brace pain control will need to follow-up as an outpatient.  Neurosurgery  Mixed hyperlipidemia Continue with Crestor  40 mg daily  Type 2 diabetes mellitus with hyperglycemia (HCC)  - Order Sensitive SSI     Syncope Appears to be in the  setting of dehydration and AKI   Patient is unsure if she had any warning prior to  Check orthostatic vital signs Rehydrate follow fluid status  For completion cycle cardiac enzymes obtain serial EKG obtain echo  AKI (acute kidney injury) (HCC) In the setting of dehydration will rehydrate and recheck    Other plan as per  orders.  DVT prophylaxis:  SCD     Code Status:    Code Status: Not on file FULL CODE   as per patient   I had personally discussed CODE STATUS with patient   ACP   none    Family Communication:   Family not at  Bedside    Diet  heart healthy   Disposition Plan:     likely will need placement for rehabilitation                        Following barriers for discharge:                              Syncope  work up is complete                            Electrolytes corrected                               Anemia stable                             Pain controlled with PO medications                                                                Consult Orders  (From admission, onward)           Start     Ordered   09/17/23 2005  Consult to hospitalist  Once       Provider:  (Not yet assigned)  Question Answer Comment  Place call to: [facility]   Reason for Consult Admit      09/17/23 2004                               Would benefit from PT/OT eval prior to DC  Ordered                                     Consults called:    NONE   Admission status:  ED Disposition     ED Disposition  Admit   Condition  --   Comment  Hospital Area: [facility] [facility] [[id number]]  Level of Care: Telemetry [5]  Admit to tele based on following criteria: Eval of Syncope  May place patient in observation at [facility] or [facility] if equivalent level of care is available:: No  Covid Evaluation: Asymptomatic - no recent exposure (last 10 days) testing not required  Diagnosis: L1 vertebral fracture [facility]) [[id number]]  Admitting Physician: Isaul Landi [[id number]]  Attending  Physician: Jasmyn Picha [[id number]]  For patients discharging to extended facilities (i.e. SNF, AL, group homes or LTAC) initiate:: Discharge to SNF/Facility Placement COVID-19 Lab Testing Protocol          Obs    Level of care     tele  For 12H      Alysa Duca 09/17/2023,  9:36 PM    [facility]     after 2 AM please page floor coverage   If 7AM-7PM, please contact the day team taking care of the patient using [email or web address]

## 2023-09-17 NOTE — Assessment & Plan Note (Signed)
 Hold benazepril  given AKI Allow permissive hypertension for today given orthostatic syncope

## 2023-09-17 NOTE — ED Triage Notes (Signed)
 Patient presents from home due to a mechanical fall in which she tripped and fell around her bed. She does not believe she hit her head, but can not tell details about what happened. EMS believes she fell on her buttock. She now complains of bilateral hip and back pain. She is not on blood thinners.    EMS vitals:  104/60 BP 80 HR 97 % SPO2 on room air 169 CBG

## 2023-09-17 NOTE — ED Notes (Signed)
 ED TO INPATIENT HANDOFF REPORT  ED Nurse Name and Phone #: Eric  [phone]  S Name/Age/Gender Madison Hernandez 83 y.o. female Room/Bed: WA16/WA16  Code Status   Code Status: Full Code  Home/SNF/Other Home Patient oriented to: self, place, time, and situation Is this baseline? Yes   Triage Complete: Triage complete  Chief Complaint L1 vertebral fracture [facility]) [S32.019A]  Triage Note Patient presents from home due to a mechanical fall in which she tripped and fell around her bed. She does not believe she hit her head, but can not tell details about what happened. EMS believes she fell on her buttock. She now complains of bilateral hip and back pain. She is not on blood thinners.    EMS vitals:  104/60 BP 80 HR 97 % SPO2 on room air 169 CBG    Allergies Allergies  Allergen Reactions   Avelox [Moxifloxacin Hcl In Nacl] Shortness Of Breath   Mucinex [Guaifenesin Er] Other (See Comments)    Makes my heart flutter   Other     Other reaction(s): Unknown   Sulfa  Antibiotics Hives   Albuterol  Swelling and Palpitations   Cefdinir Rash    Level of Care/Admitting Diagnosis ED Disposition     ED Disposition  Admit   Condition  --   Comment  Hospital Area: [facility] [[id number]]  Level of Care: Telemetry [5]  Admit to tele based on following criteria: Eval of Syncope  May place patient in observation at [facility] or [facility] if equivalent level of care is available:: No  Covid Evaluation: Asymptomatic - no recent exposure (last 10 days) testing not required  Diagnosis: L1 vertebral fracture [facility]) [[id number]]  Admitting Physician: DOUTOVA, ANASTASSIA [[id number]]  Attending Physician: DOUTOVA, ANASTASSIA [[id number]]  For patients discharging to extended facilities (i.e. SNF, AL, group homes or LTAC) initiate:: Discharge to SNF/Facility Placement COVID-19 Lab Testing Protocol          B Medical/Surgery History Past Medical History:  Diagnosis Date    Acute ischemic colitis (HCC) 1/12   Arthritis    Bleeding ulcer    22 years ago   Blood in stool    Cardiac arrhythmia due to congenital heart disease    Depression    Diverticulitis    Dyslipidemia    Headache(784.0)    Hyperlipidemia    Hypertension    Migraine    UTI (urinary tract infection)    Past Surgical History:  Procedure Laterality Date   bleeding ulcers     22 years ago   vein procedures     right and left lower extremity vein procedures     A IV Location/Drains/Wounds Patient Lines/Drains/Airways Status     Active Line/Drains/Airways     Name Placement date Placement time Site Days   Peripheral IV 09/17/23 22 G Left Antecubital 09/17/23  1550  Antecubital  less than 1            Intake/Output Last 24 hours No intake or output data in the 24 hours ending 09/17/23 2233  Labs/Imaging Results for orders placed or performed during the hospital encounter of 09/17/23 (from the past 48 hours)  CBC with Differential     Status: Abnormal   Collection Time: 09/17/23  3:57 PM  Result Value Ref Range   WBC 19.8 (H) 4.0 - 10.5 K/uL   RBC 3.84 (L) 3.87 - 5.11 MIL/uL   Hemoglobin 10.9 (L) 12.0 - 15.0 g/dL   HCT 65.6 (L) 63.9 - 53.9 %  MCV 89.3 80.0 - 100.0 fL   MCH 28.4 26.0 - 34.0 pg   MCHC 31.8 30.0 - 36.0 g/dL   RDW 85.9 88.4 - 84.4 %   Platelets 312 150 - 400 K/uL   nRBC 0.0 0.0 - 0.2 %   Neutrophils Relative % 87 %   Neutro Abs 17.2 (H) 1.7 - 7.7 K/uL   Lymphocytes Relative 5 %   Lymphs Abs 0.9 0.7 - 4.0 K/uL   Monocytes Relative 7 %   Monocytes Absolute 1.3 (H) 0.1 - 1.0 K/uL   Eosinophils Relative 0 %   Eosinophils Absolute 0.1 0.0 - 0.5 K/uL   Basophils Relative 0 %   Basophils Absolute 0.1 0.0 - 0.1 K/uL   Immature Granulocytes 1 %   Abs Immature Granulocytes 0.25 (H) 0.00 - 0.07 K/uL    Comment: Performed at [facility], [address] [place], [place] [address]  Comprehensive metabolic panel     Status: Abnormal    Collection Time: 09/17/23  3:57 PM  Result Value Ref Range   Sodium 133 (L) 135 - 145 mmol/L   Potassium 4.0 3.5 - 5.1 mmol/L   Chloride 97 (L) 98 - 111 mmol/L   CO2 22 22 - 32 mmol/L   Glucose, Bld 122 (H) 70 - 99 mg/dL    Comment: Glucose reference range applies only to samples taken after fasting for at least 8 hours.   BUN 39 (H) 8 - 23 mg/dL   Creatinine, Ser 7.84 (H) 0.44 - 1.00 mg/dL   Calcium  10.0 8.9 - 10.3 mg/dL   Total Protein 6.5 6.5 - 8.1 g/dL   Albumin 4.1 3.5 - 5.0 g/dL   AST 26 15 - 41 U/L   ALT 20 0 - 44 U/L   Alkaline Phosphatase 135 (H) 38 - 126 U/L   Total Bilirubin 0.3 0.0 - 1.2 mg/dL   GFR, Estimated 22 (L) >60 mL/min    Comment: (NOTE) Calculated using the CKD-EPI Creatinine Equation (2021)    Anion gap 14 5 - 15    Comment: Performed at [facility], [address] [place], [place] [address]  Urinalysis, Routine w reflex microscopic -Urine, Clean Catch     Status: None   Collection Time: 09/17/23 10:01 PM  Result Value Ref Range   Color, Urine YELLOW YELLOW   APPearance CLEAR CLEAR   Specific Gravity, Urine 1.008 1.005 - 1.030   pH 5.0 5.0 - 8.0   Glucose, UA NEGATIVE NEGATIVE mg/dL   Hgb urine dipstick NEGATIVE NEGATIVE   Bilirubin Urine NEGATIVE NEGATIVE   Ketones, ur NEGATIVE NEGATIVE mg/dL   Protein, ur NEGATIVE NEGATIVE mg/dL   Nitrite NEGATIVE NEGATIVE   Leukocytes,Ua NEGATIVE NEGATIVE    Comment: Performed at [facility], [address], [place], [place] [address]  CBG monitoring, ED     Status: Abnormal   Collection Time: 09/17/23 10:08 PM  Result Value Ref Range   Glucose-Capillary 110 (H) 70 - 99 mg/dL    Comment: Glucose reference range applies only to samples taken after fasting for at least 8 hours.   CT Lumbar Spine Wo Contrast Result Date: 09/17/2023 CLINICAL DATA:  Lumbar radiculopathy.  Trip and fall.  Back pain. EXAM: CT LUMBAR SPINE WITHOUT CONTRAST TECHNIQUE: Multidetector CT imaging of  the lumbar spine was performed without intravenous contrast administration. Multiplanar CT image reconstructions were also generated. RADIATION DOSE REDUCTION: This exam was performed according to the departmental dose-optimization program which includes  automated exposure control, adjustment of the mA and/or kV according to patient size and/or use of iterative reconstruction technique. COMPARISON:  None Available. FINDINGS: Segmentation: 5 lumbar type vertebrae. Alignment: Normal. Vertebrae: Comminuted fracture through the anterior middle columns of L1 vertebra. There is mild displacement of the superior endplate. 25% loss of height centrally. There is slight buckling of the posterior cortex. No extension into the lamina or pedicles. Prominent Schmorl's node involving superior endplate of T12 is chronic when compared to prior thoracic spine MRI. The bones are subjectively under mineralized. No additional acute fracture. The included sacrum is intact. Paraspinal and other soft tissues: Minimal L1 perispinal soft tissue thickening at site of fracture. Aortic atherosclerosis. Colonic diverticulosis. Disc levels: Moderate degenerative disc disease at L2-L3 and L5-S1. Multilevel facet hypertrophy. IMPRESSION: 1. Comminuted fracture through the anterior and middle columns of L1 vertebra with 25% loss of height centrally. Slight buckling of the posterior cortex. No extension into the lamina or pedicles. 2. Moderate degenerative disc disease at L2-L3 and L5-S1. Multilevel facet hypertrophy. Aortic Atherosclerosis (ICD10-I70.0). Electronically Signed   By: Andrea Gasman M.D.   On: 09/17/2023 17:27   CT Cervical Spine Wo Contrast Result Date: 09/17/2023 CLINICAL DATA:  Neck trauma (Age >= 65y) Trip and fall. EXAM: CT CERVICAL SPINE WITHOUT CONTRAST TECHNIQUE: Multidetector CT imaging of the cervical spine was performed without intravenous contrast. Multiplanar CT image reconstructions were also generated. RADIATION  DOSE REDUCTION: This exam was performed according to the departmental dose-optimization program which includes automated exposure control, adjustment of the mA and/or kV according to patient size and/or use of iterative reconstruction technique. COMPARISON:  None Available. FINDINGS: Alignment: No traumatic subluxation. Trace anterolisthesis of C3 on C4 and C5 on C6 likely due to facet hypertrophy. Skull base and vertebrae: No acute fracture. Cervical vertebral body heights are maintained. Chronic mild T2 and T3 compression deformities, unchanged from 2018 thoracic spine MRI. The dens and skull base are intact. Soft tissues and spinal canal: No prevertebral fluid or swelling. No visible canal hematoma. Disc levels: Mild multilevel degenerative disc disease with moderate diffuse facet hypertrophy. Upper chest: No acute findings. Right thyroid  nodule was previously assessed on thyroid  ultrasound. Other: None. IMPRESSION: 1. No acute fracture or traumatic subluxation of the cervical spine. 2. Multilevel degenerative disc disease and facet hypertrophy. Electronically Signed   By: Andrea Gasman M.D.   On: 09/17/2023 17:22   CT Head Wo Contrast Result Date: 09/17/2023 CLINICAL DATA:  Trip and fall. EXAM: CT HEAD WITHOUT CONTRAST TECHNIQUE: Contiguous axial images were obtained from the base of the skull through the vertex without intravenous contrast. RADIATION DOSE REDUCTION: This exam was performed according to the departmental dose-optimization program which includes automated exposure control, adjustment of the mA and/or kV according to patient size and/or use of iterative reconstruction technique. COMPARISON:  Head CT 08/05/2016 FINDINGS: Brain: No intracranial hemorrhage, mass effect, or midline shift. Brain volume is normal for age. No hydrocephalus. The basilar cisterns are patent. Periventricular and deep white matter hypodensity, nonspecific but typical of chronic small vessel ischemia. Mild progression  from remote exam. No evidence of territorial infarct or acute ischemia. No extra-axial or intracranial fluid collection. Vascular: Atherosclerosis of skullbase vasculature without hyperdense vessel or abnormal calcification. Skull: No fracture or focal lesion. Sinuses/Orbits: No acute finding. Other: No confluent scalp hematoma. IMPRESSION: 1. No acute intracranial abnormality. No skull fracture. 2. Chronic small vessel ischemia. Electronically Signed   By: Andrea Gasman M.D.   On: 09/17/2023 17:17  DG Hip Unilat With Pelvis 2-3 Views Left Result Date: 09/17/2023 CLINICAL DATA:  Trip and fall with bilateral hip pain. EXAM: DG HIP (WITH OR WITHOUT PELVIS) 2-3V LEFT; DG HIP (WITH OR WITHOUT PELVIS) 2-3V RIGHT COMPARISON:  None Available. FINDINGS: No acute fracture of the pelvis or hips. No hip dislocation. There is bilateral hip joint space narrowing with acetabular spurring. Pubic rami are intact. Pubic symphysis and sacroiliac joints are congruent. No gross disruption of sacral ala. No erosive change. Pelvic phleboliths. IMPRESSION: 1. No acute fracture of the pelvis or hips. 2. Mild bilateral hip osteoarthritis. Electronically Signed   By: Andrea Gasman M.D.   On: 09/17/2023 15:13   DG Hip Unilat  With Pelvis 2-3 Views Right Result Date: 09/17/2023 CLINICAL DATA:  Trip and fall with bilateral hip pain. EXAM: DG HIP (WITH OR WITHOUT PELVIS) 2-3V LEFT; DG HIP (WITH OR WITHOUT PELVIS) 2-3V RIGHT COMPARISON:  None Available. FINDINGS: No acute fracture of the pelvis or hips. No hip dislocation. There is bilateral hip joint space narrowing with acetabular spurring. Pubic rami are intact. Pubic symphysis and sacroiliac joints are congruent. No gross disruption of sacral ala. No erosive change. Pelvic phleboliths. IMPRESSION: 1. No acute fracture of the pelvis or hips. 2. Mild bilateral hip osteoarthritis. Electronically Signed   By: Andrea Gasman M.D.   On: 09/17/2023 15:13    Pending Labs Unresulted  Labs (From admission, onward)     Start     Ordered   09/18/23 0500  Vitamin B12  (Anemia Panel (PNL))  Tomorrow morning,   R        09/17/23 2051   09/18/23 0500  Folate  (Anemia Panel (PNL))  Tomorrow morning,   R        09/17/23 2051   09/17/23 2300  Basic metabolic panel  Once,   R        09/17/23 2103   09/17/23 2055  Hemoglobin A1c  Add-on,   AD       Comments: To assess prior glycemic control    09/17/23 2054   09/17/23 2053  Creatinine, urine, random  Once,   R        09/17/23 2052   09/17/23 2053  Osmolality, urine  Once,   R        09/17/23 2052   09/17/23 2053  Osmolality  Add-on,   AD        09/17/23 2052   09/17/23 2053  Sodium, urine, random  Once,   R        09/17/23 2052   09/17/23 2052  Iron and TIBC  (Anemia Panel (PNL))  Add-on,   AD        09/17/23 2051   09/17/23 2052  Ferritin  (Anemia Panel (PNL))  Add-on,   AD        09/17/23 2051   09/17/23 2052  Reticulocytes  (Anemia Panel (PNL))  Add-on,   AD        09/17/23 2051   09/17/23 2052  VITAMIN D  25 Hydroxy (Vit-D Deficiency, Fractures)  Once,   R        09/17/23 2051   09/17/23 2051  CK  Add-on,   AD        09/17/23 2051   09/17/23 2051  Magnesium  Add-on,   AD        09/17/23 2051   09/17/23 2051  Phosphorus  Add-on,   AD        09/17/23  2051   09/17/23 2051  TSH  Add-on,   AD        09/17/23 2051   Signed and Held  Magnesium  Tomorrow morning,   R        Signed and Held   Signed and Held  Phosphorus  Tomorrow morning,   R        Signed and Held   Signed and Held  Comprehensive metabolic panel  Tomorrow morning,   R       Question:  Release to patient  Answer:  Immediate   Signed and Held   Signed and Held  CBC  Tomorrow morning,   R       Question:  Release to patient  Answer:  Immediate   Signed and Held            Vitals/Pain Today's Vitals   09/17/23 1856 09/17/23 1859 09/17/23 2056 09/17/23 2210  BP: (!) 144/72   (!) 148/52  Pulse: 86   85  Resp: 16   14  Temp: 97.9 F (36.6 C)    98.3 F (36.8 C)  TempSrc: Oral   Oral  SpO2: 97%   96%  PainSc:  9  4  5      Isolation Precautions No active isolations  Medications Medications  insulin  aspart (novoLOG ) injection 0-9 Units ( Subcutaneous Not Given 09/17/23 2211)  morphine  (PF) 2 MG/ML injection 2 mg (2 mg Intravenous Given 09/17/23 1625)  lactated ringers  bolus 500 mL (0 mLs Intravenous Stopped 09/17/23 1901)  morphine  (PF) 2 MG/ML injection 2 mg (2 mg Intravenous Given 09/17/23 1737)  HYDROmorphone  (DILAUDID ) injection 0.5 mg (0.5 mg Intravenous Given 09/17/23 1859)  lactated ringers  bolus 500 mL (500 mLs Intravenous New Bag/Given 09/17/23 2053)    Mobility walks with person assist     Focused Assessments Cardiac Assessment Handoff:    No results found for: CKTOTAL, CKMB, CKMBINDEX, TROPONINI No results found for: DDIMER Does the Patient currently have chest pain? No   , Neuro Assessment Handoff:  Swallow screen pass? Yes          Neuro Assessment:   Neuro Checks:      Twana   R Recommendations: See Admitting Provider Note  Report given to:   Hoy RN

## 2023-09-17 NOTE — Assessment & Plan Note (Signed)
 In the setting of dehydration will rehydrate and recheck

## 2023-09-17 NOTE — Assessment & Plan Note (Signed)
 Continue with brace pain control will need to follow-up as an outpatient.  Neurosurgery

## 2023-09-17 NOTE — Assessment & Plan Note (Signed)
-   Order Sensitive  SSI

## 2023-09-18 ENCOUNTER — Observation Stay (HOSPITAL_COMMUNITY)

## 2023-09-18 DIAGNOSIS — N183 Chronic kidney disease, stage 3 unspecified: Secondary | ICD-10-CM | POA: Diagnosis not present

## 2023-09-18 DIAGNOSIS — E1165 Type 2 diabetes mellitus with hyperglycemia: Secondary | ICD-10-CM | POA: Diagnosis not present

## 2023-09-18 DIAGNOSIS — E559 Vitamin D deficiency, unspecified: Secondary | ICD-10-CM | POA: Diagnosis not present

## 2023-09-18 DIAGNOSIS — R55 Syncope and collapse: Secondary | ICD-10-CM | POA: Diagnosis not present

## 2023-09-18 DIAGNOSIS — Z881 Allergy status to other antibiotic agents status: Secondary | ICD-10-CM | POA: Diagnosis not present

## 2023-09-18 DIAGNOSIS — Z882 Allergy status to sulfonamides status: Secondary | ICD-10-CM | POA: Diagnosis not present

## 2023-09-18 DIAGNOSIS — S32018A Other fracture of first lumbar vertebra, initial encounter for closed fracture: Secondary | ICD-10-CM | POA: Diagnosis not present

## 2023-09-18 DIAGNOSIS — M1712 Unilateral primary osteoarthritis, left knee: Secondary | ICD-10-CM | POA: Diagnosis present

## 2023-09-18 DIAGNOSIS — Z8249 Family history of ischemic heart disease and other diseases of the circulatory system: Secondary | ICD-10-CM | POA: Diagnosis not present

## 2023-09-18 DIAGNOSIS — E039 Hypothyroidism, unspecified: Secondary | ICD-10-CM | POA: Diagnosis not present

## 2023-09-18 DIAGNOSIS — E860 Dehydration: Secondary | ICD-10-CM | POA: Diagnosis present

## 2023-09-18 DIAGNOSIS — Y92009 Unspecified place in unspecified non-institutional (private) residence as the place of occurrence of the external cause: Secondary | ICD-10-CM | POA: Diagnosis not present

## 2023-09-18 DIAGNOSIS — R2689 Other abnormalities of gait and mobility: Secondary | ICD-10-CM | POA: Diagnosis not present

## 2023-09-18 DIAGNOSIS — K5900 Constipation, unspecified: Secondary | ICD-10-CM | POA: Diagnosis present

## 2023-09-18 DIAGNOSIS — Z888 Allergy status to other drugs, medicaments and biological substances status: Secondary | ICD-10-CM | POA: Diagnosis not present

## 2023-09-18 DIAGNOSIS — E1122 Type 2 diabetes mellitus with diabetic chronic kidney disease: Secondary | ICD-10-CM | POA: Diagnosis not present

## 2023-09-18 DIAGNOSIS — Z9109 Other allergy status, other than to drugs and biological substances: Secondary | ICD-10-CM | POA: Diagnosis not present

## 2023-09-18 DIAGNOSIS — S32010D Wedge compression fracture of first lumbar vertebra, subsequent encounter for fracture with routine healing: Secondary | ICD-10-CM | POA: Diagnosis not present

## 2023-09-18 DIAGNOSIS — N179 Acute kidney failure, unspecified: Secondary | ICD-10-CM | POA: Diagnosis not present

## 2023-09-18 DIAGNOSIS — N1832 Chronic kidney disease, stage 3b: Secondary | ICD-10-CM | POA: Diagnosis not present

## 2023-09-18 DIAGNOSIS — D631 Anemia in chronic kidney disease: Secondary | ICD-10-CM | POA: Diagnosis present

## 2023-09-18 DIAGNOSIS — I129 Hypertensive chronic kidney disease with stage 1 through stage 4 chronic kidney disease, or unspecified chronic kidney disease: Secondary | ICD-10-CM | POA: Diagnosis not present

## 2023-09-18 DIAGNOSIS — W19XXXA Unspecified fall, initial encounter: Secondary | ICD-10-CM | POA: Diagnosis present

## 2023-09-18 DIAGNOSIS — I10 Essential (primary) hypertension: Secondary | ICD-10-CM | POA: Diagnosis not present

## 2023-09-18 DIAGNOSIS — Z79891 Long term (current) use of opiate analgesic: Secondary | ICD-10-CM | POA: Diagnosis not present

## 2023-09-18 DIAGNOSIS — Z9181 History of falling: Secondary | ICD-10-CM | POA: Diagnosis not present

## 2023-09-18 DIAGNOSIS — Z87891 Personal history of nicotine dependence: Secondary | ICD-10-CM | POA: Diagnosis not present

## 2023-09-18 DIAGNOSIS — M6281 Muscle weakness (generalized): Secondary | ICD-10-CM | POA: Diagnosis not present

## 2023-09-18 DIAGNOSIS — R2681 Unsteadiness on feet: Secondary | ICD-10-CM | POA: Diagnosis not present

## 2023-09-18 DIAGNOSIS — E782 Mixed hyperlipidemia: Secondary | ICD-10-CM | POA: Diagnosis not present

## 2023-09-18 DIAGNOSIS — R1312 Dysphagia, oropharyngeal phase: Secondary | ICD-10-CM | POA: Diagnosis not present

## 2023-09-18 DIAGNOSIS — Z79899 Other long term (current) drug therapy: Secondary | ICD-10-CM | POA: Diagnosis not present

## 2023-09-18 DIAGNOSIS — Z79890 Hormone replacement therapy (postmenopausal): Secondary | ICD-10-CM | POA: Diagnosis not present

## 2023-09-18 DIAGNOSIS — Z833 Family history of diabetes mellitus: Secondary | ICD-10-CM | POA: Diagnosis not present

## 2023-09-18 LAB — BASIC METABOLIC PANEL WITH GFR
Anion gap: 13 (ref 5–15)
BUN: 36 mg/dL — ABNORMAL HIGH (ref 8–23)
CO2: 22 mmol/L (ref 22–32)
Calcium: 9.4 mg/dL (ref 8.9–10.3)
Chloride: 100 mmol/L (ref 98–111)
Creatinine, Ser: 1.7 mg/dL — ABNORMAL HIGH (ref 0.44–1.00)
GFR, Estimated: 30 mL/min — ABNORMAL LOW (ref >60)
Glucose, Bld: 116 mg/dL — ABNORMAL HIGH (ref 70–99)
Potassium: 4.3 mmol/L (ref 3.5–5.1)
Sodium: 135 mmol/L (ref 135–145)

## 2023-09-18 LAB — IRON AND TIBC
Iron: 18 ug/dL — ABNORMAL LOW (ref 28–170)
Saturation Ratios: 6 % — ABNORMAL LOW (ref 10.4–31.8)
TIBC: 315 ug/dL (ref 250–450)
UIBC: 297 ug/dL

## 2023-09-18 LAB — COMPREHENSIVE METABOLIC PANEL WITH GFR
ALT: 20 U/L (ref 0–44)
AST: 28 U/L (ref 15–41)
Albumin: 4 g/dL (ref 3.5–5.0)
Alkaline Phosphatase: 120 U/L (ref 38–126)
Anion gap: 14 (ref 5–15)
BUN: 33 mg/dL — ABNORMAL HIGH (ref 8–23)
CO2: 23 mmol/L (ref 22–32)
Calcium: 9.6 mg/dL (ref 8.9–10.3)
Chloride: 99 mmol/L (ref 98–111)
Creatinine, Ser: 1.69 mg/dL — ABNORMAL HIGH (ref 0.44–1.00)
GFR, Estimated: 30 mL/min — ABNORMAL LOW (ref >60)
Glucose, Bld: 91 mg/dL (ref 70–99)
Potassium: 4.4 mmol/L (ref 3.5–5.1)
Sodium: 136 mmol/L (ref 135–145)
Total Bilirubin: 0.5 mg/dL (ref 0.0–1.2)
Total Protein: 6.3 g/dL — ABNORMAL LOW (ref 6.5–8.1)

## 2023-09-18 LAB — ECHOCARDIOGRAM COMPLETE
AR max vel: 2.68 cm2
AV Area VTI: 3.39 cm2
AV Area mean vel: 2.89 cm2
AV Mean grad: 4 mmHg
AV Peak grad: 8.4 mmHg
Ao pk vel: 1.45 m/s
Area-P 1/2: 2.5 cm2
Height: 64 in
S' Lateral: 2.5 cm
Weight: 2356.28 [oz_av]

## 2023-09-18 LAB — MAGNESIUM
Magnesium: 2 mg/dL (ref 1.7–2.4)
Magnesium: 2.1 mg/dL (ref 1.7–2.4)

## 2023-09-18 LAB — FOLATE: Folate: 14.5 ng/mL (ref >5.9)

## 2023-09-18 LAB — CBC
HCT: 34.7 % — ABNORMAL LOW (ref 36.0–46.0)
Hemoglobin: 10.7 g/dL — ABNORMAL LOW (ref 12.0–15.0)
MCH: 28 pg (ref 26.0–34.0)
MCHC: 30.8 g/dL (ref 30.0–36.0)
MCV: 90.8 fL (ref 80.0–100.0)
Platelets: 257 K/uL (ref 150–400)
RBC: 3.82 MIL/uL — ABNORMAL LOW (ref 3.87–5.11)
RDW: 14.2 % (ref 11.5–15.5)
WBC: 12.2 K/uL — ABNORMAL HIGH (ref 4.0–10.5)
nRBC: 0 % (ref 0.0–0.2)

## 2023-09-18 LAB — GLUCOSE, CAPILLARY
Glucose-Capillary: 93 mg/dL (ref 70–99)
Glucose-Capillary: 106 mg/dL — ABNORMAL HIGH (ref 70–99)
Glucose-Capillary: 105 mg/dL — ABNORMAL HIGH (ref 70–99)
Glucose-Capillary: 108 mg/dL — ABNORMAL HIGH (ref 70–99)
Glucose-Capillary: 120 mg/dL — ABNORMAL HIGH (ref 70–99)

## 2023-09-18 LAB — RETICULOCYTES
Immature Retic Fract: 6.9 % (ref 2.3–15.9)
RBC.: 3.71 MIL/uL — ABNORMAL LOW (ref 3.87–5.11)
Retic Count, Absolute: 53.8 K/uL (ref 19.0–186.0)
Retic Ct Pct: 1.5 % (ref 0.4–3.1)

## 2023-09-18 LAB — HEMOGLOBIN A1C
Hgb A1c MFr Bld: 6.6 % — ABNORMAL HIGH (ref 4.8–5.6)
Mean Plasma Glucose: 142.72 mg/dL

## 2023-09-18 LAB — TROPONIN T, HIGH SENSITIVITY
Troponin T High Sensitivity: 27 ng/L — ABNORMAL HIGH (ref 0–19)
Troponin T High Sensitivity: 28 ng/L — ABNORMAL HIGH (ref 0–19)

## 2023-09-18 LAB — PHOSPHORUS
Phosphorus: 3.1 mg/dL (ref 2.5–4.6)
Phosphorus: 3 mg/dL (ref 2.5–4.6)

## 2023-09-18 LAB — OSMOLALITY: Osmolality: 298 mosm/kg — ABNORMAL HIGH (ref 275–295)

## 2023-09-18 LAB — VITAMIN D 25 HYDROXY (VIT D DEFICIENCY, FRACTURES): Vit D, 25-Hydroxy: 21.49 ng/mL — ABNORMAL LOW (ref 30–100)

## 2023-09-18 LAB — FERRITIN: Ferritin: 312 ng/mL — ABNORMAL HIGH (ref 11–307)

## 2023-09-18 LAB — TSH: TSH: 2.6 u[IU]/mL (ref 0.350–4.500)

## 2023-09-18 LAB — VITAMIN B12: Vitamin B-12: 500 pg/mL (ref 180–914)

## 2023-09-18 LAB — CK: Total CK: 104 U/L (ref 38–234)

## 2023-09-18 MED ORDER — SODIUM CHLORIDE 0.9 % IV SOLN: INTRAVENOUS | Status: DC

## 2023-09-18 MED ORDER — GABAPENTIN 300 MG PO CAPS
300.0000 mg | ORAL_CAPSULE | Freq: Every day | ORAL | Status: DC
  Administered 2023-09-18 – 2023-09-20 (×3): 300 mg via ORAL
  Filled 2023-09-18 (×3): qty 1

## 2023-09-18 MED ORDER — VITAMIN D (ERGOCALCIFEROL) 1.25 MG (50000 UNIT) PO CAPS
50000.0000 [IU] | ORAL_CAPSULE | ORAL | Status: DC
  Administered 2023-09-18: 50000 [IU] via ORAL
  Filled 2023-09-18: qty 1

## 2023-09-18 MED ORDER — ENOXAPARIN SODIUM 30 MG/0.3ML IJ SOSY
30.0000 mg | PREFILLED_SYRINGE | Freq: Every day | INTRAMUSCULAR | Status: DC
  Administered 2023-09-18 – 2023-09-21 (×4): 30 mg via SUBCUTANEOUS
  Filled 2023-09-18 (×4): qty 0.3

## 2023-09-18 MED ORDER — PROCHLORPERAZINE EDISYLATE 10 MG/2ML IJ SOLN
10.0000 mg | Freq: Four times a day (QID) | INTRAMUSCULAR | Status: DC | PRN
  Administered 2023-09-18 – 2023-09-20 (×3): 10 mg via INTRAVENOUS
  Filled 2023-09-18 (×3): qty 2

## 2023-09-18 MED ORDER — HYDROMORPHONE HCL 1 MG/ML IJ SOLN
0.5000 mg | INTRAMUSCULAR | Status: DC | PRN
  Administered 2023-09-18 – 2023-09-20 (×7): 1 mg via INTRAVENOUS
  Filled 2023-09-18 (×6): qty 1

## 2023-09-18 MED ORDER — ORAL CARE MOUTH RINSE
15.0000 mL | OROMUCOSAL | Status: DC | PRN
Start: 2023-09-18 — End: 2023-09-21

## 2023-09-18 NOTE — Plan of Care (Signed)

## 2023-09-18 NOTE — Progress Notes (Signed)
 Orthopedic Tech Progress Note Patient Details:  Madison Hernandez 01/19/1940 [id number] Medium LSO back brace was delivered to bedside.  Ortho Devices Type of Ortho Device: Lumbar corsett Ortho Device/Splint Location: Lumbar spine Ortho Device/Splint Interventions: Ordered, Adjustment      Aadhira Heffernan E Kenidi Elenbaas 09/18/2023, 10:18 AM

## 2023-09-18 NOTE — Progress Notes (Signed)
 Blood glucose checked results have not updated in system CBG 93 @ 0800; 106 @ 1200; 108@ 1610.

## 2023-09-18 NOTE — Evaluation (Signed)
 Physical Therapy Evaluation Patient Details Name: Madison Hernandez MRN: [id number] DOB: [birth year] Today's Date: 09/18/2023  History of Present Illness  Madison Hernandez is a 83 y.o. female admitted under observation 09/17/23 after a fall at home. CT lumbar spine -  Comminuted fracture through the anterior and middle columns of L1 vertebra with 25% loss of height centrally. PMH includes HLD, HTN, hypothyroidism, Dm2, anemia. CKD 3b  Clinical Impression  Pt admitted with above diagnosis.  Pt begrudgingly agreed to sit EOB, allow LSO brace fitting. Pt was able to log roll and transition s/l to sit,  stand and take a few steps along EOB with RW and CGA-min assist. Pt requested to go back to bed to nap. Educated verbally, and reviewed  back handout BLT back precautions. Pt had 0/3 recall from OT session earlier today. Pt lives alone and does not have external support.  Patient will likely benefit  from continued inpatient follow up therapy, <3 hours/day   Pt currently with functional limitations due to the deficits listed below (see PT Problem List). Pt will benefit from acute skilled PT to increase their independence and safety with mobility to allow discharge.           If plan is discharge home, recommend the following: A little help with walking and/or transfers;A little help with bathing/dressing/bathroom;Assistance with cooking/housework;Help with stairs or ramp for entrance;Assist for transportation   Can travel by private vehicle   Yes    Equipment Recommendations None recommended by PT (defer to SNF)  Recommendations for Other Services       Functional Status Assessment Patient has had a recent decline in their functional status and demonstrates the ability to make significant improvements in function in a reasonable and predictable amount of time.     Precautions / Restrictions Precautions Precautions: Back Recall of Precautions/Restrictions: Intact Required Braces or Orthoses:  Spinal Brace Spinal Brace: Lumbar corset;Other (comment) Spinal Brace Comments: brace fitted and donned/doffed - pt educated however began perseverating on the fact that brace scratched her arm Restrictions Weight Bearing Restrictions Per Provider Order: No      Mobility  Bed Mobility   Bed Mobility: Rolling, Sit to Sidelying, Sidelying to Sit Rolling: Contact guard assist Sidelying to sit: Contact guard assist     Sit to sidelying: Contact guard assist General bed mobility comments: no physical assist, educated on log roll technique for back precautions, heavy cues to follow back precautions    Transfers Overall transfer level: Needs assistance Equipment used: Rolling walker (2 wheels) Transfers: Sit to/from Stand Sit to Stand: Min assist           General transfer comment: vc for safe hand placement with transfers, new use of RW for pain management    Ambulation/Gait               General Gait Details: lateral steps along EOB with RW and min assist  Stairs            Wheelchair Mobility     Tilt Bed    Modified Rankin (Stroke Patients Only)       Balance Overall balance assessment: Needs assistance Sitting-balance support: Single extremity supported, Feet supported Sitting balance-Leahy Scale: Fair Sitting balance - Comments: unchallenged   Standing balance support: Reliant on assistive device for balance, During functional activity, Bilateral upper extremity supported Standing balance-Leahy Scale: Poor  Pertinent Vitals/Pain Pain Assessment Pain Assessment: 0-10 Pain Score: 6  Pain Location: back and R hip Pain Descriptors / Indicators: Discomfort, Grimacing Pain Intervention(s): Limited activity within patient's tolerance, Monitored during session, Premedicated before session    Home Living Family/patient expects to be discharged to:: Private residence Living Arrangements: Alone   Type of  Home: Apartment Home Access: Level entry       Home Layout: One level Home Equipment: None      Prior Function Prior Level of Function : Independent/Modified Independent;Driving;History of Falls (last six months)             Mobility Comments: no DME ADLs Comments: does all IADL and ADL     Extremity/Trunk Assessment   Upper Extremity Assessment Upper Extremity Assessment: Defer to OT evaluation;Overall WFL for tasks assessed    Lower Extremity Assessment Lower Extremity Assessment: Overall WFL for tasks assessed    Cervical / Trunk Assessment Cervical / Trunk Assessment: Other exceptions Cervical / Trunk Exceptions: new L1 fx, outpatient follow up  Communication        Cognition Arousal: Alert Behavior During Therapy: WFL for tasks assessed/performed, Flat affect   PT - Cognitive impairments: Memory, Awareness                       PT - Cognition Comments: limited carryover noted during session, no recall of OT session         Cueing       General Comments      Exercises     Assessment/Plan    PT Assessment Patient needs continued PT services  PT Problem List Decreased activity tolerance;Decreased balance;Decreased knowledge of use of DME;Pain;Decreased mobility;Decreased knowledge of precautions;Decreased safety awareness       PT Treatment Interventions DME instruction;Therapeutic exercise;Gait training;Functional mobility training;Therapeutic activities;Patient/family education    PT Goals (Current goals can be found in the Care Plan section)  Acute Rehab PT Goals Patient Stated Goal: less pain PT Goal Formulation: With patient Time For Goal Achievement: 10/02/23 Potential to Achieve Goals: Good    Frequency       Co-evaluation               AM-PAC PT 6 Clicks Mobility  Outcome Measure Help needed turning from your back to your side while in a flat bed without using bedrails?: A Little Help needed moving from lying  on your back to sitting on the side of a flat bed without using bedrails?: A Little Help needed moving to and from a bed to a chair (including a wheelchair)?: A Little Help needed standing up from a chair using your arms (e.g., wheelchair or bedside chair)?: A Little Help needed to walk in hospital room?: A Little Help needed climbing 3-5 steps with a railing? : A Lot 6 Click Score: 17    End of Session   Activity Tolerance: Patient tolerated treatment well;Patient limited by fatigue Patient left: in bed;with call bell/phone within reach;with bed alarm set Nurse Communication: Mobility status PT Visit Diagnosis: Other abnormalities of gait and mobility (R26.89)    Time: 8398-8384 PT Time Calculation (min) (ACUTE ONLY): 14 min   Charges:   PT Evaluation $PT Eval Low Complexity: 1 Low   PT General Charges $$ ACUTE PT VISIT: 1 Visit         Madison Hernandez, PT  Acute Rehab Dept [facility]) [phone]  09/18/2023   [facility] 09/18/2023, 4:39 PM

## 2023-09-18 NOTE — Progress Notes (Signed)
 [facility] PROGRESS NOTE   Madison Hernandez FMW:[id number] DOB: [birth year] DOA: 09/17/2023  PCP: Micheal Wolm ORN, MD  Brief History: 83 y.o. female with medical history significant of HLD, HTN, hypothyroidism, Dm2, anemia, CKD 3b who presented after sustaining a fall at home.  There was concern that she may have had a syncopal episode.  Patient was noted to be dehydrated.  Found to have L1 compression fracture.  Was hospitalized for further management.  Consultants: Phone discussion with neurosurgery by EDP  Procedures: None    Subjective/Interval History: Patient states that she does not have significant pain while laying in the bed but pain increases significantly with any movement.  Denies any chest pain shortness of breath.  No nausea vomiting this morning.    Assessment/Plan:  Syncope Possibly in the setting of dehydration and AKI.  Etiology unclear. Monitor on telemetry. Follow-up on echocardiogram. Hydration status appears to have improved with IV fluids. Check orthostatics.  Acute kidney injury on chronic kidney disease stage IIIb Baseline creatinine seems to be around 1.6.  Came in with a creatinine of around 2.  Was hydrated with improvement in creatinine noted today.  Avoid nephrotoxic agents.  Monitor urine output.  L1 compression fracture Case was discussed with neurosurgery by EDP.  They recommended LSO brace and outpatient follow-up.  No role for any procedures in the hospital at this time.  Patient does not have any neurological deficits.  PT OT eval.  Pain control.  Essential hypertension Holding ARB due to AKI.  Hypothyroidism Continue levothyroxine .  Diabetes mellitus type 2 CBGs to be monitored.  SSI.  Normocytic anemia Hemoglobin is stable.  No evidence of overt bleeding.  Leukocytosis Most likely reactive.  UA was unremarkable.  Improvement noted today.  She is afebrile.  Vitamin D  deficiency Start vitamin D  supplementation  DVT  Prophylaxis: Initiate Lovenox  Code Status: Full code Family Communication: Discussed with patient Disposition Plan: To be determined  Status is: Observation The patient will require care spanning > 2 midnights and should be moved to inpatient because: Ambulatory dysfunction due to L1 compression fracture with severe pain      Medications: Scheduled:  gabapentin   300 mg Oral TID   insulin  aspart  0-9 Units Subcutaneous Q4H   levothyroxine   25 mcg Oral QAC breakfast   linaclotide   145 mcg Oral QAC breakfast   rosuvastatin   40 mg Oral Daily   Continuous: PRN:acetaminophen  **OR** acetaminophen , HYDROcodone -acetaminophen , HYDROmorphone  (DILAUDID ) injection, ondansetron  **OR** ondansetron  (ZOFRAN ) IV, mouth rinse  Antibiotics: Anti-infectives (From admission, onward)    None       Objective:  Vital Signs  Vitals:   09/18/23 0700 09/18/23 0743 09/18/23 0745 09/18/23 0800  BP:      Pulse:      Resp: (!) 28 (!) 22 15 17   Temp:      TempSrc:      SpO2:      Weight:      Height:        Intake/Output Summary (Last 24 hours) at 09/18/2023 1013 Last data filed at 09/18/2023 0914 Gross per 24 hour  Intake 571.26 ml  Output 700 ml  Net -128.74 ml   Filed Weights   09/17/23 2317  Weight: 66.8 kg    General appearance: Awake alert.  In no distress Resp: Clear to auscultation bilaterally.  Normal effort Cardio: S1-S2 is normal regular.  No S3-S4.  No rubs murmurs or bruit GI: Abdomen is soft.  Nontender nondistended.  Bowel sounds are  present normal.  No masses organomegaly Extremities: Moving all of her extremities.  Some limitation noted due to pain in the lower back Neurologic: Alert and oriented x3.  No focal neurological deficits.    Lab Results:  Data Reviewed: I have personally reviewed following labs and reports of the imaging studies  CBC: Recent Labs  Lab 09/17/23 1557 09/18/23 0644  WBC 19.8* 12.2*  NEUTROABS 17.2*  --   HGB 10.9* 10.7*  HCT 34.3*  34.7*  MCV 89.3 90.8  PLT 312 257    Basic Metabolic Panel: Recent Labs  Lab 09/17/23 1557 09/18/23 0026 09/18/23 0644  NA 133* 135 136  K 4.0 4.3 4.4  CL 97* 100 99  CO2 22 22 23   GLUCOSE 122* 116* 91  BUN 39* 36* 33*  CREATININE 2.15* 1.70* 1.69*  CALCIUM  10.0 9.4 9.6  MG  --  2.0 2.1  PHOS  --  3.1 3.0    GFR: Estimated Creatinine Clearance: 24.5 mL/min (A) (by C-G formula based on SCr of 1.69 mg/dL (H)).  Liver Function Tests: Recent Labs  Lab 09/17/23 1557 09/18/23 0644  AST 26 28  ALT 20 20  ALKPHOS 135* 120  BILITOT 0.3 0.5  PROT 6.5 6.3*  ALBUMIN 4.1 4.0    Cardiac Enzymes: Recent Labs  Lab 09/18/23 0026  CKTOTAL 104   HbA1C: Recent Labs    09/18/23 0026  HGBA1C 6.6*    CBG: Recent Labs  Lab 09/17/23 2208 09/18/23 0756  GLUCAP 110* 93    Thyroid  Function Tests: Recent Labs    09/18/23 0026  TSH 2.600    Anemia Panel: Recent Labs    09/18/23 0026 09/18/23 0644  VITAMINB12  --  500  FOLATE  --  14.5  RETICCTPCT 1.5  --     Radiology Studies: CT Lumbar Spine Wo Contrast Result Date: 09/17/2023 CLINICAL DATA:  Lumbar radiculopathy.  Trip and fall.  Back pain. EXAM: CT LUMBAR SPINE WITHOUT CONTRAST TECHNIQUE: Multidetector CT imaging of the lumbar spine was performed without intravenous contrast administration. Multiplanar CT image reconstructions were also generated. RADIATION DOSE REDUCTION: This exam was performed according to the departmental dose-optimization program which includes automated exposure control, adjustment of the mA and/or kV according to patient size and/or use of iterative reconstruction technique. COMPARISON:  None Available. FINDINGS: Segmentation: 5 lumbar type vertebrae. Alignment: Normal. Vertebrae: Comminuted fracture through the anterior middle columns of L1 vertebra. There is mild displacement of the superior endplate. 25% loss of height centrally. There is slight buckling of the posterior cortex. No  extension into the lamina or pedicles. Prominent Schmorl's node involving superior endplate of T12 is chronic when compared to prior thoracic spine MRI. The bones are subjectively under mineralized. No additional acute fracture. The included sacrum is intact. Paraspinal and other soft tissues: Minimal L1 perispinal soft tissue thickening at site of fracture. Aortic atherosclerosis. Colonic diverticulosis. Disc levels: Moderate degenerative disc disease at L2-L3 and L5-S1. Multilevel facet hypertrophy. IMPRESSION: 1. Comminuted fracture through the anterior and middle columns of L1 vertebra with 25% loss of height centrally. Slight buckling of the posterior cortex. No extension into the lamina or pedicles. 2. Moderate degenerative disc disease at L2-L3 and L5-S1. Multilevel facet hypertrophy. Aortic Atherosclerosis (ICD10-I70.0). Electronically Signed   By: Andrea Gasman M.D.   On: 09/17/2023 17:27   CT Cervical Spine Wo Contrast Result Date: 09/17/2023 CLINICAL DATA:  Neck trauma (Age >= 65y) Trip and fall. EXAM: CT CERVICAL SPINE WITHOUT CONTRAST TECHNIQUE: Multidetector CT  imaging of the cervical spine was performed without intravenous contrast. Multiplanar CT image reconstructions were also generated. RADIATION DOSE REDUCTION: This exam was performed according to the departmental dose-optimization program which includes automated exposure control, adjustment of the mA and/or kV according to patient size and/or use of iterative reconstruction technique. COMPARISON:  None Available. FINDINGS: Alignment: No traumatic subluxation. Trace anterolisthesis of C3 on C4 and C5 on C6 likely due to facet hypertrophy. Skull base and vertebrae: No acute fracture. Cervical vertebral body heights are maintained. Chronic mild T2 and T3 compression deformities, unchanged from 2018 thoracic spine MRI. The dens and skull base are intact. Soft tissues and spinal canal: No prevertebral fluid or swelling. No visible canal  hematoma. Disc levels: Mild multilevel degenerative disc disease with moderate diffuse facet hypertrophy. Upper chest: No acute findings. Right thyroid  nodule was previously assessed on thyroid  ultrasound. Other: None. IMPRESSION: 1. No acute fracture or traumatic subluxation of the cervical spine. 2. Multilevel degenerative disc disease and facet hypertrophy. Electronically Signed   By: Andrea Gasman M.D.   On: 09/17/2023 17:22   CT Head Wo Contrast Result Date: 09/17/2023 CLINICAL DATA:  Trip and fall. EXAM: CT HEAD WITHOUT CONTRAST TECHNIQUE: Contiguous axial images were obtained from the base of the skull through the vertex without intravenous contrast. RADIATION DOSE REDUCTION: This exam was performed according to the departmental dose-optimization program which includes automated exposure control, adjustment of the mA and/or kV according to patient size and/or use of iterative reconstruction technique. COMPARISON:  Head CT 08/05/2016 FINDINGS: Brain: No intracranial hemorrhage, mass effect, or midline shift. Brain volume is normal for age. No hydrocephalus. The basilar cisterns are patent. Periventricular and deep white matter hypodensity, nonspecific but typical of chronic small vessel ischemia. Mild progression from remote exam. No evidence of territorial infarct or acute ischemia. No extra-axial or intracranial fluid collection. Vascular: Atherosclerosis of skullbase vasculature without hyperdense vessel or abnormal calcification. Skull: No fracture or focal lesion. Sinuses/Orbits: No acute finding. Other: No confluent scalp hematoma. IMPRESSION: 1. No acute intracranial abnormality. No skull fracture. 2. Chronic small vessel ischemia. Electronically Signed   By: Andrea Gasman M.D.   On: 09/17/2023 17:17   DG Hip Unilat With Pelvis 2-3 Views Left Result Date: 09/17/2023 CLINICAL DATA:  Trip and fall with bilateral hip pain. EXAM: DG HIP (WITH OR WITHOUT PELVIS) 2-3V LEFT; DG HIP (WITH OR WITHOUT  PELVIS) 2-3V RIGHT COMPARISON:  None Available. FINDINGS: No acute fracture of the pelvis or hips. No hip dislocation. There is bilateral hip joint space narrowing with acetabular spurring. Pubic rami are intact. Pubic symphysis and sacroiliac joints are congruent. No gross disruption of sacral ala. No erosive change. Pelvic phleboliths. IMPRESSION: 1. No acute fracture of the pelvis or hips. 2. Mild bilateral hip osteoarthritis. Electronically Signed   By: Andrea Gasman M.D.   On: 09/17/2023 15:13   DG Hip Unilat  With Pelvis 2-3 Views Right Result Date: 09/17/2023 CLINICAL DATA:  Trip and fall with bilateral hip pain. EXAM: DG HIP (WITH OR WITHOUT PELVIS) 2-3V LEFT; DG HIP (WITH OR WITHOUT PELVIS) 2-3V RIGHT COMPARISON:  None Available. FINDINGS: No acute fracture of the pelvis or hips. No hip dislocation. There is bilateral hip joint space narrowing with acetabular spurring. Pubic rami are intact. Pubic symphysis and sacroiliac joints are congruent. No gross disruption of sacral ala. No erosive change. Pelvic phleboliths. IMPRESSION: 1. No acute fracture of the pelvis or hips. 2. Mild bilateral hip osteoarthritis. Electronically Signed   By: Andrea  Sanford M.D.   On: 09/17/2023 15:13       LOS: 0 days   Mehtab Dolberry Verdene  [facility] Pager on [email or web address]  09/18/2023, 10:13 AM

## 2023-09-18 NOTE — Evaluation (Signed)
 Occupational Therapy Evaluation Patient Details Name: Madison Hernandez MRN: [id number] DOB: 31-Jul-1940 Today's Date: 09/18/2023   History of Present Illness   Madison Hernandez is a 83 y.o. female admitted under observation 09/17/23 after a fall at home. CT lumbar spine -  Comminuted fracture through the anterior and middle columns of L1 vertebra with 25% loss of height centrally. PMH includes HLD, HTN, hypothyroidism, Dm2, anemia. CKD 3b     Clinical Impressions Pt is typically independent in IADL/ADL and mobility, drives. No brace present for today's session. Reinforced back precautions throughout. Pt today was min A for transfer from BSC>bed w/education for safe hand placement and use of RW. Mod A for ADL UB and LB due to pain. Educated verbally, and provided with back handout BLT back precautions. Pt lives alone and does not have external support, demonstrating decreased activity tolerance, decreased ROM, decreased balance and would currently benefit from skilled OT in the acute setting as well as afterwards at rehab of <3 hour daily to maximize safety and independence as well as assist with IADL (cooking/daily cleaning) AND ADL. If pain improved with back brace present, could consider HH - but due to decreased support - rehab is essential currently.      If plan is discharge home, recommend the following:   A little help with walking and/or transfers;A lot of help with bathing/dressing/bathroom;Assistance with cooking/housework;Assist for transportation     Functional Status Assessment   Patient has had a recent decline in their functional status and demonstrates the ability to make significant improvements in function in a reasonable and predictable amount of time.     Equipment Recommendations   BSC/3in1;Other (comment) (RW)     Recommendations for Other Services   PT consult     Precautions/Restrictions   Precautions Precautions: Back Precaution Booklet Issued: Yes  (comment) Recall of Precautions/Restrictions: Intact Required Braces or Orthoses: Spinal Brace Spinal Brace: Lumbar corset;Other (comment) (NOT PRESENT YET, Pt found on the [facility]) Spinal Brace Comments: brace not in room yet Restrictions Weight Bearing Restrictions Per Provider Order: No     Mobility Bed Mobility Overal bed mobility: Needs Assistance Bed Mobility: Rolling, Sit to Sidelying Rolling: Contact guard assist       Sit to sidelying: Contact guard assist General bed mobility comments: no physical assist, educated on log roll technique for back precautions    Transfers Overall transfer level: Needs assistance Equipment used: Rolling walker (2 wheels) Transfers: Bed to chair/wheelchair/BSC, Sit to/from Stand Sit to Stand: Min assist (vc for safe hand placement from [facility])     Step pivot transfers: Contact guard assist     General transfer comment: vc for safe hand placement with transfers, new use of RW for pain management      Balance Overall balance assessment: Needs assistance Sitting-balance support: Single extremity supported, Feet supported Sitting balance-Leahy Scale: Fair Sitting balance - Comments: unchallenged   Standing balance support: Bilateral upper extremity supported, Reliant on assistive device for balance Standing balance-Leahy Scale: Poor                             ADL either performed or assessed with clinical judgement   ADL Overall ADL's : Needs assistance/impaired Eating/Feeding: Modified independent   Grooming: Set up;Sitting;Bed level Grooming Details (indicate cue type and reason): unable to maintain standing currently due to pain Upper Body Bathing: Moderate assistance Upper Body Bathing Details (indicate cue type and reason): for back Lower  Body Bathing: Moderate assistance Lower Body Bathing Details (indicate cue type and reason): knees down Upper Body Dressing : Moderate assistance Upper Body Dressing Details  (indicate cue type and reason): extra gown like robe Lower Body Dressing: Moderate assistance Lower Body Dressing Details (indicate cue type and reason): knees down, donned slippers Toilet Transfer: Minimal assistance;Rolling walker (2 wheels);BSC/3in1 Toilet Transfer Details (indicate cue type and reason): small pivotal steps Toileting- Clothing Manipulation and Hygiene: Contact guard assist;Sitting/lateral lean       Functional mobility during ADLs: Minimal assistance;Rolling walker (2 wheels) (pivotal steps to return to bed) General ADL Comments: decreased access to LB, decreased standing tolerance, educated on back precautions     Vision Baseline Vision/History: 1 Wears glasses Patient Visual Report: No change from baseline Vision Assessment?: No apparent visual deficits     Perception         Praxis         Pertinent Vitals/Pain Pain Assessment Pain Assessment: 0-10 Pain Score: 10-Worst pain ever Pain Location: back and R hip Pain Descriptors / Indicators: Discomfort, Guarding Pain Intervention(s): Limited activity within patient's tolerance, Monitored during session, Repositioned, Patient requesting pain meds-RN notified     Extremity/Trunk Assessment Upper Extremity Assessment Upper Extremity Assessment: Overall WFL for tasks assessed   Lower Extremity Assessment Lower Extremity Assessment: Defer to PT evaluation   Cervical / Trunk Assessment Cervical / Trunk Assessment: Other exceptions Cervical / Trunk Exceptions: new L1 fx, outpatient follow up   Communication Communication Communication: No apparent difficulties   Cognition Arousal: Alert Behavior During Therapy: WFL for tasks assessed/performed Cognition: No apparent impairments                               Following commands: Intact       Cueing  General Comments   Cueing Techniques: Verbal cues  no brace in room, found Pt on the [facility]   Exercises     Shoulder Instructions       Home Living Family/patient expects to be discharged to:: Private residence Living Arrangements: Alone   Type of Home: Apartment Home Access: Level entry     Home Layout: One level     Bathroom Shower/Tub: Chief Strategy Officer: Standard     Home Equipment: None          Prior Functioning/Environment Prior Level of Function : Independent/Modified Independent;Driving;History of Falls (last six months)             Mobility Comments: no DME ADLs Comments: does all IADL and ADL    OT Problem List: Decreased activity tolerance;Impaired balance (sitting and/or standing);Decreased knowledge of use of DME or AE;Decreased knowledge of precautions;Pain   OT Treatment/Interventions: Self-care/ADL training;DME and/or AE instruction;Therapeutic activities;Patient/family education;Balance training      OT Goals(Current goals can be found in the care plan section)   Acute Rehab OT Goals Patient Stated Goal: decrease pain, stay independent OT Goal Formulation: With patient Time For Goal Achievement: 10/02/23 Potential to Achieve Goals: Good ADL Goals Pt Will Perform Grooming: with modified independence;standing Pt Will Perform Upper Body Dressing: with modified independence;sitting Pt Will Perform Lower Body Dressing: with supervision;sit to/from stand Pt Will Transfer to Toilet: with supervision;ambulating Pt Will Perform Toileting - Clothing Manipulation and hygiene: with supervision;sitting/lateral leans;sit to/from stand Additional ADL Goal #1: Pt will verbalize 3/3 back precautions and maintain during ADL routine with no cues   OT Frequency:  Min 2X/week  Co-evaluation              AM-PAC OT 6 Clicks Daily Activity     Outcome Measure Help from another person eating meals?: None Help from another person taking care of personal grooming?: A Little Help from another person toileting, which includes using toliet, bedpan, or urinal?: A  Lot Help from another person bathing (including washing, rinsing, drying)?: A Lot Help from another person to put on and taking off regular upper body clothing?: A Lot Help from another person to put on and taking off regular lower body clothing?: A Lot 6 Click Score: 15   End of Session Equipment Utilized During Treatment: Rolling walker (2 wheels) Nurse Communication: Mobility status;Patient requests pain meds;Precautions  Activity Tolerance: Patient limited by pain;Other (comment) (no brace, returned supine and did not press mobility) Patient left: in bed;with call bell/phone within reach;with bed alarm set;with nursing/sitter in room  OT Visit Diagnosis: Unsteadiness on feet (R26.81);Other abnormalities of gait and mobility (R26.89);History of falling (Z91.81);Pain Pain - Right/Left: Right Pain - part of body: Hip (and back)                Time: 9085-9060 OT Time Calculation (min): 25 min Charges:  OT General Charges $OT Visit: 1 Visit OT Evaluation $OT Eval Moderate Complexity: 1 Mod OT Treatments $Self Care/Home Management : 8-22 mins Leita DEL OTR/L Acute Rehabilitation Services Office: [phone]   Leita PARAS [facility] 09/18/2023, 9:56 AM

## 2023-09-19 DIAGNOSIS — I10 Essential (primary) hypertension: Secondary | ICD-10-CM | POA: Diagnosis not present

## 2023-09-19 DIAGNOSIS — S32018A Other fracture of first lumbar vertebra, initial encounter for closed fracture: Secondary | ICD-10-CM | POA: Diagnosis not present

## 2023-09-19 DIAGNOSIS — E559 Vitamin D deficiency, unspecified: Secondary | ICD-10-CM

## 2023-09-19 LAB — GLUCOSE, CAPILLARY
Glucose-Capillary: 101 mg/dL — ABNORMAL HIGH (ref 70–99)
Glucose-Capillary: 104 mg/dL — ABNORMAL HIGH (ref 70–99)
Glucose-Capillary: 105 mg/dL — ABNORMAL HIGH (ref 70–99)
Glucose-Capillary: 112 mg/dL — ABNORMAL HIGH (ref 70–99)
Glucose-Capillary: 114 mg/dL — ABNORMAL HIGH (ref 70–99)
Glucose-Capillary: 88 mg/dL (ref 70–99)
Glucose-Capillary: 98 mg/dL (ref 70–99)

## 2023-09-19 LAB — BASIC METABOLIC PANEL WITH GFR
Anion gap: 15 (ref 5–15)
BUN: 22 mg/dL (ref 8–23)
CO2: 21 mmol/L — ABNORMAL LOW (ref 22–32)
Calcium: 9.8 mg/dL (ref 8.9–10.3)
Chloride: 99 mmol/L (ref 98–111)
Creatinine, Ser: 1.16 mg/dL — ABNORMAL HIGH (ref 0.44–1.00)
GFR, Estimated: 47 mL/min — ABNORMAL LOW (ref >60)
Glucose, Bld: 109 mg/dL — ABNORMAL HIGH (ref 70–99)
Potassium: 4.2 mmol/L (ref 3.5–5.1)
Sodium: 135 mmol/L (ref 135–145)

## 2023-09-19 LAB — CBC
HCT: 32.6 % — ABNORMAL LOW (ref 36.0–46.0)
Hemoglobin: 10.3 g/dL — ABNORMAL LOW (ref 12.0–15.0)
MCH: 28.5 pg (ref 26.0–34.0)
MCHC: 31.6 g/dL (ref 30.0–36.0)
MCV: 90.1 fL (ref 80.0–100.0)
Platelets: 189 K/uL (ref 150–400)
RBC: 3.62 MIL/uL — ABNORMAL LOW (ref 3.87–5.11)
RDW: 13.7 % (ref 11.5–15.5)
WBC: 9.2 K/uL (ref 4.0–10.5)
nRBC: 0 % (ref 0.0–0.2)

## 2023-09-19 MED ORDER — MECLIZINE HCL 25 MG PO TABS
25.0000 mg | ORAL_TABLET | Freq: Three times a day (TID) | ORAL | Status: DC
  Administered 2023-09-19 – 2023-09-21 (×7): 25 mg via ORAL
  Filled 2023-09-19 (×7): qty 1

## 2023-09-19 MED ORDER — ALPRAZOLAM 0.25 MG PO TABS
0.2500 mg | ORAL_TABLET | Freq: Two times a day (BID) | ORAL | Status: DC | PRN
  Administered 2023-09-19 – 2023-09-20 (×5): 0.25 mg via ORAL
  Filled 2023-09-19 (×5): qty 1

## 2023-09-19 MED ORDER — MELATONIN 5 MG PO TABS
5.0000 mg | ORAL_TABLET | Freq: Once | ORAL | Status: AC
  Administered 2023-09-19: 5 mg via ORAL
  Filled 2023-09-19: qty 1

## 2023-09-19 MED ORDER — AMLODIPINE BESYLATE 5 MG PO TABS
5.0000 mg | ORAL_TABLET | Freq: Every day | ORAL | Status: DC
  Administered 2023-09-19 – 2023-09-21 (×4): 5 mg via ORAL
  Filled 2023-09-19 (×4): qty 1

## 2023-09-19 NOTE — [place] FL2 (Signed)
   MEDICAID FL2 LEVEL OF CARE FORM     IDENTIFICATION  Patient Name: Madison Hernandez Birthdate: May 20, 1940 Sex: female Admission Date (Current Location): 09/17/2023  [facility] and [place] Number:      Facility and Address:  [facility],  [address], [place] [address]      Provider Number: [id number]  Attending Physician Name and Address:  Verdene Purchase, MD  Relative Name and Phone Number:  Jackquline Railing (Sister)  [phone]    Current Level of Care: Hospital Recommended Level of Care: Skilled Nursing Facility Prior Approval Number:    Date Approved/Denied:   PASRR Number: [id number]  Discharge Plan: SNF    Current Diagnoses: Patient Active Problem List   Diagnosis Date Noted   L1 vertebral fracture (HCC) 09/17/2023   Syncope 09/17/2023   AKI (acute kidney injury) (HCC) 09/17/2023   IBS (irritable bowel syndrome) 01/15/2022   Type 2 diabetes mellitus with hyperglycemia (HCC) 04/15/2021   Constipation 01/25/2020   Diverticular disease of colon 01/25/2020   Left lower quadrant pain 01/25/2020   Hypothyroid 02/15/2018   Rosacea 01/07/2017   PVC (premature ventricular contraction) 06/17/2016   Osteoarthritis, multiple sites 11/06/2013   Chronic kidney disease 03/14/2012   Hyperglycemia 02/14/2012   Edema 07/21/2011   Fatigue 01/21/2011   History of depression 08/21/2010   Acute ischemic colitis (HCC) 08/21/2010   Migraine headache 08/21/2010   Abnormal ECG 07/08/2010   HTN (hypertension) 07/08/2010   Mixed hyperlipidemia 07/08/2010   Abnormal ECG 07/08/2010   Palpitations 07/08/2010    Orientation RESPIRATION BLADDER Height & Weight     Self, Time, Situation, Place  Normal Continent Weight: 147 lb 4.3 oz (66.8 kg) Height:  5' 4 (162.6 cm)  BEHAVIORAL SYMPTOMS/MOOD NEUROLOGICAL BOWEL NUTRITION STATUS      Continent Diet (Carb Modified)  AMBULATORY STATUS COMMUNICATION OF NEEDS Skin   Limited Assist Verbally Normal                        Personal Care Assistance Level of Assistance  Bathing, Feeding, Dressing Bathing Assistance: Limited assistance Feeding assistance: Independent Dressing Assistance: Limited assistance     Functional Limitations Info  Sight, Hearing, Speech Sight Info: Adequate Hearing Info: Impaired (hard-of-hearing) Speech Info: Adequate    SPECIAL CARE FACTORS FREQUENCY  PT (By licensed PT), OT (By licensed OT)     PT Frequency: 5x per week OT Frequency: 5x per week            Contractures Contractures Info: Not present    Additional Factors Info  Code Status, Allergies Code Status Info: FULL Allergies Info: Avelox (Moxifloxacin Hcl In Nacl), Mucinex (Guaifenesin Er), Other, Sulfa  Antibiotics, Albuterol , Cefdinir           Current Medications (09/19/2023):  This is the current hospital active medication list Current Facility-Administered Medications  Medication Dose Route Frequency Provider Last Rate Last Admin   acetaminophen  (TYLENOL ) tablet 650 mg  650 mg Oral Q6H PRN Doutova, Anastassia, MD       Or   acetaminophen  (TYLENOL ) suppository 650 mg  650 mg Rectal Q6H PRN Doutova, Anastassia, MD       amLODipine  (NORVASC ) tablet 5 mg  5 mg Oral Daily Krishnan, Gokul, MD   5 mg at 09/19/23 1044   enoxaparin  (LOVENOX ) injection 30 mg  30 mg Subcutaneous Daily Krishnan, Gokul, MD   30 mg at 09/19/23 0826   gabapentin  (NEURONTIN ) capsule 300 mg  300 mg Oral QHS Krishnan, Gokul,  MD   300 mg at 09/18/23 2106   HYDROcodone -acetaminophen  (NORCO/VICODIN) 5-325 MG per tablet 1-2 tablet  1-2 tablet Oral Q4H PRN Doutova, Anastassia, MD   2 tablet at 09/18/23 1750   HYDROmorphone  (DILAUDID ) injection 0.5-1 mg  0.5-1 mg Intravenous Q3H PRN Daniels, James K, NP   1 mg at 09/19/23 1201   insulin  aspart (novoLOG ) injection 0-9 Units  0-9 Units Subcutaneous Q4H Doutova, Anastassia, MD       levothyroxine  (SYNTHROID ) tablet 25 mcg  25 mcg Oral QAC breakfast Doutova, Anastassia, MD   25  mcg at 09/19/23 9471   linaclotide  (LINZESS ) capsule 145 mcg  145 mcg Oral QAC breakfast Doutova, Anastassia, MD   145 mcg at 09/18/23 9065   meclizine  (ANTIVERT ) tablet 25 mg  25 mg Oral TID Krishnan, Gokul, MD   25 mg at 09/19/23 1043   ondansetron  (ZOFRAN ) tablet 4 mg  4 mg Oral Q6H PRN Doutova, Anastassia, MD   4 mg at 09/19/23 0350   Or   ondansetron  (ZOFRAN ) injection 4 mg  4 mg Intravenous Q6H PRN Doutova, Anastassia, MD   4 mg at 09/18/23 2106   Oral care mouth rinse  15 mL Mouth Rinse PRN Krishnan, Gokul, MD       prochlorperazine  (COMPAZINE ) injection 10 mg  10 mg Intravenous Q6H PRN Krishnan, Gokul, MD   10 mg at 09/18/23 2326   rosuvastatin  (CRESTOR ) tablet 40 mg  40 mg Oral Daily Doutova, Anastassia, MD   40 mg at 09/19/23 0824   Vitamin D  (Ergocalciferol ) (DRISDOL ) 1.25 MG (50000 UNIT) capsule 50,000 Units  50,000 Units Oral Q7 days Verdene Purchase, MD   50,000 Units at 09/18/23 1205     Discharge Medications: Please see discharge summary for a list of discharge medications.  Relevant Imaging Results:  Relevant Lab Results:   Additional Information SSN: [id number]  Heather LABOR Halaina Vanduzer, LCSW

## 2023-09-19 NOTE — Plan of Care (Signed)
  Problem: Education: Goal: Ability to describe self-care measures that may prevent or decrease complications (Diabetes Survival Skills Education) will improve Outcome: Progressing Goal: Individualized Educational Video(s) Outcome: Progressing   Problem: Coping: Goal: Ability to adjust to condition or change in health will improve Outcome: Progressing   Problem: Fluid Volume: Goal: Ability to maintain a balanced intake and output will improve Outcome: Progressing   Problem: Health Behavior/Discharge Planning: Goal: Ability to identify and utilize available resources and services will improve Outcome: Progressing Goal: Ability to manage health-related needs will improve Outcome: Progressing   Problem: Metabolic: Goal: Ability to maintain appropriate glucose levels will improve Outcome: Progressing   Problem: Nutritional: Goal: Maintenance of adequate nutrition will improve Outcome: Progressing Goal: Progress toward achieving an optimal weight will improve Outcome: Progressing   Problem: Skin Integrity: Goal: Risk for impaired skin integrity will decrease Outcome: Progressing   Problem: Tissue Perfusion: Goal: Adequacy of tissue perfusion will improve Outcome: Progressing   Problem: Education: Goal: Knowledge of General Education information will improve Description: Including pain rating scale, medication(s)/side effects and non-pharmacologic comfort measures Outcome: Progressing   Problem: Health Behavior/Discharge Planning: Goal: Ability to manage health-related needs will improve Outcome: Progressing   Problem: Activity: Goal: Risk for activity intolerance will decrease Outcome: Progressing   Problem: Nutrition: Goal: Adequate nutrition will be maintained Outcome: Progressing   Problem: Elimination: Goal: Will not experience complications related to bowel motility Outcome: Progressing Goal: Will not experience complications related to urinary  retention Outcome: Progressing   Problem: Pain Managment: Goal: General experience of comfort will improve and/or be controlled Outcome: Progressing   Problem: Safety: Goal: Ability to remain free from injury will improve Outcome: Progressing   Problem: Skin Integrity: Goal: Risk for impaired skin integrity will decrease Outcome: Progressing

## 2023-09-19 NOTE — Progress Notes (Signed)
 Mobility Specialist - Progress Note   09/19/23 1036  Mobility  Activity Ambulated with assistance  Level of Assistance Contact guard assist, steadying assist  Assistive Device Front wheel walker  Distance Ambulated (ft) 24 ft  Range of Motion/Exercises Active  Activity Response Tolerated well  Mobility Referral Yes  Mobility visit 1 Mobility  Mobility Specialist Start Time (ACUTE ONLY) 1020  Mobility Specialist Stop Time (ACUTE ONLY) 1036  Mobility Specialist Time Calculation (min) (ACUTE ONLY) 16 min   Pt was found in bed and agreeable to mobilize in room. Educated pt on log roll technique to to BLT precautions. Assisted on donning brace. At EOS was left on recliner chair with all needs met. Call bell in reach and chair alarm on. RN notified.   Erminio Leos,  Mobility Specialist Can be reached via Secure Chat

## 2023-09-19 NOTE — Plan of Care (Signed)

## 2023-09-19 NOTE — Progress Notes (Signed)
 [facility] PROGRESS NOTE   TASHI ANDUJO FMW:[id number] DOB: 07-07-1940 DOA: 09/17/2023  PCP: Micheal Wolm ORN, MD  Brief History: 83 y.o. female with medical history significant of HLD, HTN, hypothyroidism, Dm2, anemia, CKD 3b who presented after sustaining a fall at home.  There was concern that she may have had a syncopal episode.  Patient was noted to be dehydrated.  Found to have L1 compression fracture.  Was hospitalized for further management.  Consultants: Phone discussion with neurosurgery by EDP  Procedures: None    Subjective/Interval History: Patient complains of dizziness which she describes as if the room is spinning around her.  She has had vertigo previously.  Has chronic ear problems but has not seen ENT recently.  Denies any chest pain shortness of breath.  No weakness on any 1 side of the body.    Assessment/Plan:  Syncope Possibly in the setting of dehydration and AKI.  Etiology unclear. No arrhythmias identified.  Echocardiogram shows normal LVEF. Orthostatics have not been checked yet.  Vertigo Describes vertiginous symptoms.  No focal neurological deficits noted.  CT head done at the time of admission did not show any acute findings. She has had vertigo previously. Will order meclizine .  PT OT.  Acute kidney injury on chronic kidney disease stage IIIb Baseline creatinine seems to be around 1.6.  Came in with a creatinine of around 2.  Improved with hydration.    L1 compression fracture Case was discussed with neurosurgery by EDP.  They recommended LSO brace and outpatient follow-up.  No role for any procedures in the hospital at this time.  Patient does not have any neurological deficits.  PT OT eval.  Pain control.  SNF is recommended for short-term rehab.  Essential hypertension Holding ARB due to AKI.  Elevated blood pressure readings noted.  Will initiate amlodipine .  Hypothyroidism Continue levothyroxine .  Diabetes mellitus type  2 CBGs to be monitored.  SSI.  Normocytic anemia Hemoglobin is stable.  No evidence of overt bleeding.  Leukocytosis Most likely reactive.  UA was unremarkable.  She was afebrile.  Now resolved.  Vitamin D  deficiency Started vitamin D  supplementation  DVT Prophylaxis: Lovenox  Code Status: Full code Family Communication: Discussed with patient Disposition Plan: SNF for short-term rehab was recommended.  Will consult TOC.     Medications: Scheduled:  enoxaparin  (LOVENOX ) injection  30 mg Subcutaneous Daily   gabapentin   300 mg Oral QHS   insulin  aspart  0-9 Units Subcutaneous Q4H   levothyroxine   25 mcg Oral QAC breakfast   linaclotide   145 mcg Oral QAC breakfast   meclizine   25 mg Oral TID   rosuvastatin   40 mg Oral Daily   Vitamin D  (Ergocalciferol )  50,000 Units Oral Q7 days   Continuous:  sodium chloride  50 mL/hr at 09/19/23 0443   PRN:acetaminophen  **OR** acetaminophen , HYDROcodone -acetaminophen , HYDROmorphone  (DILAUDID ) injection, ondansetron  **OR** ondansetron  (ZOFRAN ) IV, mouth rinse, prochlorperazine   Objective:  Vital Signs  Vitals:   09/18/23 1347 09/18/23 2031 09/18/23 2055 09/19/23 0425  BP: 135/68 (!) 141/79 (!) 166/75 (!) 158/77  Pulse: 96 87 (!) 108 78  Resp: 18 14 20 14   Temp: 97.8 F (36.6 C) 97.7 F (36.5 C) 98.2 F (36.8 C) 97.9 F (36.6 C)  TempSrc: Oral Oral  Oral  SpO2: 96% 97% 98% 96%  Weight:      Height:        Intake/Output Summary (Last 24 hours) at 09/19/2023 0958 Last data filed at 09/19/2023 0215 Gross per 24 hour  Intake 1336.67 ml  Output 200 ml  Net 1136.67 ml   Filed Weights   09/17/23 2317  Weight: 66.8 kg    General appearance: Awake alert.  In no distress Resp: Clear to auscultation bilaterally.  Normal effort Cardio: S1-S2 is normal regular.  No S3-S4.  No rubs murmurs or bruit GI: Abdomen is soft.  Nontender nondistended.  Bowel sounds are present normal.  No masses organomegaly Extremities: No edema.  Full range  of motion of lower extremities. Neurologic: Alert and oriented x3.  Cranial nerves II to XII intact.  Motor strength equal bilateral upper and lower extremities.  No pronator drift.  No dysdiadochokinesis.  No nystagmus.  Lab Results:  Data Reviewed: I have personally reviewed following labs and reports of the imaging studies  CBC: Recent Labs  Lab 09/17/23 1557 09/18/23 0644 09/19/23 0553  WBC 19.8* 12.2* 9.2  NEUTROABS 17.2*  --   --   HGB 10.9* 10.7* 10.3*  HCT 34.3* 34.7* 32.6*  MCV 89.3 90.8 90.1  PLT 312 257 189    Basic Metabolic Panel: Recent Labs  Lab 09/17/23 1557 09/18/23 0026 09/18/23 0644 09/19/23 0553  NA 133* 135 136 135  K 4.0 4.3 4.4 4.2  CL 97* 100 99 99  CO2 22 22 23  21*  GLUCOSE 122* 116* 91 109*  BUN 39* 36* 33* 22  CREATININE 2.15* 1.70* 1.69* 1.16*  CALCIUM  10.0 9.4 9.6 9.8  MG  --  2.0 2.1  --   PHOS  --  3.1 3.0  --     GFR: Estimated Creatinine Clearance: 35.7 mL/min (A) (by C-G formula based on SCr of 1.16 mg/dL (H)).  Liver Function Tests: Recent Labs  Lab 09/17/23 1557 09/18/23 0644  AST 26 28  ALT 20 20  ALKPHOS 135* 120  BILITOT 0.3 0.5  PROT 6.5 6.3*  ALBUMIN 4.1 4.0    Cardiac Enzymes: Recent Labs  Lab 09/18/23 0026  CKTOTAL 104   HbA1C: Recent Labs    09/18/23 0026  HGBA1C 6.6*    CBG: Recent Labs  Lab 09/18/23 0421 09/18/23 0756 09/18/23 1123 09/18/23 1619 09/19/23 0800  GLUCAP 105* 93 106* 108* 105*    Thyroid  Function Tests: Recent Labs    09/18/23 0026  TSH 2.600    Anemia Panel: Recent Labs    09/18/23 0026 09/18/23 0644  VITAMINB12  --  500  FOLATE  --  14.5  FERRITIN 312*  --   TIBC 315  --   IRON 18*  --   RETICCTPCT 1.5  --     Radiology Studies: ECHOCARDIOGRAM COMPLETE Result Date: 09/18/2023    ECHOCARDIOGRAM REPORT   Patient Name:   LANELLE LINDO Date of Exam: 09/18/2023 Medical Rec #:  [id number]       Height:       64.0 in Accession #:    [id number]      Weight:        147.3 lb Date of Birth:  [birth year]       BSA:          1.718 m Patient Age:    83 years        BP:           121/69 mmHg Patient Gender: F               HR:           59 bpm. Exam Location:  Inpatient Procedure: 2D Echo, Cardiac Doppler and Color  Doppler (Both Spectral and Color            Flow Doppler were utilized during procedure). Indications:    Syncope  History:        Patient has prior history of Echocardiogram examinations, most                 recent 06/24/2016. Risk Factors:Hypertension, Dyslipidemia and                 Diabetes.  Sonographer:    Therisa Crouch Referring Phys: [id number] ANASTASSIA DOUTOVA IMPRESSIONS  1. Left ventricular ejection fraction, by estimation, is 60 to 65%. The left ventricle has normal function. The left ventricle has no regional wall motion abnormalities. There is mild concentric left ventricular hypertrophy. Left ventricular diastolic parameters are indeterminate.  2. Right ventricular systolic function is normal. The right ventricular size is normal.  3. The mitral valve is normal in structure. No evidence of mitral valve regurgitation. No evidence of mitral stenosis.  4. Tricuspid valve regurgitation is mild to moderate.  5. The aortic valve is normal in structure. Aortic valve regurgitation is trivial. No aortic stenosis is present.  6. The inferior vena cava is normal in size with greater than 50% respiratory variability, suggesting right atrial pressure of 3 mmHg. FINDINGS  Left Ventricle: Left ventricular ejection fraction, by estimation, is 60 to 65%. The left ventricle has normal function. The left ventricle has no regional wall motion abnormalities. The left ventricular internal cavity size was normal in size. There is  mild concentric left ventricular hypertrophy. Left ventricular diastolic parameters are indeterminate. Right Ventricle: The right ventricular size is normal. No increase in right ventricular wall thickness. Right ventricular systolic function is normal. Left  Atrium: Left atrial size was normal in size. Right Atrium: Right atrial size was normal in size. Pericardium: There is no evidence of pericardial effusion. Presence of epicardial fat layer. Mitral Valve: The mitral valve is normal in structure. No evidence of mitral valve regurgitation. No evidence of mitral valve stenosis. Tricuspid Valve: The tricuspid valve is normal in structure. Tricuspid valve regurgitation is mild to moderate. No evidence of tricuspid stenosis. Aortic Valve: The aortic valve is normal in structure. Aortic valve regurgitation is trivial. No aortic stenosis is present. Aortic valve mean gradient measures 4.0 mmHg. Aortic valve peak gradient measures 8.4 mmHg. Aortic valve area, by VTI measures 3.39 cm. Pulmonic Valve: The pulmonic valve was normal in structure. Pulmonic valve regurgitation is trivial. No evidence of pulmonic stenosis. Aorta: The aortic root is normal in size and structure. Venous: The inferior vena cava is normal in size with greater than 50% respiratory variability, suggesting right atrial pressure of 3 mmHg. IAS/Shunts: No atrial level shunt detected by color flow Doppler.  LEFT VENTRICLE PLAX 2D LVIDd:         4.10 cm   Diastology LVIDs:         2.50 cm   LV e' medial:    5.98 cm/s LV PW:         1.20 cm   LV E/e' medial:  8.9 LV IVS:        1.10 cm   LV e' lateral:   9.25 cm/s LVOT diam:     2.10 cm   LV E/e' lateral: 5.7 LV SV:         81 LV SV Index:   47 LVOT Area:     3.46 cm  RIGHT VENTRICLE  IVC RV Basal diam:  3.20 cm     IVC diam: 1.30 cm RV S prime:     16.80 cm/s TAPSE (M-mode): 2.4 cm LEFT ATRIUM             Index        RIGHT ATRIUM           Index LA diam:        3.40 cm 1.98 cm/m   RA Area:     15.60 cm LA Vol (A2C):   68.9 ml 40.11 ml/m  RA Volume:   36.60 ml  21.31 ml/m LA Vol (A4C):   32.3 ml 18.80 ml/m LA Biplane Vol: 49.9 ml 29.05 ml/m  AORTIC VALVE AV Area (Vmax):    2.68 cm AV Area (Vmean):   2.89 cm AV Area (VTI):     3.39 cm AV  Vmax:           145.00 cm/s AV Vmean:          92.300 cm/s AV VTI:            0.239 m AV Peak Grad:      8.4 mmHg AV Mean Grad:      4.0 mmHg LVOT Vmax:         112.00 cm/s LVOT Vmean:        77.100 cm/s LVOT VTI:          0.234 m LVOT/AV VTI ratio: 0.98  AORTA Ao Asc diam: 2.90 cm MITRAL VALVE               TRICUSPID VALVE MV Area (PHT): 2.50 cm    TR Peak grad:   25.8 mmHg MV E velocity: 53.10 cm/s  TR Vmax:        254.00 cm/s MV A velocity: 78.80 cm/s MV E/A ratio:  0.67        SHUNTS                            Systemic VTI:  0.23 m                            Systemic Diam: 2.10 cm Kardie Tobb DO Electronically signed by Dub Huntsman DO Signature Date/Time: 09/18/2023/4:27:10 PM    Final    CT Lumbar Spine Wo Contrast Result Date: 09/17/2023 CLINICAL DATA:  Lumbar radiculopathy.  Trip and fall.  Back pain. EXAM: CT LUMBAR SPINE WITHOUT CONTRAST TECHNIQUE: Multidetector CT imaging of the lumbar spine was performed without intravenous contrast administration. Multiplanar CT image reconstructions were also generated. RADIATION DOSE REDUCTION: This exam was performed according to the departmental dose-optimization program which includes automated exposure control, adjustment of the mA and/or kV according to patient size and/or use of iterative reconstruction technique. COMPARISON:  None Available. FINDINGS: Segmentation: 5 lumbar type vertebrae. Alignment: Normal. Vertebrae: Comminuted fracture through the anterior middle columns of L1 vertebra. There is mild displacement of the superior endplate. 25% loss of height centrally. There is slight buckling of the posterior cortex. No extension into the lamina or pedicles. Prominent Schmorl's node involving superior endplate of T12 is chronic when compared to prior thoracic spine MRI. The bones are subjectively under mineralized. No additional acute fracture. The included sacrum is intact. Paraspinal and other soft tissues: Minimal L1 perispinal soft tissue thickening at  site of fracture. Aortic atherosclerosis. Colonic diverticulosis. Disc levels: Moderate degenerative disc disease at  L2-L3 and L5-S1. Multilevel facet hypertrophy. IMPRESSION: 1. Comminuted fracture through the anterior and middle columns of L1 vertebra with 25% loss of height centrally. Slight buckling of the posterior cortex. No extension into the lamina or pedicles. 2. Moderate degenerative disc disease at L2-L3 and L5-S1. Multilevel facet hypertrophy. Aortic Atherosclerosis (ICD10-I70.0). Electronically Signed   By: Andrea Gasman M.D.   On: 09/17/2023 17:27   CT Cervical Spine Wo Contrast Result Date: 09/17/2023 CLINICAL DATA:  Neck trauma (Age >= 65y) Trip and fall. EXAM: CT CERVICAL SPINE WITHOUT CONTRAST TECHNIQUE: Multidetector CT imaging of the cervical spine was performed without intravenous contrast. Multiplanar CT image reconstructions were also generated. RADIATION DOSE REDUCTION: This exam was performed according to the departmental dose-optimization program which includes automated exposure control, adjustment of the mA and/or kV according to patient size and/or use of iterative reconstruction technique. COMPARISON:  None Available. FINDINGS: Alignment: No traumatic subluxation. Trace anterolisthesis of C3 on C4 and C5 on C6 likely due to facet hypertrophy. Skull base and vertebrae: No acute fracture. Cervical vertebral body heights are maintained. Chronic mild T2 and T3 compression deformities, unchanged from 2018 thoracic spine MRI. The dens and skull base are intact. Soft tissues and spinal canal: No prevertebral fluid or swelling. No visible canal hematoma. Disc levels: Mild multilevel degenerative disc disease with moderate diffuse facet hypertrophy. Upper chest: No acute findings. Right thyroid  nodule was previously assessed on thyroid  ultrasound. Other: None. IMPRESSION: 1. No acute fracture or traumatic subluxation of the cervical spine. 2. Multilevel degenerative disc disease and facet  hypertrophy. Electronically Signed   By: Andrea Gasman M.D.   On: 09/17/2023 17:22   CT Head Wo Contrast Result Date: 09/17/2023 CLINICAL DATA:  Trip and fall. EXAM: CT HEAD WITHOUT CONTRAST TECHNIQUE: Contiguous axial images were obtained from the base of the skull through the vertex without intravenous contrast. RADIATION DOSE REDUCTION: This exam was performed according to the departmental dose-optimization program which includes automated exposure control, adjustment of the mA and/or kV according to patient size and/or use of iterative reconstruction technique. COMPARISON:  Head CT 08/05/2016 FINDINGS: Brain: No intracranial hemorrhage, mass effect, or midline shift. Brain volume is normal for age. No hydrocephalus. The basilar cisterns are patent. Periventricular and deep white matter hypodensity, nonspecific but typical of chronic small vessel ischemia. Mild progression from remote exam. No evidence of territorial infarct or acute ischemia. No extra-axial or intracranial fluid collection. Vascular: Atherosclerosis of skullbase vasculature without hyperdense vessel or abnormal calcification. Skull: No fracture or focal lesion. Sinuses/Orbits: No acute finding. Other: No confluent scalp hematoma. IMPRESSION: 1. No acute intracranial abnormality. No skull fracture. 2. Chronic small vessel ischemia. Electronically Signed   By: Andrea Gasman M.D.   On: 09/17/2023 17:17   DG Hip Unilat With Pelvis 2-3 Views Left Result Date: 09/17/2023 CLINICAL DATA:  Trip and fall with bilateral hip pain. EXAM: DG HIP (WITH OR WITHOUT PELVIS) 2-3V LEFT; DG HIP (WITH OR WITHOUT PELVIS) 2-3V RIGHT COMPARISON:  None Available. FINDINGS: No acute fracture of the pelvis or hips. No hip dislocation. There is bilateral hip joint space narrowing with acetabular spurring. Pubic rami are intact. Pubic symphysis and sacroiliac joints are congruent. No gross disruption of sacral ala. No erosive change. Pelvic phleboliths.  IMPRESSION: 1. No acute fracture of the pelvis or hips. 2. Mild bilateral hip osteoarthritis. Electronically Signed   By: Andrea Gasman M.D.   On: 09/17/2023 15:13   DG Hip Unilat  With Pelvis 2-3 Views Right Result Date:  09/17/2023 CLINICAL DATA:  Trip and fall with bilateral hip pain. EXAM: DG HIP (WITH OR WITHOUT PELVIS) 2-3V LEFT; DG HIP (WITH OR WITHOUT PELVIS) 2-3V RIGHT COMPARISON:  None Available. FINDINGS: No acute fracture of the pelvis or hips. No hip dislocation. There is bilateral hip joint space narrowing with acetabular spurring. Pubic rami are intact. Pubic symphysis and sacroiliac joints are congruent. No gross disruption of sacral ala. No erosive change. Pelvic phleboliths. IMPRESSION: 1. No acute fracture of the pelvis or hips. 2. Mild bilateral hip osteoarthritis. Electronically Signed   By: Andrea Gasman M.D.   On: 09/17/2023 15:13       LOS: 1 day   Fatisha Rabalais  [facility] Pager on [email or web address]  09/19/2023, 9:58 AM

## 2023-09-19 NOTE — TOC Initial Note (Signed)
 Transition of Care [facility]) - Initial/Assessment Note    Patient Details  Name: Madison Hernandez MRN: [id number] Date of Birth: [birth year]  Transition [facility] Care [facility]) CM/SW Contact:    Heather DELENA Saltness, LCSW Phone Number: 09/19/2023, 1:07 PM  Clinical Narrative:                 CSW met with pt at bedside to discuss PT's recommendation for SNF rehab and discharge planning. CSW advised pt of PT's recommendation for short-term SNF rehab upon discharge. Pt is agreeable to SNF placement. Pt denies any SNF location preference. CSW completed FL2 and sent referrals to SNF in [place] and surrounding areas. TOC will continue to follow.    Expected Discharge Plan: Skilled Nursing Facility Barriers to Discharge: Continued Medical Work up, SNF Pending bed offer   Patient Goals and CMS Choice Patient states their goals for this hospitalization and ongoing recovery are:: To go to SNF for rehab CMS Medicare.gov Compare Post Acute Care list provided to:: Patient Choice offered to / list presented to : Patient [place] ownership interest in [facility].provided to:: Patient    Expected Discharge Plan and Services In-house Referral: Clinical Social Work Discharge Planning Services: NA Post Acute Care Choice: Skilled Nursing Facility Living arrangements for the past 2 months: Apartment                 DME Arranged: N/A DME Agency: NA       HH Arranged: NA HH Agency: NA        Prior Living Arrangements/Services Living arrangements for the past 2 months: Apartment Lives with:: Self Patient language and need for interpreter reviewed:: Yes Do you feel safe going back to the place where you live?: Yes      Need for Family Participation in Patient Care: No (Comment) Care giver support system in place?: Yes (comment)   Criminal Activity/Legal Involvement Pertinent to Current Situation/Hospitalization: No - Comment as needed  Activities of Daily Living   ADL Screening (condition at  time of admission) Independently performs ADLs?: Yes (appropriate for developmental age) Is the patient deaf or have difficulty hearing?: Yes Does the patient have difficulty seeing, even when wearing glasses/contacts?: No Does the patient have difficulty concentrating, remembering, or making decisions?: No  Permission Sought/Granted Permission sought to share information with : Oceanographer granted to share information with : Yes, Verbal Permission Granted     Permission granted to share info w AGENCY: SNF  Emotional Assessment Appearance:: Appears stated age, Well-Groomed Attitude/Demeanor/Rapport: Engaged Affect (typically observed): Pleasant, Appropriate, Adaptable, Hopeful, Stable Orientation: : Oriented to Self, Oriented to Place, Oriented to  Time, Oriented to Situation Alcohol / Substance Use: Not Applicable Psych Involvement: No (comment)  Admission diagnosis:  AKI (acute kidney injury) (HCC) [N17.9] L1 vertebral fracture (HCC) [S32.019A] Fall, initial encounter [W19.XXXA] Closed fracture of first lumbar vertebra, unspecified fracture morphology, initial encounter (HCC) [S32.019A] Syncope [R55] Patient Active Problem List   Diagnosis Date Noted   L1 vertebral fracture (HCC) 09/17/2023   Syncope 09/17/2023   AKI (acute kidney injury) (HCC) 09/17/2023   IBS (irritable bowel syndrome) 01/15/2022   Type 2 diabetes mellitus with hyperglycemia (HCC) 04/15/2021   Constipation 01/25/2020   Diverticular disease of colon 01/25/2020   Left lower quadrant pain 01/25/2020   Hypothyroid 02/15/2018   Rosacea 01/07/2017   PVC (premature ventricular contraction) 06/17/2016   Osteoarthritis, multiple sites 11/06/2013   Chronic kidney disease 03/14/2012   Hyperglycemia 02/14/2012   Edema  07/21/2011   Fatigue 01/21/2011   History of depression 08/21/2010   Acute ischemic colitis (HCC) 08/21/2010   Migraine headache 08/21/2010   Abnormal ECG 07/08/2010    HTN (hypertension) 07/08/2010   Mixed hyperlipidemia 07/08/2010   Abnormal ECG 07/08/2010   Palpitations 07/08/2010   PCP:  Micheal Wolm ORN, MD Pharmacy:   [facility] [id number] [place], [place] - [address] [place] [address] Phone: [phone] Fax: [phone]  [facility]) - [place], [place] - [address] [facility] [address] [place] [address] [place] [address] Phone: [phone] Fax: [phone]  [facility] - [place], [place] - [address] [place] [address] Phone: [phone] Fax: [phone]     Social Drivers of Health (SDOH) Social History: SDOH Screenings   Food Insecurity: No Food Insecurity (09/17/2023)  Housing: Low Risk  (09/17/2023)  Transportation Needs: No Transportation Needs (09/17/2023)  Utilities: Not At Risk (09/17/2023)  Depression (PHQ2-9): Low Risk  (07/08/2023)  Social Connections: Socially Isolated (09/17/2023)  Tobacco Use: Medium Risk (09/17/2023)   SDOH Interventions: None indicated     Readmission Risk Interventions    09/19/2023    1:06 PM  Readmission Risk Prevention Plan  Post Dischage Appt Complete  Medication Screening Complete  Transportation Screening Complete    Signed: Heather Saltness, MSW, LCSW Clinical Social Worker Inpatient Care Management 09/19/2023 1:07 PM

## 2023-09-20 DIAGNOSIS — S32018A Other fracture of first lumbar vertebra, initial encounter for closed fracture: Secondary | ICD-10-CM | POA: Diagnosis not present

## 2023-09-20 LAB — GLUCOSE, CAPILLARY
Glucose-Capillary: 108 mg/dL — ABNORMAL HIGH (ref 70–99)
Glucose-Capillary: 137 mg/dL — ABNORMAL HIGH (ref 70–99)
Glucose-Capillary: 144 mg/dL — ABNORMAL HIGH (ref 70–99)
Glucose-Capillary: 148 mg/dL — ABNORMAL HIGH (ref 70–99)
Glucose-Capillary: 80 mg/dL (ref 70–99)
Glucose-Capillary: 94 mg/dL (ref 70–99)
Glucose-Capillary: 94 mg/dL (ref 70–99)
Glucose-Capillary: 95 mg/dL (ref 70–99)

## 2023-09-20 MED ORDER — ACETAMINOPHEN 500 MG PO TABS
500.0000 mg | ORAL_TABLET | Freq: Three times a day (TID) | ORAL | Status: AC
Start: 2023-09-20 — End: 2023-09-23

## 2023-09-20 MED ORDER — ACETAMINOPHEN 500 MG PO TABS
500.0000 mg | ORAL_TABLET | Freq: Three times a day (TID) | ORAL | Status: DC
  Administered 2023-09-20 – 2023-09-21 (×4): 500 mg via ORAL
  Filled 2023-09-20 (×4): qty 1

## 2023-09-20 MED ORDER — ENSURE PLUS HIGH PROTEIN PO LIQD: 237.0000 mL | Freq: Two times a day (BID) | ORAL | Status: AC

## 2023-09-20 MED ORDER — POLYETHYLENE GLYCOL 3350 17 G PO PACK
17.0000 g | PACK | Freq: Every day | ORAL | Status: DC | PRN
  Administered 2023-09-20: 17 g via ORAL
  Filled 2023-09-20: qty 1

## 2023-09-20 MED ORDER — ENSURE PLUS HIGH PROTEIN PO LIQD
237.0000 mL | Freq: Two times a day (BID) | ORAL | Status: DC
  Administered 2023-09-20 (×2): 237 mL via ORAL

## 2023-09-20 MED ORDER — VITAMIN D (ERGOCALCIFEROL) 1.25 MG (50000 UNIT) PO CAPS: 50000.0000 [IU] | ORAL_CAPSULE | ORAL | Status: AC

## 2023-09-20 MED ORDER — ALPRAZOLAM 0.25 MG PO TABS: 0.2500 mg | ORAL_TABLET | Freq: Two times a day (BID) | ORAL | 0 refills | Status: AC | PRN

## 2023-09-20 MED ORDER — MECLIZINE HCL 25 MG PO TABS: 25.0000 mg | ORAL_TABLET | Freq: Three times a day (TID) | ORAL | Status: AC

## 2023-09-20 MED ORDER — POLYETHYLENE GLYCOL 3350 17 G PO PACK: 17.0000 g | PACK | Freq: Every day | ORAL | Status: AC | PRN

## 2023-09-20 MED ORDER — HYDROCODONE-ACETAMINOPHEN 5-325 MG PO TABS: 1.0000 | ORAL_TABLET | ORAL | 0 refills | Status: DC | PRN

## 2023-09-20 MED ORDER — HYDROCODONE-ACETAMINOPHEN 5-325 MG PO TABS: 1.0000 | ORAL_TABLET | ORAL | 0 refills | Status: AC | PRN

## 2023-09-20 MED ORDER — METHOCARBAMOL 500 MG PO TABS
500.0000 mg | ORAL_TABLET | Freq: Three times a day (TID) | ORAL | Status: DC | PRN
  Administered 2023-09-20: 500 mg via ORAL
  Filled 2023-09-20: qty 1

## 2023-09-20 MED ORDER — ALPRAZOLAM 0.25 MG PO TABS: 0.2500 mg | ORAL_TABLET | Freq: Two times a day (BID) | ORAL | 0 refills | Status: DC | PRN

## 2023-09-20 MED ORDER — HYDROMORPHONE HCL 1 MG/ML IJ SOLN
0.5000 mg | INTRAMUSCULAR | Status: DC | PRN
  Administered 2023-09-20 – 2023-09-21 (×3): 0.5 mg via INTRAVENOUS
  Filled 2023-09-20 (×3): qty 0.5

## 2023-09-20 MED ORDER — METHOCARBAMOL 500 MG PO TABS: 500.0000 mg | ORAL_TABLET | Freq: Three times a day (TID) | ORAL | Status: AC | PRN

## 2023-09-20 NOTE — Progress Notes (Signed)
 Occupational Therapy Treatment Patient Details Name: Madison Hernandez MRN: [id number] DOB: Jul 04, 1940 Today's Date: 09/20/2023   History of present illness Madison Hernandez is a 83 yr old female brought to the hospital after a fall at home. CT lumbar spine -  Comminuted fracture through the anterior and middle columns of L1 vertebra with 25% loss of height centrally. PMH includes HLD, HTN, hypothyroidism, DM 2, anemia. CKD 3b   OT comments  The pt was seen for ADL instruction and progression of functional activity. She required max assist to donn her back brace seated EOB, CGA to stand from an elevated bed using a RW, and min assist for toileting management at bathroom level. She required reinforcement on recalling and adhering to spinal precautions during progressive activity. She reported increased low back pain with progressive activity and sitting. She was instructed on improved body positioning to decrease pain. Continue OT plan of care. Patient will benefit from continued inpatient follow up therapy, <3 hours/day.       If plan is discharge home, recommend the following:  A little help with walking and/or transfers;A lot of help with bathing/dressing/bathroom;Assistance with cooking/housework;Assist for transportation   Equipment Recommendations  BSC/3in1;Other (comment) (Rolling walker)    Recommendations for Other Services      Precautions / Restrictions Precautions Precautions: Back Required Braces or Orthoses: Spinal Brace Spinal Brace: Lumbar corset Restrictions Weight Bearing Restrictions Per Provider Order: No       Mobility Bed Mobility Overal bed mobility: Needs Assistance Bed Mobility: Rolling, Sit to Sidelying, Sidelying to Sit Rolling: Contact guard assist Sidelying to sit: Contact guard assist     Sit to sidelying: Contact guard assist      Transfers Overall transfer level: Needs assistance Equipment used: Rolling walker (2 wheels) Transfers: Sit to/from  Stand Sit to Stand: From elevated surface, Contact guard assist                 Balance     Sitting balance-Leahy Scale: Fair         Standing balance comment: CGA with min assist          ADL either performed or assessed with clinical judgement   ADL Overall ADL's : Needs assistance/impaired            Toilet Transfer: Minimal assistance;Rolling walker (2 wheels);Ambulation;Grab bars Toilet Transfer Details (indicate cue type and reason): The pt ambulated to and from the bathroom in her room using a RW. OT instructed her on transferring onto and off a raised toilet surface, for improved ease and decreased back pain when transferring. Options for use of a toilet riser were discussed. Toileting- Clothing Manipulation and Hygiene: Minimal assistance;Sit to/from stand Toileting - Clothing Manipulation Details (indicate cue type and reason): The pt performed toileting management at bathroom level. She required CGA in standing and min assist for clothing management, as well as min verbal cues for best body positioning.             Communication Communication Communication: No apparent difficulties   Cognition Arousal: Alert Behavior During Therapy: WFL for tasks assessed/performed Cognition: No apparent impairments          Following commands: Intact        Cueing   Cueing Techniques: Verbal cues             Pertinent Vitals/ Pain       Pain Assessment Pain Assessment: 0-10 Pain Score: 7  Pain Location: back Pain Intervention(s): Limited activity  within patient's tolerance, Monitored during session, Repositioned   Frequency  Min 2X/week        Progress Toward Goals  OT Goals(current goals can now be found in the care plan section)     Acute Rehab OT Goals Patient Stated Goal: decreased pain and to resume prior activities OT Goal Formulation: With patient Time For Goal Achievement: 10/02/23 Potential to Achieve Goals: Good  Plan          AM-PAC OT 6 Clicks Daily Activity     Outcome Measure   Help from another person eating meals?: None Help from another person taking care of personal grooming?: A Little Help from another person toileting, which includes using toliet, bedpan, or urinal?: A Little Help from another person bathing (including washing, rinsing, drying)?: A Lot Help from another person to put on and taking off regular upper body clothing?: A Little Help from another person to put on and taking off regular lower body clothing?: A Lot 6 Click Score: 17    End of Session Equipment Utilized During Treatment: Gait belt;Rolling walker (2 wheels)  OT Visit Diagnosis: Unsteadiness on feet (R26.81);Other abnormalities of gait and mobility (R26.89);History of falling (Z91.81);Pain Pain - part of body:  (low back)   Activity Tolerance Patient limited by pain   Patient Left in bed;with call bell/phone within reach;with bed alarm set   Nurse Communication Patient requests pain meds        Time: 8479-8457 OT Time Calculation (min): 22 min  Charges: OT General Charges $OT Visit: 1 Visit OT Treatments $Self Care/Home Management : 8-22 mins     Delanna JINNY Lesches, OTR/L 09/20/2023, 5:13 PM

## 2023-09-20 NOTE — Progress Notes (Signed)
 Physical Therapy Treatment Patient Details Name: Madison Hernandez MRN: [id number] DOB: 13-Oct-1940 Today's Date: 09/20/2023   History of Present Illness Madison Hernandez is a 83 y.o. female admitted under observation 09/17/23 after a fall at home. CT lumbar spine -  Comminuted fracture through the anterior and middle columns of L1 vertebra with 25% loss of height centrally. PMH includes HLD, HTN, hypothyroidism, Dm2, anemia. CKD 3b    PT Comments  AxO x 3 but impulsive and HIGH anxiety.  Frequent trips to the bathrrom as Pt c/o consitpation.  Declines to wear back brace despite repeat Education.  Pt reports it doesn't help and pain is still there.  Pt also does not adhere to her back pracautions.  Easily distracted and very anxious. Assisted OOB.  General transfer comment: vc for safe hand placement with transfers, new use of RW for pain management and safety with turns. General Gait Details: very limited amb distance of 8 feet x 2 to and from bathroom due to increased back pain with activity.  Max VC's on proper walker use and distance to self.  Max VC's on safety with turns as Pt appears impulsive. Pt lives home alone.  LPT har rec Pt will need ST Rehab at SNF to address mobility and functional decline prior to safely returning home.    If plan is discharge home, recommend the following: A little help with walking and/or transfers;A little help with bathing/dressing/bathroom;Assistance with cooking/housework;Help with stairs or ramp for entrance;Assist for transportation   Can travel by private vehicle     Yes  Equipment Recommendations  None recommended by PT    Recommendations for Other Services       Precautions / Restrictions Precautions Precautions: Back Recall of Precautions/Restrictions: Intact Required Braces or Orthoses: Spinal Brace Spinal Brace: Lumbar corset Spinal Brace Comments: Pt declined to apply despite repeat instructions stating it doesn't help.  Also stated,  I've been getting up with out it just fine. Restrictions Weight Bearing Restrictions Per Provider Order: No     Mobility  Bed Mobility Overal bed mobility: Needs Assistance Bed Mobility: Rolling, Sit to Sidelying, Sidelying to Sit           General bed mobility comments: no physical assist, educated on log roll technique for back precautions, heavy cues to follow back precautions.    Transfers Overall transfer level: Needs assistance Equipment used: Rolling walker (2 wheels) Transfers: Sit to/from Stand Sit to Stand: Min assist           General transfer comment: vc for safe hand placement with transfers, new use of RW for pain management and safety with turns.    Ambulation/Gait Ambulation/Gait assistance: Contact guard assist, Min assist Gait Distance (Feet): 16 Feet (8 feet x 2) Assistive device: Rolling walker (2 wheels)         General Gait Details: very limited amb distance of 8 feet x 2 to and from bathroom due to increased back pain with activity.  Max VC's on proper walker use and distance to self.  Max VC's on safety with turns as Pt appears impulsive.   Stairs             Wheelchair Mobility     Tilt Bed    Modified Rankin (Stroke Patients Only)       Balance  Communication Communication Communication: No apparent difficulties  Cognition Arousal: Alert Behavior During Therapy: WFL for tasks assessed/performed   PT - Cognitive impairments: Memory, Awareness, Safety/Judgement, Problem solving                       PT - Cognition Comments: appears AxO x 3 but impulsive and HIGH anxiety.  Frequent trips to the bathrrom as Pt c/o consitpation.  Declines to wear back brace despite repeat Education.  Pt reports it doesn't help and pain is still there.  Pt also does not adhere to her back pracautions.  Easily distracted and very anxious. Following commands: Intact       Cueing Cueing Techniques: Verbal cues  Exercises      General Comments        Pertinent Vitals/Pain Pain Assessment Pain Assessment: Faces Pain Score: 10-Worst pain ever Pain Location: reports 10/10 back pain with sitting Pain Descriptors / Indicators: Discomfort, Grimacing Pain Intervention(s): Monitored during session, Repositioned    Home Living                          Prior Function            PT Goals (current goals can now be found in the care plan section) Progress towards PT goals: Progressing toward goals    Frequency           PT Plan      Co-evaluation              AM-PAC PT 6 Clicks Mobility   Outcome Measure  Help needed turning from your back to your side while in a flat bed without using bedrails?: A Little Help needed moving from lying on your back to sitting on the side of a flat bed without using bedrails?: A Little Help needed moving to and from a bed to a chair (including a wheelchair)?: A Little Help needed standing up from a chair using your arms (e.g., wheelchair or bedside chair)?: A Little Help needed to walk in hospital room?: A Little Help needed climbing 3-5 steps with a railing? : A Lot 6 Click Score: 17    End of Session Equipment Utilized During Treatment: Gait belt Activity Tolerance: Patient limited by pain Patient left: in bed;with call bell/phone within reach;with bed alarm set Nurse Communication: Mobility status PT Visit Diagnosis: Other abnormalities of gait and mobility (R26.89)     Time: 9056-8994 PT Time Calculation (min) (ACUTE ONLY): 22 min  Charges:    $Gait Training: 8-22 mins PT General Charges $$ ACUTE PT VISIT: 1 Visit                    Katheryn Leap  PTA Acute  Rehabilitation Services Office M-F          [phone]

## 2023-09-20 NOTE — Plan of Care (Signed)
  Problem: Education: Goal: Ability to describe self-care measures that may prevent or decrease complications (Diabetes Survival Skills Education) will improve Outcome: Progressing Goal: Individualized Educational Video(s) Outcome: Progressing   Problem: Coping: Goal: Ability to adjust to condition or change in health will improve Outcome: Progressing   Problem: Fluid Volume: Goal: Ability to maintain a balanced intake and output will improve Outcome: Progressing   Problem: Health Behavior/Discharge Planning: Goal: Ability to identify and utilize available resources and services will improve Outcome: Progressing Goal: Ability to manage health-related needs will improve Outcome: Progressing   Problem: Metabolic: Goal: Ability to maintain appropriate glucose levels will improve Outcome: Progressing   Problem: Nutritional: Goal: Maintenance of adequate nutrition will improve Outcome: Progressing Goal: Progress toward achieving an optimal weight will improve Outcome: Progressing   Problem: Education: Goal: Knowledge of General Education information will improve Description: Including pain rating scale, medication(s)/side effects and non-pharmacologic comfort measures Outcome: Progressing   Problem: Clinical Measurements: Goal: Ability to maintain clinical measurements within normal limits will improve Outcome: Progressing Goal: Will remain free from infection Outcome: Progressing Goal: Diagnostic test results will improve Outcome: Progressing Goal: Respiratory complications will improve Outcome: Progressing Goal: Cardiovascular complication will be avoided Outcome: Progressing   Problem: Activity: Goal: Risk for activity intolerance will decrease Outcome: Progressing   Problem: Nutrition: Goal: Adequate nutrition will be maintained Outcome: Progressing   Problem: Coping: Goal: Level of anxiety will decrease Outcome: Progressing   Problem: Elimination: Goal:  Will not experience complications related to bowel motility Outcome: Progressing Goal: Will not experience complications related to urinary retention Outcome: Progressing   Problem: Pain Managment: Goal: General experience of comfort will improve and/or be controlled Outcome: Progressing   Problem: Safety: Goal: Ability to remain free from injury will improve Outcome: Progressing   Problem: Skin Integrity: Goal: Risk for impaired skin integrity will decrease Outcome: Progressing

## 2023-09-20 NOTE — TOC Progression Note (Addendum)
 Transition of Care [facility]) - Progression Note    Patient Details  Name: Madison Hernandez MRN: [id number] Date of Birth: July 22, 1940  [facility]) CM/SW Contact  Heather DELENA Saltness, LCSW Phone Number: 09/20/2023, 11:16 AM  Clinical Narrative:    CSW met with pt at bedside to discuss SNF bed availability for short-term rehab. CSW provided pt with list of facilities with available beds, including name of facility, location, and Medicare Star-Rating. Pt chooses bed at [facility] CSW will request insurance authorization.  Medicare Star-Ratings  [facility] [address] [place], [place] [address] [phone] Overall rating ?? Below average  [facility] [address] [place], [place] [address] [phone] Overall rating ???? Below average  [facility] [address] [place], [place] [address] [phone] Overall rating ?? Much below average  [employer] [facility] [employer] [address] [place], [place] [address] [phone] Overall rating ? Average  [facility] [address] [place], [place] [address] [phone] Overall rating ??? Above average  [facility] [address] [place], [place] [address] [phone] Overall rating ??? Much above average  [facility] [address] [place], [place] [address] [phone] Overall rating ?????  [facility] [address] [place], [place] [address] [phone] Overall rating ????? Much above average   Expected Discharge Plan: Skilled Nursing Facility Barriers to Discharge: Continued Medical Work up, SNF Pending bed offer   Expected Discharge Plan and Services In-house Referral: Clinical Social Work Discharge Planning Services: NA Post Acute Care Choice: Skilled Nursing Facility Living  arrangements for the past 2 months: Apartment                 DME Arranged: N/A DME Agency: NA       HH Arranged: NA HH Agency: NA         Social Drivers of Health (SDOH) Interventions SDOH Screenings   Food Insecurity: No Food Insecurity (09/17/2023)  Housing: Low Risk  (09/17/2023)  Transportation Needs: No Transportation Needs (09/17/2023)  Utilities: Not At Risk (09/17/2023)  Depression (PHQ2-9): Low Risk  (07/08/2023)  Social Connections: Socially Isolated (09/17/2023)  Tobacco Use: Medium Risk (09/17/2023)    Readmission Risk Interventions    09/19/2023    1:06 PM  Readmission Risk Prevention Plan  Post Dischage Appt Complete  Medication Screening Complete  Transportation Screening Complete    Signed: Heather Saltness, MSW, LCSW Clinical Social Worker Inpatient Care Management 09/20/2023 11:20 AM

## 2023-09-20 NOTE — Discharge Summary (Addendum)
 [facility]  Physician Discharge Summary   Patient ID: Madison Hernandez MRN: [id number] DOB/AGE: [birth year] 83 y.o.  Admit date: 09/17/2023 Discharge date:   09/20/2023   PCP: Micheal Wolm ORN, MD  DISCHARGE DIAGNOSES:    L1 vertebral fracture (HCC)   Syncope Vertigo   HTN (hypertension)   Mixed hyperlipidemia   Chronic kidney disease   Hypothyroid   Type 2 diabetes mellitus with hyperglycemia (HCC)   AKI (acute kidney injury) (HCC)   RECOMMENDATIONS FOR OUTPATIENT FOLLOW UP: Check CBC and basic metabolic panel in 3 to 4 days She will need follow-up with neurosurgery: Dr. Louis, in 2 to 3 weeks, for the L1 compression fracture   Home Health: SNF Equipment/Devices: None  CODE STATUS: Full code   DISCHARGE CONDITION: fair  Diet recommendation: Heart healthy  INITIAL HISTORY: 83 y.o. female with medical history significant of HLD, HTN, hypothyroidism, Dm2, anemia, CKD 3b who presented after sustaining a fall at home.  There was concern that she may have had a syncopal episode.  Patient was noted to be dehydrated.  Found to have L1 compression fracture.  Was hospitalized for further management.   Consultants: Phone discussion with neurosurgery by EDP   Procedures: None  HOSPITAL COURSE:   Syncope Possibly in the setting of dehydration and AKI.  Etiology unclear. No arrhythmias identified.  Echocardiogram shows normal LVEF. No further episodes in the hospital.  Has been able to ambulate without difficulty.   Vertigo Describes vertiginous symptoms.  No focal neurological deficits noted.  CT head done at the time of admission did not show any acute findings. She has had vertigo previously. Seems to have improved with meclizine .   Acute kidney injury on chronic kidney disease stage IIIb Baseline creatinine seems to be around 1.6.  Came in with a creatinine of around 2.  Improved with hydration.     L1 compression fracture Case was discussed with  neurosurgery by EDP.  They recommended LSO brace and outpatient follow-up.  No role for any procedures in the hospital at this time.  Patient does not have any neurological deficits.  SNF is recommended for short-term rehab. Tylenol  on a scheduled basis for a few days.  Continue with hydrocodone  as needed.   Essential hypertension Continue amlodipine .  ARB placed on hold.  HCTZ placed on hold.  Wanted to blood pressure trends.  Can increase the dose of amlodipine  if needed.   Hypothyroidism Continue levothyroxine .   Diabetes mellitus type 2 Does not take any medications at home for same.   Normocytic anemia Hemoglobin is stable.  No evidence of overt bleeding.   Leukocytosis Most likely reactive.  UA was unremarkable.  She was afebrile.  Now resolved.   Vitamin D  deficiency Started vitamin D  supplementation  Patient is stable.  Okay for discharge to SNF when bed is available.  PERTINENT LABS:  The results of significant diagnostics from this hospitalization (including imaging, microbiology, ancillary and laboratory) are listed below for reference.     Labs:   Basic Metabolic Panel: Recent Labs  Lab 09/17/23 1557 09/18/23 0026 09/18/23 0644 09/19/23 0553  NA 133* 135 136 135  K 4.0 4.3 4.4 4.2  CL 97* 100 99 99  CO2 22 22 23  21*  GLUCOSE 122* 116* 91 109*  BUN 39* 36* 33* 22  CREATININE 2.15* 1.70* 1.69* 1.16*  CALCIUM  10.0 9.4 9.6 9.8  MG  --  2.0 2.1  --   PHOS  --  3.1 3.0  --  Liver Function Tests: Recent Labs  Lab 09/17/23 1557 09/18/23 0644  AST 26 28  ALT 20 20  ALKPHOS 135* 120  BILITOT 0.3 0.5  PROT 6.5 6.3*  ALBUMIN 4.1 4.0   CBC: Recent Labs  Lab 09/17/23 1557 09/18/23 0644 09/19/23 0553  WBC 19.8* 12.2* 9.2  NEUTROABS 17.2*  --   --   HGB 10.9* 10.7* 10.3*  HCT 34.3* 34.7* 32.6*  MCV 89.3 90.8 90.1  PLT 312 257 189   Cardiac Enzymes: Recent Labs  Lab 09/18/23 0026  CKTOTAL 104    CBG: Recent Labs  Lab 09/19/23 1652  09/19/23 2017 09/19/23 2330 09/20/23 0406 09/20/23 0726  GLUCAP 101* 148* 104* 108* 94     IMAGING STUDIES ECHOCARDIOGRAM COMPLETE Result Date: 09/18/2023    ECHOCARDIOGRAM REPORT   Patient Name:   Madison Hernandez Date of Exam: 09/18/2023 Medical Rec #:  [id number]       Height:       64.0 in Accession #:    [id number]      Weight:       147.3 lb Date of Birth:  1940/04/04       BSA:          1.718 m Patient Age:    83 years        BP:           121/69 mmHg Patient Gender: F               HR:           59 bpm. Exam Location:  Inpatient Procedure: 2D Echo, Cardiac Doppler and Color Doppler (Both Spectral and Color            Flow Doppler were utilized during procedure). Indications:    Syncope  History:        Patient has prior history of Echocardiogram examinations, most                 recent 06/24/2016. Risk Factors:Hypertension, Dyslipidemia and                 Diabetes.  Sonographer:    Therisa Crouch Referring Phys: [id number] ANASTASSIA DOUTOVA IMPRESSIONS  1. Left ventricular ejection fraction, by estimation, is 60 to 65%. The left ventricle has normal function. The left ventricle has no regional wall motion abnormalities. There is mild concentric left ventricular hypertrophy. Left ventricular diastolic parameters are indeterminate.  2. Right ventricular systolic function is normal. The right ventricular size is normal.  3. The mitral valve is normal in structure. No evidence of mitral valve regurgitation. No evidence of mitral stenosis.  4. Tricuspid valve regurgitation is mild to moderate.  5. The aortic valve is normal in structure. Aortic valve regurgitation is trivial. No aortic stenosis is present.  6. The inferior vena cava is normal in size with greater than 50% respiratory variability, suggesting right atrial pressure of 3 mmHg. FINDINGS  Left Ventricle: Left ventricular ejection fraction, by estimation, is 60 to 65%. The left ventricle has normal function. The left ventricle has no regional wall  motion abnormalities. The left ventricular internal cavity size was normal in size. There is  mild concentric left ventricular hypertrophy. Left ventricular diastolic parameters are indeterminate. Right Ventricle: The right ventricular size is normal. No increase in right ventricular wall thickness. Right ventricular systolic function is normal. Left Atrium: Left atrial size was normal in size. Right Atrium: Right atrial size was normal in size. Pericardium: There is no evidence  of pericardial effusion. Presence of epicardial fat layer. Mitral Valve: The mitral valve is normal in structure. No evidence of mitral valve regurgitation. No evidence of mitral valve stenosis. Tricuspid Valve: The tricuspid valve is normal in structure. Tricuspid valve regurgitation is mild to moderate. No evidence of tricuspid stenosis. Aortic Valve: The aortic valve is normal in structure. Aortic valve regurgitation is trivial. No aortic stenosis is present. Aortic valve mean gradient measures 4.0 mmHg. Aortic valve peak gradient measures 8.4 mmHg. Aortic valve area, by VTI measures 3.39 cm. Pulmonic Valve: The pulmonic valve was normal in structure. Pulmonic valve regurgitation is trivial. No evidence of pulmonic stenosis. Aorta: The aortic root is normal in size and structure. Venous: The inferior vena cava is normal in size with greater than 50% respiratory variability, suggesting right atrial pressure of 3 mmHg. IAS/Shunts: No atrial level shunt detected by color flow Doppler.  LEFT VENTRICLE PLAX 2D LVIDd:         4.10 cm   Diastology LVIDs:         2.50 cm   LV e' medial:    5.98 cm/s LV PW:         1.20 cm   LV E/e' medial:  8.9 LV IVS:        1.10 cm   LV e' lateral:   9.25 cm/s LVOT diam:     2.10 cm   LV E/e' lateral: 5.7 LV SV:         81 LV SV Index:   47 LVOT Area:     3.46 cm  RIGHT VENTRICLE             IVC RV Basal diam:  3.20 cm     IVC diam: 1.30 cm RV S prime:     16.80 cm/s TAPSE (M-mode): 2.4 cm LEFT ATRIUM              Index        RIGHT ATRIUM           Index LA diam:        3.40 cm 1.98 cm/m   RA Area:     15.60 cm LA Vol (A2C):   68.9 ml 40.11 ml/m  RA Volume:   36.60 ml  21.31 ml/m LA Vol (A4C):   32.3 ml 18.80 ml/m LA Biplane Vol: 49.9 ml 29.05 ml/m  AORTIC VALVE AV Area (Vmax):    2.68 cm AV Area (Vmean):   2.89 cm AV Area (VTI):     3.39 cm AV Vmax:           145.00 cm/s AV Vmean:          92.300 cm/s AV VTI:            0.239 m AV Peak Grad:      8.4 mmHg AV Mean Grad:      4.0 mmHg LVOT Vmax:         112.00 cm/s LVOT Vmean:        77.100 cm/s LVOT VTI:          0.234 m LVOT/AV VTI ratio: 0.98  AORTA Ao Asc diam: 2.90 cm MITRAL VALVE               TRICUSPID VALVE MV Area (PHT): 2.50 cm    TR Peak grad:   25.8 mmHg MV E velocity: 53.10 cm/s  TR Vmax:        254.00 cm/s MV A velocity: 78.80 cm/s MV E/A  ratio:  0.67        SHUNTS                            Systemic VTI:  0.23 m                            Systemic Diam: 2.10 cm Dub Tobb DO Electronically signed by Dub Huntsman DO Signature Date/Time: 09/18/2023/4:27:10 PM    Final    CT Lumbar Spine Wo Contrast Result Date: 09/17/2023 CLINICAL DATA:  Lumbar radiculopathy.  Trip and fall.  Back pain. EXAM: CT LUMBAR SPINE WITHOUT CONTRAST TECHNIQUE: Multidetector CT imaging of the lumbar spine was performed without intravenous contrast administration. Multiplanar CT image reconstructions were also generated. RADIATION DOSE REDUCTION: This exam was performed according to the departmental dose-optimization program which includes automated exposure control, adjustment of the mA and/or kV according to patient size and/or use of iterative reconstruction technique. COMPARISON:  None Available. FINDINGS: Segmentation: 5 lumbar type vertebrae. Alignment: Normal. Vertebrae: Comminuted fracture through the anterior middle columns of L1 vertebra. There is mild displacement of the superior endplate. 25% loss of height centrally. There is slight buckling of the posterior  cortex. No extension into the lamina or pedicles. Prominent Schmorl's node involving superior endplate of T12 is chronic when compared to prior thoracic spine MRI. The bones are subjectively under mineralized. No additional acute fracture. The included sacrum is intact. Paraspinal and other soft tissues: Minimal L1 perispinal soft tissue thickening at site of fracture. Aortic atherosclerosis. Colonic diverticulosis. Disc levels: Moderate degenerative disc disease at L2-L3 and L5-S1. Multilevel facet hypertrophy. IMPRESSION: 1. Comminuted fracture through the anterior and middle columns of L1 vertebra with 25% loss of height centrally. Slight buckling of the posterior cortex. No extension into the lamina or pedicles. 2. Moderate degenerative disc disease at L2-L3 and L5-S1. Multilevel facet hypertrophy. Aortic Atherosclerosis (ICD10-I70.0). Electronically Signed   By: Andrea Gasman M.D.   On: 09/17/2023 17:27   CT Cervical Spine Wo Contrast Result Date: 09/17/2023 CLINICAL DATA:  Neck trauma (Age >= 65y) Trip and fall. EXAM: CT CERVICAL SPINE WITHOUT CONTRAST TECHNIQUE: Multidetector CT imaging of the cervical spine was performed without intravenous contrast. Multiplanar CT image reconstructions were also generated. RADIATION DOSE REDUCTION: This exam was performed according to the departmental dose-optimization program which includes automated exposure control, adjustment of the mA and/or kV according to patient size and/or use of iterative reconstruction technique. COMPARISON:  None Available. FINDINGS: Alignment: No traumatic subluxation. Trace anterolisthesis of C3 on C4 and C5 on C6 likely due to facet hypertrophy. Skull base and vertebrae: No acute fracture. Cervical vertebral body heights are maintained. Chronic mild T2 and T3 compression deformities, unchanged from 2018 thoracic spine MRI. The dens and skull base are intact. Soft tissues and spinal canal: No prevertebral fluid or swelling. No visible  canal hematoma. Disc levels: Mild multilevel degenerative disc disease with moderate diffuse facet hypertrophy. Upper chest: No acute findings. Right thyroid  nodule was previously assessed on thyroid  ultrasound. Other: None. IMPRESSION: 1. No acute fracture or traumatic subluxation of the cervical spine. 2. Multilevel degenerative disc disease and facet hypertrophy. Electronically Signed   By: Andrea Gasman M.D.   On: 09/17/2023 17:22   CT Head Wo Contrast Result Date: 09/17/2023 CLINICAL DATA:  Trip and fall. EXAM: CT HEAD WITHOUT CONTRAST TECHNIQUE: Contiguous axial images were obtained from the base of the skull through  the vertex without intravenous contrast. RADIATION DOSE REDUCTION: This exam was performed according to the departmental dose-optimization program which includes automated exposure control, adjustment of the mA and/or kV according to patient size and/or use of iterative reconstruction technique. COMPARISON:  Head CT 08/05/2016 FINDINGS: Brain: No intracranial hemorrhage, mass effect, or midline shift. Brain volume is normal for age. No hydrocephalus. The basilar cisterns are patent. Periventricular and deep white matter hypodensity, nonspecific but typical of chronic small vessel ischemia. Mild progression from remote exam. No evidence of territorial infarct or acute ischemia. No extra-axial or intracranial fluid collection. Vascular: Atherosclerosis of skullbase vasculature without hyperdense vessel or abnormal calcification. Skull: No fracture or focal lesion. Sinuses/Orbits: No acute finding. Other: No confluent scalp hematoma. IMPRESSION: 1. No acute intracranial abnormality. No skull fracture. 2. Chronic small vessel ischemia. Electronically Signed   By: Andrea Gasman M.D.   On: 09/17/2023 17:17   DG Hip Unilat With Pelvis 2-3 Views Left Result Date: 09/17/2023 CLINICAL DATA:  Trip and fall with bilateral hip pain. EXAM: DG HIP (WITH OR WITHOUT PELVIS) 2-3V LEFT; DG HIP (WITH OR  WITHOUT PELVIS) 2-3V RIGHT COMPARISON:  None Available. FINDINGS: No acute fracture of the pelvis or hips. No hip dislocation. There is bilateral hip joint space narrowing with acetabular spurring. Pubic rami are intact. Pubic symphysis and sacroiliac joints are congruent. No gross disruption of sacral ala. No erosive change. Pelvic phleboliths. IMPRESSION: 1. No acute fracture of the pelvis or hips. 2. Mild bilateral hip osteoarthritis. Electronically Signed   By: Andrea Gasman M.D.   On: 09/17/2023 15:13   DG Hip Unilat  With Pelvis 2-3 Views Right Result Date: 09/17/2023 CLINICAL DATA:  Trip and fall with bilateral hip pain. EXAM: DG HIP (WITH OR WITHOUT PELVIS) 2-3V LEFT; DG HIP (WITH OR WITHOUT PELVIS) 2-3V RIGHT COMPARISON:  None Available. FINDINGS: No acute fracture of the pelvis or hips. No hip dislocation. There is bilateral hip joint space narrowing with acetabular spurring. Pubic rami are intact. Pubic symphysis and sacroiliac joints are congruent. No gross disruption of sacral ala. No erosive change. Pelvic phleboliths. IMPRESSION: 1. No acute fracture of the pelvis or hips. 2. Mild bilateral hip osteoarthritis. Electronically Signed   By: Andrea Gasman M.D.   On: 09/17/2023 15:13    DISCHARGE EXAMINATION: Vitals:   09/19/23 1043 09/19/23 1213 09/19/23 2015 09/20/23 0403  BP: (!) 163/82 (!) 157/72 (!) 162/81 133/79  Pulse: 78 98  98  Resp:  19 18 18   Temp:  98.5 F (36.9 C) 99 F (37.2 C) 99 F (37.2 C)  TempSrc:   Oral Oral  SpO2:  93% 99% 96%  Weight:      Height:       General appearance: Awake alert.  In no distress Resp: Clear to auscultation bilaterally.  Normal effort Cardio: S1-S2 is normal regular.  No S3-S4.  No rubs murmurs or bruit GI: Abdomen is soft.  Nontender nondistended.  Bowel sounds are present normal.  No masses organomegaly Moving all 4 extremities. No obvious focal neurological deficits.  DISPOSITION: SNF  Discharge Instructions     Call MD  for:  difficulty breathing, headache or visual disturbances   Complete by: As directed    Call MD for:  extreme fatigue   Complete by: As directed    Call MD for:  persistant dizziness or light-headedness   Complete by: As directed    Call MD for:  persistant nausea and vomiting   Complete by: As directed  Call MD for:  severe uncontrolled pain   Complete by: As directed    Call MD for:  temperature >100.4   Complete by: As directed    Diet - low sodium heart healthy   Complete by: As directed    Discharge instructions   Complete by: As directed    Please review instructions on the discharge summary.  You were cared for by a hospitalist during your hospital stay. If you have any questions about your discharge medications or the care you received while you were in the hospital after you are discharged, you can call the unit and asked to speak with the hospitalist on call if the hospitalist that took care of you is not available. Once you are discharged, your primary care physician will handle any further medical issues. Please note that NO REFILLS for any discharge medications will be authorized once you are discharged, as it is imperative that you return to your primary care physician (or establish a relationship with a primary care physician if you do not have one) for your aftercare needs so that they can reassess your need for medications and monitor your lab values. If you do not have a primary care physician, you can call [phone] for a physician referral.   Increase activity slowly   Complete by: As directed          Allergies as of 09/20/2023       Reactions   Avelox [moxifloxacin Hcl In Nacl] Shortness Of Breath   Mucinex [guaifenesin Er] Other (See Comments)   Makes my heart flutter   Other    Other reaction(s): Unknown   Sulfa  Antibiotics Hives   Albuterol  Swelling, Palpitations   Cefdinir Rash        Medication List     STOP taking these medications     benazepril  20 MG tablet Commonly known as: LOTENSIN    furosemide  20 MG tablet Commonly known as: LASIX    hydrochlorothiazide  12.5 MG capsule Commonly known as: MICROZIDE    traMADol  50 MG tablet Commonly known as: ULTRAM        TAKE these medications    acetaminophen  500 MG tablet Commonly known as: TYLENOL  Take 1 tablet (500 mg total) by mouth 3 (three) times daily for 3 days.   ALPRAZolam  0.25 MG tablet Commonly known as: XANAX  Take 1 tablet (0.25 mg total) by mouth 2 (two) times daily as needed for anxiety.   amLODipine  5 MG tablet Commonly known as: NORVASC  Take 1 tablet (5 mg total) by mouth daily.   carboxymethylcellulose 0.5 % Soln Commonly known as: REFRESH PLUS 1 drop 3 (three) times daily as needed.   feeding supplement Liqd Take 237 mLs by mouth 2 (two) times daily between meals.   gabapentin  300 MG capsule Commonly known as: NEURONTIN  TAKE 1 CAPSULE BY MOUTH 3 TIMES  DAILY What changed: when to take this   HYDROcodone -acetaminophen  5-325 MG tablet Commonly known as: NORCO/VICODIN Take 1-2 tablets by mouth every 4 (four) hours as needed for moderate pain (pain score 4-6).   ketoconazole  2 % cream Commonly known as: NIZORAL  Apply 1 Application topically daily.   levothyroxine  25 MCG tablet Commonly known as: SYNTHROID  Take 1 tablet (25 mcg total) by mouth daily before breakfast.   Linzess  145 MCG Caps capsule Generic drug: linaclotide  TAKE 1 CAPSULE BY MOUTH DAILY  BEFORE BREAKFAST   meclizine  25 MG tablet Commonly known as: ANTIVERT  Take 1 tablet (25 mg total) by mouth 3 (three) times daily.  meloxicam  15 MG tablet Commonly known as: MOBIC  Take once a day as needed for arthritis pain. Take with a meal   methocarbamol  500 MG tablet Commonly known as: ROBAXIN  Take 1 tablet (500 mg total) by mouth every 8 (eight) hours as needed for muscle spasms.   polyethylene glycol 17 g packet Commonly known as: MIRALAX  / GLYCOLAX  Take 17 g by  mouth daily as needed for severe constipation.   rosuvastatin  40 MG tablet Commonly known as: CRESTOR  TAKE 1 TABLET BY MOUTH DAILY   Vitamin D  (Ergocalciferol ) 1.25 MG (50000 UNIT) Caps capsule Commonly known as: DRISDOL  Take 1 capsule (50,000 Units total) by mouth every 7 (seven) days. Start taking on: September 25, 2023          Follow-up Information     Jennetta Sayres D, NP Follow up.   Specialty: Neurosurgery Why: post hospitalization follow up for l1 compression fx Contact information: [address] [place] [address] [phone]         Louis Shove, MD .   Specialty: Neurosurgery Contact information: [address] [place] [address] [phone]         Micheal Wolm ORN, MD. Schedule an appointment as soon as possible for a visit in 1 week(s).   Specialty: Family Medicine Why: post hospitalization follow up Contact information: [address] [place] [facility] [place] [address] [phone]                 TOTAL DISCHARGE TIME: 35 minutes  Chelsye Suhre Verdene  [facility] Pager on [email or web address]  09/20/2023, 9:46 AM

## 2023-09-21 ENCOUNTER — Other Ambulatory Visit: Payer: Self-pay | Admitting: Family Medicine

## 2023-09-21 DIAGNOSIS — N183 Chronic kidney disease, stage 3 unspecified: Secondary | ICD-10-CM | POA: Diagnosis not present

## 2023-09-21 DIAGNOSIS — R2689 Other abnormalities of gait and mobility: Secondary | ICD-10-CM | POA: Diagnosis not present

## 2023-09-21 DIAGNOSIS — R2681 Unsteadiness on feet: Secondary | ICD-10-CM | POA: Diagnosis not present

## 2023-09-21 DIAGNOSIS — R55 Syncope and collapse: Secondary | ICD-10-CM | POA: Diagnosis not present

## 2023-09-21 DIAGNOSIS — N1832 Chronic kidney disease, stage 3b: Secondary | ICD-10-CM | POA: Diagnosis not present

## 2023-09-21 DIAGNOSIS — S32010G Wedge compression fracture of first lumbar vertebra, subsequent encounter for fracture with delayed healing: Secondary | ICD-10-CM | POA: Diagnosis not present

## 2023-09-21 DIAGNOSIS — M6281 Muscle weakness (generalized): Secondary | ICD-10-CM | POA: Diagnosis not present

## 2023-09-21 DIAGNOSIS — S32018A Other fracture of first lumbar vertebra, initial encounter for closed fracture: Secondary | ICD-10-CM | POA: Diagnosis not present

## 2023-09-21 DIAGNOSIS — Z9181 History of falling: Secondary | ICD-10-CM | POA: Diagnosis not present

## 2023-09-21 DIAGNOSIS — I10 Essential (primary) hypertension: Secondary | ICD-10-CM | POA: Diagnosis not present

## 2023-09-21 DIAGNOSIS — E559 Vitamin D deficiency, unspecified: Secondary | ICD-10-CM | POA: Diagnosis not present

## 2023-09-21 DIAGNOSIS — E039 Hypothyroidism, unspecified: Secondary | ICD-10-CM | POA: Diagnosis not present

## 2023-09-21 DIAGNOSIS — I129 Hypertensive chronic kidney disease with stage 1 through stage 4 chronic kidney disease, or unspecified chronic kidney disease: Secondary | ICD-10-CM | POA: Diagnosis not present

## 2023-09-21 DIAGNOSIS — G909 Disorder of the autonomic nervous system, unspecified: Secondary | ICD-10-CM | POA: Diagnosis not present

## 2023-09-21 DIAGNOSIS — E1165 Type 2 diabetes mellitus with hyperglycemia: Secondary | ICD-10-CM | POA: Diagnosis not present

## 2023-09-21 DIAGNOSIS — R1312 Dysphagia, oropharyngeal phase: Secondary | ICD-10-CM | POA: Diagnosis not present

## 2023-09-21 DIAGNOSIS — N179 Acute kidney failure, unspecified: Secondary | ICD-10-CM | POA: Diagnosis not present

## 2023-09-21 DIAGNOSIS — E1122 Type 2 diabetes mellitus with diabetic chronic kidney disease: Secondary | ICD-10-CM | POA: Diagnosis not present

## 2023-09-21 DIAGNOSIS — S32010D Wedge compression fracture of first lumbar vertebra, subsequent encounter for fracture with routine healing: Secondary | ICD-10-CM | POA: Diagnosis not present

## 2023-09-21 DIAGNOSIS — E782 Mixed hyperlipidemia: Secondary | ICD-10-CM | POA: Diagnosis not present

## 2023-09-21 LAB — GLUCOSE, CAPILLARY
Glucose-Capillary: 83 mg/dL (ref 70–99)
Glucose-Capillary: 84 mg/dL (ref 70–99)
Glucose-Capillary: 102 mg/dL — ABNORMAL HIGH (ref 70–99)

## 2023-09-21 NOTE — Progress Notes (Signed)
 Called report to Warren ,LPN at [employer].

## 2023-09-21 NOTE — Hospital Course (Addendum)
 The patient is an 83 y.o. female with medical history significant of HLD, HTN, hypothyroidism, DM2, anemia, CKD 3b who presented after sustaining a fall at home. There was concern that she may have had a syncopal episode. Patient was noted to be dehydrated. Found to have L1 compression fracture. Was hospitalized for further management and the case was discussed with neurosurgery by the EDP.  The neurosurgery team recommended an LSO brace and outpatient follow-up.  Patient also described vertiginous symptoms and no focal deficits and head CT showed no acute findings.  This improved with meclizine  and it was thought that the patient may have syncopized but there were no arrhythmias identified and echocardiogram showed normal LVEF.  She had no further episodes while she was hospitalized and was able to ambulate without difficulty but PT/OT recommended skilled training and rehabilitation.  She is medically stable for discharge at this time will need follow-up with PCP, neurosurgery and cardiology in the outpatient setting.  Assessment and Plan:  Syncope: Possibly in the setting of dehydration and AKI.  UA was unremarkable etiology unclear but No arrhythmias identified. Troponin I went from 26 -> 28. Echocardiogram shows normal LVEF 60 to 65% with no regional wall motion abnormalities.  Head CT done and showed no acute intracranial abnormality. No skull fracture and there is also Chronic small vessel ischemia. No further episodes of syncope in the hospital.  Has been able to ambulate without difficulty but PT/OT recommending SNF.   Vertigo: Describes vertiginous symptoms.  No focal neurological deficits noted.  CT head done at the time of admission did not show any acute findings.She has had vertigo previously. Seems to have improved with Meclizine  25 mg po TID   Acute Kidney Injury on Chronic Kidney Disease Stage IIIb: Baseline creatinine seems to be around 1.6.  Came in with a creatinine of around 2.  Improved  with hydration as current BUN/Cr Trend:  Recent Labs  Lab 09/17/23 1557 09/18/23 0026 09/18/23 0644 09/19/23 0553  BUN 39* 36* 33* 22  CREATININE 2.15* 1.70* 1.69* 1.16*  -Avoid Nephrotoxic Medications, Contrast Dyes, Hypotension and Dehydration to Ensure Adequate Renal Perfusion and will need to Renally Adjust Meds. CTM and Trend Renal Function carefully and repeat CMP w/in 1 week   L1 Compression Fracture: Case was discussed with neurosurgery by EDP.  They recommended LSO brace and outpatient follow-up.  No role for any procedures in the hospital at this time.  Patient does not have any neurological deficits.  SNF is recommended for short-term rehab. Acetaminophen  500 mg po TID on a scheduled basis for a few days.  Continue with Hydrocodone -Acetaminophen  5-325 mg 1-2 tab q4h as needed and IV 0.5 Hydromorphone  q4hprn Severe Pain; C/w Methocarbamol  500 mg po q8hprn Muscle Spasms   Essential Hypertension: Continue Amlodipine  5 mg po Daily  ARB placed on hold.  HCTZ placed on hold and will not continue for now.  Wanted to blood pressure trends.  Can increase the dose of amlodipine  if needed. CTM BP per Protocol. Last BP reading was 157/69   Hypothyroidism: C/w Levothyroxine  25 mcg po Daily    Diabetes Mellitus Type 2: HbA1c was 6.6.  Continue with sensitive NovoLog /scale insulin  q4h. Does not take any medications at home for same. CBG Trend:  Recent Labs  Lab 09/20/23 1638 09/20/23 2004 09/20/23 2023 09/20/23 2345 09/21/23 0404 09/21/23 0724 09/21/23 1141  GLUCAP 137* 95 94 80 83 84 102*  -CTM and Trend Blood Sugars per Protocol.    Normocytic Anemia: Hemoglobin/Hct  is stable with current trend:  Recent Labs  Lab 09/17/23 1557 09/18/23 0644 09/19/23 0553  HGB 10.9* 10.7* 10.3*  HCT 34.3* 34.7* 32.6*  MCV 89.3 90.8 90.1  -Anemia panel done and showed an iron level of 18, UIBC of 297, TIBC 315, saturation ratios of 6%, ferritin level 212, folate of 14.5 and vitamin B12 level 500.  CTM for S/Sx of Bleeding; No overt bleeding noted. Repeat CBC w/in 1 week   Leukocytosis: Most likely reactive.  UA was unremarkable.  She was afebrile.  Now resolved as WBC Trend:  Recent Labs  Lab 09/17/23 1557 09/18/23 0644 09/19/23 0553  WBC 19.8* 12.2* 9.2  -CTM and Trend and repeat WBC w/in 1 week   Vitamin D  Deficiency: Vitamin D , 25-Hydroxy was 21.49. Started vitamin D  supplementation w/ Ergocalciferol  50,000 units po q7 Days  Overweight: Complicates overall prognosis and care. Estimated body mass index is 25.28 kg/m as calculated from the following:   Height as of this encounter: 5' 4 (1.626 m).   Weight as of this encounter: 66.8 kg. Weight Loss and Dietary Counseling given

## 2023-09-21 NOTE — TOC Transition Note (Signed)
 Transition of Care [facility]) - Discharge Note   Patient Details  Name: Madison Hernandez MRN: [id number] Date of Birth: 06-04-1940  [facility] CM/SW Contact:  Heather DELENA Saltness, LCSW Phone Number: 09/21/2023, 10:47 AM   Clinical Narrative:    Pt discharging to [facility] today for short-term rehab. Pt accepted to room 106. D/C packet placed in pt's chart at RN station. RN to call report to [phone]. Pt's sister, Madison Hernandez, to transport pt to facility upon discharge. Patient and family in agreement with discharge plan. No further TOC needs at this time.   Final next level of care: Skilled Nursing Facility Barriers to Discharge: Barriers Resolved   Patient Goals and CMS Choice Patient states their goals for this hospitalization and ongoing recovery are:: To go to [employer].gov Compare Post Acute Care list provided to:: Patient Choice offered to / list presented to : Patient [place] ownership interest in [facility].provided to:: Patient    Discharge Placement  [facility]            Patient chooses bed at: [facility] Patient to be transferred to facility by: Pt's sister Name of family member notified: Madison Hernandez Patient and family notified of of transfer: 09/21/23  Discharge Plan and Services Additional resources added to the After Visit Summary for  Follow Up In-house Referral: Clinical Social Work Discharge Planning Services: NA Post Acute Care Choice: Skilled Nursing Facility          DME Arranged: N/A DME Agency: NA       HH Arranged: NA HH Agency: NA        Social Drivers of Health (SDOH) Interventions SDOH Screenings   Food Insecurity: No Food Insecurity (09/17/2023)  Housing: Low Risk  (09/17/2023)  Transportation Needs: No Transportation Needs (09/17/2023)  Utilities: Not At Risk (09/17/2023)  Depression (PHQ2-9): Low Risk  (07/08/2023)  Social Connections: Socially Isolated (09/17/2023)  Tobacco  Use: Medium Risk (09/17/2023)     Readmission Risk Interventions    09/19/2023    1:06 PM  Readmission Risk Prevention Plan  Post Dischage Appt Complete  Medication Screening Complete  Transportation Screening Complete    Signed: Heather Saltness, MSW, LCSW Clinical Social Worker Inpatient Care Management 09/21/2023 2:09 PM

## 2023-09-21 NOTE — Plan of Care (Signed)

## 2023-09-21 NOTE — Progress Notes (Signed)
 Pt transported to [facility] by her sister, Heron Rattler.

## 2023-09-21 NOTE — Discharge Summary (Signed)
 Physician Discharge Summary   Patient: Madison Hernandez MRN: [id number] DOB: 19-Feb-1940  Admit date:     09/17/2023  Discharge date: 09/21/23  Discharge Physician: Madison Marker, DO   PCP: Madison Hernandez ORN, MD   Recommendations at discharge:   Follow-up with PCP within 1 to 2 weeks repeat CBC, CMP, mag, Phos within 1 week Follow-up with neurosurgery Madison Hernandez within 2 to 3 weeks for the L1 compression fracture follow-up Follow-up with Cardiology outpatient setting  Discharge Diagnoses: Principal Problem:   L1 vertebral fracture (HCC) Active Problems:   Syncope   HTN (hypertension)   Mixed hyperlipidemia   Chronic kidney disease   Hypothyroid   Type 2 diabetes mellitus with hyperglycemia (HCC)   AKI (acute kidney injury) (HCC)  Resolved Problems:   * No resolved hospital problems. [facility] Course: The patient is an 83 y.o. female with medical history significant of HLD, HTN, hypothyroidism, DM2, anemia, CKD 3b who presented after sustaining a fall at home. There was concern that she may have had a syncopal episode. Patient was noted to be dehydrated. Found to have L1 compression fracture. Was hospitalized for further management and the case was discussed with neurosurgery by the EDP.  The neurosurgery team recommended an LSO brace and outpatient follow-up.  Patient also described vertiginous symptoms and no focal deficits and head CT showed no acute findings.  This improved with meclizine  and it was thought that the patient may have syncopized but there were no arrhythmias identified and echocardiogram showed normal LVEF.  She had no further episodes while she was hospitalized and was able to ambulate without difficulty but PT/OT recommended skilled training and rehabilitation.  She is medically stable for discharge at this time will need follow-up with PCP, neurosurgery and cardiology in the outpatient setting.  Assessment and Plan:  Syncope: Possibly in the setting of  dehydration and AKI.  UA was unremarkable etiology unclear but No arrhythmias identified. Troponin I went from 26 -> 28. Echocardiogram shows normal LVEF 60 to 65% with no regional wall motion abnormalities.  Head CT done and showed no acute intracranial abnormality. No skull fracture and there is also Chronic small vessel ischemia. No further episodes of syncope in the hospital.  Has been able to ambulate without difficulty but PT/OT recommending SNF.   Vertigo: Describes vertiginous symptoms.  No focal neurological deficits noted.  CT head done at the time of admission did not show any acute findings.She has had vertigo previously. Seems to have improved with Meclizine  25 mg po TID   Acute Kidney Injury on Chronic Kidney Disease Stage IIIb: Baseline creatinine seems to be around 1.6.  Came in with a creatinine of around 2.  Improved with hydration as current BUN/Cr Trend:  Recent Labs  Lab 09/17/23 1557 09/18/23 0026 09/18/23 0644 09/19/23 0553  BUN 39* 36* 33* 22  CREATININE 2.15* 1.70* 1.69* 1.16*  -Avoid Nephrotoxic Medications, Contrast Dyes, Hypotension and Dehydration to Ensure Adequate Renal Perfusion and will need to Renally Adjust Meds. CTM and Trend Renal Function carefully and repeat CMP w/in 1 week   L1 Compression Fracture: Case was discussed with neurosurgery by EDP.  They recommended LSO brace and outpatient follow-up.  No role for any procedures in the hospital at this time.  Patient does not have any neurological deficits.  SNF is recommended for short-term rehab. Acetaminophen  500 mg po TID on a scheduled basis for a few days.  Continue with Hydrocodone -Acetaminophen  5-325 mg 1-2 tab q4h as needed  and IV 0.5 Hydromorphone  q4hprn Severe Pain; C/w Methocarbamol  500 mg po q8hprn Muscle Spasms   Essential Hypertension: Continue Amlodipine  5 mg po Daily  ARB placed on hold.  HCTZ placed on hold and will not continue for now.  Wanted to blood pressure trends.  Can increase the dose of  amlodipine  if needed. CTM BP per Protocol. Last BP reading was 157/69   Hypothyroidism: C/w Levothyroxine  25 mcg po Daily    Diabetes Mellitus Type 2: HbA1c was 6.6.  Continue with sensitive NovoLog /scale insulin  q4h. Does not take any medications at home for same. CBG Trend:  Recent Labs  Lab 09/20/23 1638 09/20/23 2004 09/20/23 2023 09/20/23 2345 09/21/23 0404 09/21/23 0724 09/21/23 1141  GLUCAP 137* 95 94 80 83 84 102*  -CTM and Trend Blood Sugars per Protocol.    Normocytic Anemia: Hemoglobin/Hct is stable with current trend:  Recent Labs  Lab 09/17/23 1557 09/18/23 0644 09/19/23 0553  HGB 10.9* 10.7* 10.3*  HCT 34.3* 34.7* 32.6*  MCV 89.3 90.8 90.1  -Anemia panel done and showed an iron level of 18, UIBC of 297, TIBC 315, saturation ratios of 6%, ferritin level 212, folate of 14.5 and vitamin B12 level 500. CTM for S/Sx of Bleeding; No overt bleeding noted. Repeat CBC w/in 1 week   Leukocytosis: Most likely reactive.  UA was unremarkable.  She was afebrile.  Now resolved as WBC Trend:  Recent Labs  Lab 09/17/23 1557 09/18/23 0644 09/19/23 0553  WBC 19.8* 12.2* 9.2  -CTM and Trend and repeat WBC w/in 1 week   Vitamin D  Deficiency: Vitamin D , 25-Hydroxy was 21.49. Started vitamin D  supplementation w/ Ergocalciferol  50,000 units po q7 Days  Overweight: Complicates overall prognosis and care. Estimated body mass index is 25.28 kg/m as calculated from the following:   Height as of this encounter: 5' 4 (1.626 m).   Weight as of this encounter: 66.8 kg. Weight Loss and Dietary Counseling givenConsultants: Phone discussion with Neurosurgery by EDP  Procedures performed: ECHOCARDIOGRAM   Disposition: Skilled nursing facility Diet recommendation:  Discharge Diet Orders (From admission, onward)     Start     Ordered   09/20/23 0000  Diet - low sodium heart healthy        09/20/23 0935           Cardiac and Carb modified diet DISCHARGE MEDICATION: Allergies as of  09/21/2023       Reactions   Avelox [moxifloxacin Hcl In Nacl] Shortness Of Breath   Mucinex [guaifenesin Er] Other (See Comments)   Makes my heart flutter   Other    Other reaction(s): Unknown   Sulfa  Antibiotics Hives   Albuterol  Swelling, Palpitations   Cefdinir Rash        Medication List     STOP taking these medications    benazepril  20 MG tablet Commonly known as: LOTENSIN    furosemide  20 MG tablet Commonly known as: LASIX    hydrochlorothiazide  12.5 MG capsule Commonly known as: MICROZIDE    traMADol  50 MG tablet Commonly known as: ULTRAM        TAKE these medications    acetaminophen  500 MG tablet Commonly known as: TYLENOL  Take 1 tablet (500 mg total) by mouth 3 (three) times daily for 3 days.   ALPRAZolam  0.25 MG tablet Commonly known as: XANAX  Take 1 tablet (0.25 mg total) by mouth 2 (two) times daily as needed for anxiety.   amLODipine  5 MG tablet Commonly known as: NORVASC  Take 1 tablet (5 mg total) by  mouth daily.   carboxymethylcellulose 0.5 % Soln Commonly known as: REFRESH PLUS 1 drop 3 (three) times daily as needed.   feeding supplement Liqd Take 237 mLs by mouth 2 (two) times daily between meals.   gabapentin  300 MG capsule Commonly known as: NEURONTIN  TAKE 1 CAPSULE BY MOUTH 3 TIMES  DAILY What changed: when to take this   HYDROcodone -acetaminophen  5-325 MG tablet Commonly known as: NORCO/VICODIN Take 1-2 tablets by mouth every 4 (four) hours as needed for moderate pain (pain score 4-6).   ketoconazole  2 % cream Commonly known as: NIZORAL  Apply 1 Application topically daily.   levothyroxine  25 MCG tablet Commonly known as: SYNTHROID  Take 1 tablet (25 mcg total) by mouth daily before breakfast.   Linzess  145 MCG Caps capsule Generic drug: linaclotide  TAKE 1 CAPSULE BY MOUTH DAILY  BEFORE BREAKFAST   meclizine  25 MG tablet Commonly known as: ANTIVERT  Take 1 tablet (25 mg total) by mouth 3 (three) times daily.   meloxicam   15 MG tablet Commonly known as: MOBIC  Take once a day as needed for arthritis pain. Take with a meal   methocarbamol  500 MG tablet Commonly known as: ROBAXIN  Take 1 tablet (500 mg total) by mouth every 8 (eight) hours as needed for muscle spasms.   polyethylene glycol 17 g packet Commonly known as: MIRALAX  / GLYCOLAX  Take 17 g by mouth daily as needed for severe constipation.   rosuvastatin  40 MG tablet Commonly known as: CRESTOR  TAKE 1 TABLET BY MOUTH DAILY   Vitamin D  (Ergocalciferol ) 1.25 MG (50000 UNIT) Caps capsule Commonly known as: DRISDOL  Take 1 capsule (50,000 Units total) by mouth every 7 (seven) days. Start taking on: September 25, 2023        Contact information for follow-up providers     Jennetta Sayres D, NP Follow up.   Specialty: Neurosurgery Why: post hospitalization follow up for l1 compression fx Contact information: [address] [place] [address] [phone]         Louis Shove, MD .   Specialty: Neurosurgery Contact information: [address] [place] [address] [phone]         Madison Hernandez ORN, MD. Schedule an appointment as soon as possible for a visit in 1 week(s).   Specialty: Family Medicine Why: post hospitalization follow up Contact information: [address] [place] [facility] [place] [address] [phone]              Contact information for after-discharge care     Destination     [facility] .   Service: Skilled Nursing Contact information: [address] [place]  [address] [phone]                    Discharge Exam: Madison Hernandez   09/17/23 2317  Weight: 66.8 kg   Vitals:   09/21/23 0756 09/21/23 1101  BP: (!) 159/68 (!) 157/69  Pulse: 88   Resp:    Temp: 98.5 F (36.9 C)   SpO2: 98%    Examination: Physical Exam:  Constitutional: WN/WD elderly Caucasian female no acute distress Respiratory: Diminished to auscultation  bilaterally, no wheezing, rales, rhonchi or crackles. Normal respiratory effort and patient is not tachypenic. No accessory muscle use.  Unlabored breathing Cardiovascular: RRR, no murmurs / rubs / gallops. S1 and S2 auscultated.  Mild extremity edema Abdomen: Soft, non-tender, slightly distended secondary but habitus. Bowel sounds positive.  GU: Deferred. Musculoskeletal: No clubbing / cyanosis of digits/nails.  No joint deformity upper and lower extremities. Skin: No rashes, lesions, ulcers limited skin evaluation. No induration; Warm and dry.  Neurologic: CN 2-12 grossly intact with no focal deficits. Romberg sign and cerebellar reflexes not assessed.  Psychiatric: Normal judgment and insight. Alert and oriented x 3. Normal mood and appropriate affect.   Condition at discharge: stable  The results of significant diagnostics from this hospitalization (including imaging, microbiology, ancillary and laboratory) are listed below for reference.   Imaging Studies: ECHOCARDIOGRAM COMPLETE Result Date: 09/18/2023    ECHOCARDIOGRAM REPORT   Patient Name:   Madison Hernandez Date of Exam: 09/18/2023 Medical Rec #:  [id number]       Height:       64.0 in Accession #:    [id number]      Weight:       147.3 lb Date of Birth:  04-10-1940       BSA:          1.718 m Patient Age:    83 years        BP:           121/69 mmHg Patient Gender: F               HR:           59 bpm. Exam Location:  Inpatient Procedure: 2D Echo, Cardiac Doppler and Color Doppler (Both Spectral and Color            Flow Doppler were utilized during procedure). Indications:    Syncope  History:        Patient has prior history of Echocardiogram examinations, most                 recent 06/24/2016. Risk Factors:Hypertension, Dyslipidemia and                 Diabetes.  Sonographer:    Therisa Crouch Referring Phys: [id number] ANASTASSIA DOUTOVA IMPRESSIONS  1. Left ventricular ejection fraction, by estimation, is 60 to 65%. The left ventricle has normal  function. The left ventricle has no regional wall motion abnormalities. There is mild concentric left ventricular hypertrophy. Left ventricular diastolic parameters are indeterminate.  2. Right ventricular systolic function is normal. The right ventricular size is normal.  3. The mitral valve is normal in structure. No evidence of mitral valve regurgitation. No evidence of mitral stenosis.  4. Tricuspid valve regurgitation is mild to moderate.  5. The aortic valve is normal in structure. Aortic valve regurgitation is trivial. No aortic stenosis is present.  6. The inferior vena cava is normal in size with greater than 50% respiratory variability, suggesting right atrial pressure of 3 mmHg. FINDINGS  Left Ventricle: Left ventricular ejection fraction, by estimation, is 60 to 65%. The left ventricle has normal function. The left ventricle has no regional wall motion abnormalities. The left ventricular internal cavity size was normal in size. There is  mild concentric left ventricular hypertrophy. Left ventricular diastolic parameters are indeterminate. Right Ventricle: The right ventricular size is normal. No increase in right ventricular wall thickness. Right ventricular systolic function is normal. Left Atrium: Left atrial size was normal in size. Right Atrium: Right atrial size was normal in size. Pericardium: There is no evidence of pericardial effusion. Presence of epicardial fat layer. Mitral Valve: The mitral valve is normal in structure. No evidence of mitral valve regurgitation. No evidence of mitral valve stenosis. Tricuspid Valve: The tricuspid valve is normal in structure. Tricuspid valve regurgitation is mild to  moderate. No evidence of tricuspid stenosis. Aortic Valve: The aortic valve is normal in structure. Aortic valve regurgitation is trivial. No aortic stenosis is present. Aortic valve mean gradient measures 4.0 mmHg. Aortic valve peak gradient measures 8.4 mmHg. Aortic valve area, by VTI measures  3.39 cm. Pulmonic Valve: The pulmonic valve was normal in structure. Pulmonic valve regurgitation is trivial. No evidence of pulmonic stenosis. Aorta: The aortic root is normal in size and structure. Venous: The inferior vena cava is normal in size with greater than 50% respiratory variability, suggesting right atrial pressure of 3 mmHg. IAS/Shunts: No atrial level shunt detected by color flow Doppler.  LEFT VENTRICLE PLAX 2D LVIDd:         4.10 cm   Diastology LVIDs:         2.50 cm   LV e' medial:    5.98 cm/s LV PW:         1.20 cm   LV E/e' medial:  8.9 LV IVS:        1.10 cm   LV e' lateral:   9.25 cm/s LVOT diam:     2.10 cm   LV E/e' lateral: 5.7 LV SV:         81 LV SV Index:   47 LVOT Area:     3.46 cm  RIGHT VENTRICLE             IVC RV Basal diam:  3.20 cm     IVC diam: 1.30 cm RV S prime:     16.80 cm/s TAPSE (M-mode): 2.4 cm LEFT ATRIUM             Index        RIGHT ATRIUM           Index LA diam:        3.40 cm 1.98 cm/m   RA Area:     15.60 cm LA Vol (A2C):   68.9 ml 40.11 ml/m  RA Volume:   36.60 ml  21.31 ml/m LA Vol (A4C):   32.3 ml 18.80 ml/m LA Biplane Vol: 49.9 ml 29.05 ml/m  AORTIC VALVE AV Area (Vmax):    2.68 cm AV Area (Vmean):   2.89 cm AV Area (VTI):     3.39 cm AV Vmax:           145.00 cm/s AV Vmean:          92.300 cm/s AV VTI:            0.239 m AV Peak Grad:      8.4 mmHg AV Mean Grad:      4.0 mmHg LVOT Vmax:         112.00 cm/s LVOT Vmean:        77.100 cm/s LVOT VTI:          0.234 m LVOT/AV VTI ratio: 0.98  AORTA Ao Asc diam: 2.90 cm MITRAL VALVE               TRICUSPID VALVE MV Area (PHT): 2.50 cm    TR Peak grad:   25.8 mmHg MV E velocity: 53.10 cm/s  TR Vmax:        254.00 cm/s MV A velocity: 78.80 cm/s MV E/A ratio:  0.67        SHUNTS                            Systemic VTI:  0.23 m  Systemic Diam: 2.10 cm Dub Tobb DO Electronically signed by Dub Huntsman DO Signature Date/Time: 09/18/2023/4:27:10 PM    Final    CT Lumbar Spine Wo  Contrast Result Date: 09/17/2023 CLINICAL DATA:  Lumbar radiculopathy.  Trip and fall.  Back pain. EXAM: CT LUMBAR SPINE WITHOUT CONTRAST TECHNIQUE: Multidetector CT imaging of the lumbar spine was performed without intravenous contrast administration. Multiplanar CT image reconstructions were also generated. RADIATION DOSE REDUCTION: This exam was performed according to the departmental dose-optimization program which includes automated exposure control, adjustment of the mA and/or kV according to patient size and/or use of iterative reconstruction technique. COMPARISON:  None Available. FINDINGS: Segmentation: 5 lumbar type vertebrae. Alignment: Normal. Vertebrae: Comminuted fracture through the anterior middle columns of L1 vertebra. There is mild displacement of the superior endplate. 25% loss of height centrally. There is slight buckling of the posterior cortex. No extension into the lamina or pedicles. Prominent Schmorl's node involving superior endplate of T12 is chronic when compared to prior thoracic spine MRI. The bones are subjectively under mineralized. No additional acute fracture. The included sacrum is intact. Paraspinal and other soft tissues: Minimal L1 perispinal soft tissue thickening at site of fracture. Aortic atherosclerosis. Colonic diverticulosis. Disc levels: Moderate degenerative disc disease at L2-L3 and L5-S1. Multilevel facet hypertrophy. IMPRESSION: 1. Comminuted fracture through the anterior and middle columns of L1 vertebra with 25% loss of height centrally. Slight buckling of the posterior cortex. No extension into the lamina or pedicles. 2. Moderate degenerative disc disease at L2-L3 and L5-S1. Multilevel facet hypertrophy. Aortic Atherosclerosis (ICD10-I70.0). Electronically Signed   By: Andrea Gasman M.D.   On: 09/17/2023 17:27   CT Cervical Spine Wo Contrast Result Date: 09/17/2023 CLINICAL DATA:  Neck trauma (Age >= 65y) Trip and fall. EXAM: CT CERVICAL SPINE WITHOUT  CONTRAST TECHNIQUE: Multidetector CT imaging of the cervical spine was performed without intravenous contrast. Multiplanar CT image reconstructions were also generated. RADIATION DOSE REDUCTION: This exam was performed according to the departmental dose-optimization program which includes automated exposure control, adjustment of the mA and/or kV according to patient size and/or use of iterative reconstruction technique. COMPARISON:  None Available. FINDINGS: Alignment: No traumatic subluxation. Trace anterolisthesis of C3 on C4 and C5 on C6 likely due to facet hypertrophy. Skull base and vertebrae: No acute fracture. Cervical vertebral body heights are maintained. Chronic mild T2 and T3 compression deformities, unchanged from 2018 thoracic spine MRI. The dens and skull base are intact. Soft tissues and spinal canal: No prevertebral fluid or swelling. No visible canal hematoma. Disc levels: Mild multilevel degenerative disc disease with moderate diffuse facet hypertrophy. Upper chest: No acute findings. Right thyroid  nodule was previously assessed on thyroid  ultrasound. Other: None. IMPRESSION: 1. No acute fracture or traumatic subluxation of the cervical spine. 2. Multilevel degenerative disc disease and facet hypertrophy. Electronically Signed   By: Andrea Gasman M.D.   On: 09/17/2023 17:22   CT Head Wo Contrast Result Date: 09/17/2023 CLINICAL DATA:  Trip and fall. EXAM: CT HEAD WITHOUT CONTRAST TECHNIQUE: Contiguous axial images were obtained from the base of the skull through the vertex without intravenous contrast. RADIATION DOSE REDUCTION: This exam was performed according to the departmental dose-optimization program which includes automated exposure control, adjustment of the mA and/or kV according to patient size and/or use of iterative reconstruction technique. COMPARISON:  Head CT 08/05/2016 FINDINGS: Brain: No intracranial hemorrhage, mass effect, or midline shift. Brain volume is normal for age.  No hydrocephalus. The basilar cisterns are patent. Periventricular and  deep white matter hypodensity, nonspecific but typical of chronic small vessel ischemia. Mild progression from remote exam. No evidence of territorial infarct or acute ischemia. No extra-axial or intracranial fluid collection. Vascular: Atherosclerosis of skullbase vasculature without hyperdense vessel or abnormal calcification. Skull: No fracture or focal lesion. Sinuses/Orbits: No acute finding. Other: No confluent scalp hematoma. IMPRESSION: 1. No acute intracranial abnormality. No skull fracture. 2. Chronic small vessel ischemia. Electronically Signed   By: Andrea Gasman M.D.   On: 09/17/2023 17:17   DG Hip Unilat With Pelvis 2-3 Views Left Result Date: 09/17/2023 CLINICAL DATA:  Trip and fall with bilateral hip pain. EXAM: DG HIP (WITH OR WITHOUT PELVIS) 2-3V LEFT; DG HIP (WITH OR WITHOUT PELVIS) 2-3V RIGHT COMPARISON:  None Available. FINDINGS: No acute fracture of the pelvis or hips. No hip dislocation. There is bilateral hip joint space narrowing with acetabular spurring. Pubic rami are intact. Pubic symphysis and sacroiliac joints are congruent. No gross disruption of sacral ala. No erosive change. Pelvic phleboliths. IMPRESSION: 1. No acute fracture of the pelvis or hips. 2. Mild bilateral hip osteoarthritis. Electronically Signed   By: Andrea Gasman M.D.   On: 09/17/2023 15:13   DG Hip Unilat  With Pelvis 2-3 Views Right Result Date: 09/17/2023 CLINICAL DATA:  Trip and fall with bilateral hip pain. EXAM: DG HIP (WITH OR WITHOUT PELVIS) 2-3V LEFT; DG HIP (WITH OR WITHOUT PELVIS) 2-3V RIGHT COMPARISON:  None Available. FINDINGS: No acute fracture of the pelvis or hips. No hip dislocation. There is bilateral hip joint space narrowing with acetabular spurring. Pubic rami are intact. Pubic symphysis and sacroiliac joints are congruent. No gross disruption of sacral ala. No erosive change. Pelvic phleboliths. IMPRESSION: 1. No  acute fracture of the pelvis or hips. 2. Mild bilateral hip osteoarthritis. Electronically Signed   By: Andrea Gasman M.D.   On: 09/17/2023 15:13   Microbiology: Results for orders placed or performed in visit on 05/25/23  Urine Culture     Status: None   Collection Time: 05/25/23 11:13 AM  Result Value Ref Range Status   MICRO NUMBER: [id number]  Final   SPECIMEN QUALITY: Adequate  Final   Sample Source URINE  Final   STATUS: FINAL  Final   Result:   Final    Less than 10,000 CFU/mL of single Gram positive organism isolated. No further testing will be performed. If clinically indicated, recollection using a method to minimize contamination, with prompt transfer to Urine Culture Transport Tube, is recommended.   Labs: CBC: Recent Labs  Lab 09/17/23 1557 09/18/23 0644 09/19/23 0553  WBC 19.8* 12.2* 9.2  NEUTROABS 17.2*  --   --   HGB 10.9* 10.7* 10.3*  HCT 34.3* 34.7* 32.6*  MCV 89.3 90.8 90.1  PLT 312 257 189   Basic Metabolic Panel: Recent Labs  Lab 09/17/23 1557 09/18/23 0026 09/18/23 0644 09/19/23 0553  NA 133* 135 136 135  K 4.0 4.3 4.4 4.2  CL 97* 100 99 99  CO2 22 22 23  21*  GLUCOSE 122* 116* 91 109*  BUN 39* 36* 33* 22  CREATININE 2.15* 1.70* 1.69* 1.16*  CALCIUM  10.0 9.4 9.6 9.8  MG  --  2.0 2.1  --   PHOS  --  3.1 3.0  --    Liver Function Tests: Recent Labs  Lab 09/17/23 1557 09/18/23 0644  AST 26 28  ALT 20 20  ALKPHOS 135* 120  BILITOT 0.3 0.5  PROT 6.5 6.3*  ALBUMIN 4.1 4.0  CBG: Recent Labs  Lab 09/20/23 2023 09/20/23 2345 09/21/23 0404 09/21/23 0724 09/21/23 1141  GLUCAP 94 80 83 84 102*   Discharge time spent: greater than 30 minutes.  Signed: Alejandro Marker, DO [facility] 09/21/2023

## 2023-09-22 DIAGNOSIS — E559 Vitamin D deficiency, unspecified: Secondary | ICD-10-CM | POA: Diagnosis not present

## 2023-09-22 DIAGNOSIS — S32010D Wedge compression fracture of first lumbar vertebra, subsequent encounter for fracture with routine healing: Secondary | ICD-10-CM | POA: Diagnosis not present

## 2023-09-22 DIAGNOSIS — R2689 Other abnormalities of gait and mobility: Secondary | ICD-10-CM | POA: Diagnosis not present

## 2023-09-22 DIAGNOSIS — M6281 Muscle weakness (generalized): Secondary | ICD-10-CM | POA: Diagnosis not present

## 2023-09-22 DIAGNOSIS — G909 Disorder of the autonomic nervous system, unspecified: Secondary | ICD-10-CM | POA: Diagnosis not present

## 2023-09-22 DIAGNOSIS — I10 Essential (primary) hypertension: Secondary | ICD-10-CM | POA: Diagnosis not present

## 2023-09-22 DIAGNOSIS — E782 Mixed hyperlipidemia: Secondary | ICD-10-CM | POA: Diagnosis not present

## 2023-09-23 DIAGNOSIS — E559 Vitamin D deficiency, unspecified: Secondary | ICD-10-CM | POA: Diagnosis not present

## 2023-09-23 DIAGNOSIS — S32010D Wedge compression fracture of first lumbar vertebra, subsequent encounter for fracture with routine healing: Secondary | ICD-10-CM | POA: Diagnosis not present

## 2023-09-23 DIAGNOSIS — G909 Disorder of the autonomic nervous system, unspecified: Secondary | ICD-10-CM | POA: Diagnosis not present

## 2023-09-23 DIAGNOSIS — M6281 Muscle weakness (generalized): Secondary | ICD-10-CM | POA: Diagnosis not present

## 2023-09-23 DIAGNOSIS — I10 Essential (primary) hypertension: Secondary | ICD-10-CM | POA: Diagnosis not present

## 2023-09-23 DIAGNOSIS — E782 Mixed hyperlipidemia: Secondary | ICD-10-CM | POA: Diagnosis not present

## 2023-09-26 DIAGNOSIS — E782 Mixed hyperlipidemia: Secondary | ICD-10-CM | POA: Diagnosis not present

## 2023-09-26 DIAGNOSIS — M6281 Muscle weakness (generalized): Secondary | ICD-10-CM | POA: Diagnosis not present

## 2023-09-26 DIAGNOSIS — G909 Disorder of the autonomic nervous system, unspecified: Secondary | ICD-10-CM | POA: Diagnosis not present

## 2023-09-26 DIAGNOSIS — R2689 Other abnormalities of gait and mobility: Secondary | ICD-10-CM | POA: Diagnosis not present

## 2023-09-26 DIAGNOSIS — E559 Vitamin D deficiency, unspecified: Secondary | ICD-10-CM | POA: Diagnosis not present

## 2023-09-26 DIAGNOSIS — S32010D Wedge compression fracture of first lumbar vertebra, subsequent encounter for fracture with routine healing: Secondary | ICD-10-CM | POA: Diagnosis not present

## 2023-09-26 DIAGNOSIS — I10 Essential (primary) hypertension: Secondary | ICD-10-CM | POA: Diagnosis not present

## 2023-09-28 DIAGNOSIS — E559 Vitamin D deficiency, unspecified: Secondary | ICD-10-CM | POA: Diagnosis not present

## 2023-09-28 DIAGNOSIS — M6281 Muscle weakness (generalized): Secondary | ICD-10-CM | POA: Diagnosis not present

## 2023-09-28 DIAGNOSIS — I10 Essential (primary) hypertension: Secondary | ICD-10-CM | POA: Diagnosis not present

## 2023-09-28 DIAGNOSIS — E782 Mixed hyperlipidemia: Secondary | ICD-10-CM | POA: Diagnosis not present

## 2023-09-28 DIAGNOSIS — S32010D Wedge compression fracture of first lumbar vertebra, subsequent encounter for fracture with routine healing: Secondary | ICD-10-CM | POA: Diagnosis not present

## 2023-09-28 DIAGNOSIS — G909 Disorder of the autonomic nervous system, unspecified: Secondary | ICD-10-CM | POA: Diagnosis not present

## 2023-09-29 DIAGNOSIS — M6281 Muscle weakness (generalized): Secondary | ICD-10-CM | POA: Diagnosis not present

## 2023-09-29 DIAGNOSIS — E782 Mixed hyperlipidemia: Secondary | ICD-10-CM | POA: Diagnosis not present

## 2023-09-29 DIAGNOSIS — G909 Disorder of the autonomic nervous system, unspecified: Secondary | ICD-10-CM | POA: Diagnosis not present

## 2023-09-29 DIAGNOSIS — R2689 Other abnormalities of gait and mobility: Secondary | ICD-10-CM | POA: Diagnosis not present

## 2023-09-29 DIAGNOSIS — S32010D Wedge compression fracture of first lumbar vertebra, subsequent encounter for fracture with routine healing: Secondary | ICD-10-CM | POA: Diagnosis not present

## 2023-09-29 DIAGNOSIS — E559 Vitamin D deficiency, unspecified: Secondary | ICD-10-CM | POA: Diagnosis not present

## 2023-09-29 DIAGNOSIS — I10 Essential (primary) hypertension: Secondary | ICD-10-CM | POA: Diagnosis not present

## 2023-09-30 ENCOUNTER — Ambulatory Visit: Admitting: Family Medicine

## 2023-09-30 DIAGNOSIS — E559 Vitamin D deficiency, unspecified: Secondary | ICD-10-CM | POA: Diagnosis not present

## 2023-09-30 DIAGNOSIS — M6281 Muscle weakness (generalized): Secondary | ICD-10-CM | POA: Diagnosis not present

## 2023-09-30 DIAGNOSIS — E782 Mixed hyperlipidemia: Secondary | ICD-10-CM | POA: Diagnosis not present

## 2023-09-30 DIAGNOSIS — I10 Essential (primary) hypertension: Secondary | ICD-10-CM | POA: Diagnosis not present

## 2023-09-30 DIAGNOSIS — S32010D Wedge compression fracture of first lumbar vertebra, subsequent encounter for fracture with routine healing: Secondary | ICD-10-CM | POA: Diagnosis not present

## 2023-09-30 DIAGNOSIS — S32010G Wedge compression fracture of first lumbar vertebra, subsequent encounter for fracture with delayed healing: Secondary | ICD-10-CM | POA: Diagnosis not present

## 2023-09-30 DIAGNOSIS — G909 Disorder of the autonomic nervous system, unspecified: Secondary | ICD-10-CM | POA: Diagnosis not present

## 2023-10-03 DIAGNOSIS — M6281 Muscle weakness (generalized): Secondary | ICD-10-CM | POA: Diagnosis not present

## 2023-10-03 DIAGNOSIS — R2689 Other abnormalities of gait and mobility: Secondary | ICD-10-CM | POA: Diagnosis not present

## 2023-10-03 DIAGNOSIS — I10 Essential (primary) hypertension: Secondary | ICD-10-CM | POA: Diagnosis not present

## 2023-10-03 DIAGNOSIS — E559 Vitamin D deficiency, unspecified: Secondary | ICD-10-CM | POA: Diagnosis not present

## 2023-10-03 DIAGNOSIS — E782 Mixed hyperlipidemia: Secondary | ICD-10-CM | POA: Diagnosis not present

## 2023-10-03 DIAGNOSIS — G909 Disorder of the autonomic nervous system, unspecified: Secondary | ICD-10-CM | POA: Diagnosis not present

## 2023-10-03 DIAGNOSIS — S32010D Wedge compression fracture of first lumbar vertebra, subsequent encounter for fracture with routine healing: Secondary | ICD-10-CM | POA: Diagnosis not present

## 2023-10-05 DIAGNOSIS — G909 Disorder of the autonomic nervous system, unspecified: Secondary | ICD-10-CM | POA: Diagnosis not present

## 2023-10-05 DIAGNOSIS — M6281 Muscle weakness (generalized): Secondary | ICD-10-CM | POA: Diagnosis not present

## 2023-10-05 DIAGNOSIS — E559 Vitamin D deficiency, unspecified: Secondary | ICD-10-CM | POA: Diagnosis not present

## 2023-10-05 DIAGNOSIS — S32010D Wedge compression fracture of first lumbar vertebra, subsequent encounter for fracture with routine healing: Secondary | ICD-10-CM | POA: Diagnosis not present

## 2023-10-05 DIAGNOSIS — I10 Essential (primary) hypertension: Secondary | ICD-10-CM | POA: Diagnosis not present

## 2023-10-05 DIAGNOSIS — E782 Mixed hyperlipidemia: Secondary | ICD-10-CM | POA: Diagnosis not present

## 2023-10-06 DIAGNOSIS — R2689 Other abnormalities of gait and mobility: Secondary | ICD-10-CM | POA: Diagnosis not present

## 2023-10-06 DIAGNOSIS — S32010D Wedge compression fracture of first lumbar vertebra, subsequent encounter for fracture with routine healing: Secondary | ICD-10-CM | POA: Diagnosis not present

## 2023-10-06 DIAGNOSIS — M6281 Muscle weakness (generalized): Secondary | ICD-10-CM | POA: Diagnosis not present

## 2023-10-07 DIAGNOSIS — G909 Disorder of the autonomic nervous system, unspecified: Secondary | ICD-10-CM | POA: Diagnosis not present

## 2023-10-07 DIAGNOSIS — I10 Essential (primary) hypertension: Secondary | ICD-10-CM | POA: Diagnosis not present

## 2023-10-07 DIAGNOSIS — E559 Vitamin D deficiency, unspecified: Secondary | ICD-10-CM | POA: Diagnosis not present

## 2023-10-07 DIAGNOSIS — M6281 Muscle weakness (generalized): Secondary | ICD-10-CM | POA: Diagnosis not present

## 2023-10-07 DIAGNOSIS — S32010D Wedge compression fracture of first lumbar vertebra, subsequent encounter for fracture with routine healing: Secondary | ICD-10-CM | POA: Diagnosis not present

## 2023-10-07 DIAGNOSIS — E782 Mixed hyperlipidemia: Secondary | ICD-10-CM | POA: Diagnosis not present

## 2023-10-10 ENCOUNTER — Telehealth: Payer: Self-pay

## 2023-10-10 ENCOUNTER — Telehealth: Payer: Self-pay | Admitting: *Deleted

## 2023-10-10 DIAGNOSIS — Z9181 History of falling: Secondary | ICD-10-CM | POA: Diagnosis not present

## 2023-10-10 DIAGNOSIS — N179 Acute kidney failure, unspecified: Secondary | ICD-10-CM | POA: Diagnosis not present

## 2023-10-10 DIAGNOSIS — G43909 Migraine, unspecified, not intractable, without status migrainosus: Secondary | ICD-10-CM | POA: Diagnosis not present

## 2023-10-10 DIAGNOSIS — Z87891 Personal history of nicotine dependence: Secondary | ICD-10-CM | POA: Diagnosis not present

## 2023-10-10 DIAGNOSIS — E782 Mixed hyperlipidemia: Secondary | ICD-10-CM | POA: Diagnosis not present

## 2023-10-10 DIAGNOSIS — E1165 Type 2 diabetes mellitus with hyperglycemia: Secondary | ICD-10-CM | POA: Diagnosis not present

## 2023-10-10 DIAGNOSIS — S32010D Wedge compression fracture of first lumbar vertebra, subsequent encounter for fracture with routine healing: Secondary | ICD-10-CM | POA: Diagnosis not present

## 2023-10-10 DIAGNOSIS — D72829 Elevated white blood cell count, unspecified: Secondary | ICD-10-CM | POA: Diagnosis not present

## 2023-10-10 DIAGNOSIS — M1990 Unspecified osteoarthritis, unspecified site: Secondary | ICD-10-CM | POA: Diagnosis not present

## 2023-10-10 DIAGNOSIS — Z6824 Body mass index (BMI) 24.0-24.9, adult: Secondary | ICD-10-CM | POA: Diagnosis not present

## 2023-10-10 DIAGNOSIS — N1832 Chronic kidney disease, stage 3b: Secondary | ICD-10-CM | POA: Diagnosis not present

## 2023-10-10 DIAGNOSIS — I129 Hypertensive chronic kidney disease with stage 1 through stage 4 chronic kidney disease, or unspecified chronic kidney disease: Secondary | ICD-10-CM | POA: Diagnosis not present

## 2023-10-10 DIAGNOSIS — E039 Hypothyroidism, unspecified: Secondary | ICD-10-CM | POA: Diagnosis not present

## 2023-10-10 DIAGNOSIS — E1122 Type 2 diabetes mellitus with diabetic chronic kidney disease: Secondary | ICD-10-CM | POA: Diagnosis not present

## 2023-10-10 DIAGNOSIS — D631 Anemia in chronic kidney disease: Secondary | ICD-10-CM | POA: Diagnosis not present

## 2023-10-10 DIAGNOSIS — E559 Vitamin D deficiency, unspecified: Secondary | ICD-10-CM | POA: Diagnosis not present

## 2023-10-10 NOTE — Telephone Encounter (Signed)
 Transition Care Management Follow-up Telephone Call Date of discharge and from where: 10/07/23 [facility]  How have you been since you were released from the hospital? Better Any questions or concerns? No  Items Reviewed: Did the pt receive and understand the discharge instructions provided? Yes  Medications obtained and verified? Yes  Other?   Any new allergies since your discharge? No  Dietary orders reviewed? Yes Do you have support at home? Yes   Home Care and Equipment/Supplies: Were home health services ordered? yes If so, what is the name of the agency? Pt unsure of name   Has the agency set up a time to come to the patient's home? no Were any new equipment or medical supplies ordered?  Yes: LSO brace  What is the name of the medical supply agency?  Were you able to get the supplies/equipment? yes Do you have any questions related to the use of the equipment or supplies? No  Functional Questionnaire: (I = Independent and D = Dependent) ADLs: I  Bathing/Dressing- I  Meal Prep- I  Eating- I  Maintaining continence- I  Transferring/Ambulation- I (has walker but not using)   Managing Meds- I  Follow up appointments reviewed:  PCP Hospital f/u appt confirmed? Yes  Scheduled to see Dr. Micheal on 10/19/23 @ 3:15. Specialist Hospital f/u appt confirmed? N/a  Are transportation arrangements needed? No  If their condition worsens, is the pt aware to call PCP or go to the Emergency Dept.? Yes Was the patient provided with contact information for the PCP's office or ED? Yes Was to pt encouraged to call back with questions or concerns? Yes

## 2023-10-10 NOTE — Telephone Encounter (Signed)
 Copied from CRM #[id number]. Topic: Clinical - Home Health Verbal Orders >> Oct 10, 2023  2:48 PM Harlene ORN wrote: Caller/Agency: [facility] Callback Number: [phone] Service Requested: Physical Therapy Frequency: twice a week for three weeks, and one time a week for one week. Please call the number: [phone] Any new concerns about the patient? No

## 2023-10-12 DIAGNOSIS — E1165 Type 2 diabetes mellitus with hyperglycemia: Secondary | ICD-10-CM | POA: Diagnosis not present

## 2023-10-12 DIAGNOSIS — D72829 Elevated white blood cell count, unspecified: Secondary | ICD-10-CM | POA: Diagnosis not present

## 2023-10-12 DIAGNOSIS — E039 Hypothyroidism, unspecified: Secondary | ICD-10-CM | POA: Diagnosis not present

## 2023-10-12 DIAGNOSIS — N1832 Chronic kidney disease, stage 3b: Secondary | ICD-10-CM | POA: Diagnosis not present

## 2023-10-12 DIAGNOSIS — I129 Hypertensive chronic kidney disease with stage 1 through stage 4 chronic kidney disease, or unspecified chronic kidney disease: Secondary | ICD-10-CM | POA: Diagnosis not present

## 2023-10-12 DIAGNOSIS — Z87891 Personal history of nicotine dependence: Secondary | ICD-10-CM | POA: Diagnosis not present

## 2023-10-12 DIAGNOSIS — D631 Anemia in chronic kidney disease: Secondary | ICD-10-CM | POA: Diagnosis not present

## 2023-10-12 DIAGNOSIS — E559 Vitamin D deficiency, unspecified: Secondary | ICD-10-CM | POA: Diagnosis not present

## 2023-10-12 DIAGNOSIS — Z6824 Body mass index (BMI) 24.0-24.9, adult: Secondary | ICD-10-CM | POA: Diagnosis not present

## 2023-10-12 DIAGNOSIS — E782 Mixed hyperlipidemia: Secondary | ICD-10-CM | POA: Diagnosis not present

## 2023-10-12 DIAGNOSIS — S32010D Wedge compression fracture of first lumbar vertebra, subsequent encounter for fracture with routine healing: Secondary | ICD-10-CM | POA: Diagnosis not present

## 2023-10-12 DIAGNOSIS — N179 Acute kidney failure, unspecified: Secondary | ICD-10-CM | POA: Diagnosis not present

## 2023-10-12 DIAGNOSIS — M1990 Unspecified osteoarthritis, unspecified site: Secondary | ICD-10-CM | POA: Diagnosis not present

## 2023-10-12 DIAGNOSIS — G43909 Migraine, unspecified, not intractable, without status migrainosus: Secondary | ICD-10-CM | POA: Diagnosis not present

## 2023-10-12 DIAGNOSIS — Z9181 History of falling: Secondary | ICD-10-CM | POA: Diagnosis not present

## 2023-10-12 DIAGNOSIS — E1122 Type 2 diabetes mellitus with diabetic chronic kidney disease: Secondary | ICD-10-CM | POA: Diagnosis not present

## 2023-10-13 NOTE — Telephone Encounter (Signed)
 [facility]y provided [facility] for VO

## 2023-10-17 DIAGNOSIS — Z6824 Body mass index (BMI) 24.0-24.9, adult: Secondary | ICD-10-CM | POA: Diagnosis not present

## 2023-10-17 DIAGNOSIS — E559 Vitamin D deficiency, unspecified: Secondary | ICD-10-CM | POA: Diagnosis not present

## 2023-10-17 DIAGNOSIS — Z87891 Personal history of nicotine dependence: Secondary | ICD-10-CM | POA: Diagnosis not present

## 2023-10-17 DIAGNOSIS — E1122 Type 2 diabetes mellitus with diabetic chronic kidney disease: Secondary | ICD-10-CM | POA: Diagnosis not present

## 2023-10-17 DIAGNOSIS — E039 Hypothyroidism, unspecified: Secondary | ICD-10-CM | POA: Diagnosis not present

## 2023-10-17 DIAGNOSIS — M1990 Unspecified osteoarthritis, unspecified site: Secondary | ICD-10-CM | POA: Diagnosis not present

## 2023-10-17 DIAGNOSIS — E1165 Type 2 diabetes mellitus with hyperglycemia: Secondary | ICD-10-CM | POA: Diagnosis not present

## 2023-10-17 DIAGNOSIS — Z9181 History of falling: Secondary | ICD-10-CM | POA: Diagnosis not present

## 2023-10-17 DIAGNOSIS — G43909 Migraine, unspecified, not intractable, without status migrainosus: Secondary | ICD-10-CM | POA: Diagnosis not present

## 2023-10-17 DIAGNOSIS — N179 Acute kidney failure, unspecified: Secondary | ICD-10-CM | POA: Diagnosis not present

## 2023-10-17 DIAGNOSIS — N1832 Chronic kidney disease, stage 3b: Secondary | ICD-10-CM | POA: Diagnosis not present

## 2023-10-17 DIAGNOSIS — I129 Hypertensive chronic kidney disease with stage 1 through stage 4 chronic kidney disease, or unspecified chronic kidney disease: Secondary | ICD-10-CM | POA: Diagnosis not present

## 2023-10-17 DIAGNOSIS — E782 Mixed hyperlipidemia: Secondary | ICD-10-CM | POA: Diagnosis not present

## 2023-10-17 DIAGNOSIS — D631 Anemia in chronic kidney disease: Secondary | ICD-10-CM | POA: Diagnosis not present

## 2023-10-17 DIAGNOSIS — D72829 Elevated white blood cell count, unspecified: Secondary | ICD-10-CM | POA: Diagnosis not present

## 2023-10-17 DIAGNOSIS — S32010D Wedge compression fracture of first lumbar vertebra, subsequent encounter for fracture with routine healing: Secondary | ICD-10-CM | POA: Diagnosis not present

## 2023-10-19 ENCOUNTER — Ambulatory Visit: Admitting: Family Medicine

## 2023-10-19 ENCOUNTER — Encounter: Payer: Self-pay | Admitting: Family Medicine

## 2023-10-19 VITALS — BP 105/60 | HR 97 | Temp 97.7°F | Wt 141.8 lb

## 2023-10-19 DIAGNOSIS — N183 Chronic kidney disease, stage 3 unspecified: Secondary | ICD-10-CM | POA: Diagnosis not present

## 2023-10-19 DIAGNOSIS — D649 Anemia, unspecified: Secondary | ICD-10-CM | POA: Diagnosis not present

## 2023-10-19 DIAGNOSIS — S32010A Wedge compression fracture of first lumbar vertebra, initial encounter for closed fracture: Secondary | ICD-10-CM

## 2023-10-19 NOTE — Patient Instructions (Signed)
 Increase water intake/hydration

## 2023-10-19 NOTE — Progress Notes (Unsigned)
 Established Patient Office Visit  Subjective   Patient ID: Madison Hernandez, female    DOB: Feb 19, 1940  Age: 83 y.o. MRN: [id number]  Chief Complaint  Patient presents with   Hospitalization Follow-up    HPI  {History (Optional):23778} Madison Hernandez is seen for hospital follow-up regarding recent fall which occurred at home with L1 compression fracture.  She still having some pain although slightly improved.  She apparently is followed by pain management and on tramadol  but feels like this is not controlling her pain very well.    She has multiple chronic problems including history of hypertension, hyperlipidemia, hypothyroidism, type 2 diabetes, chronic kidney disease stage III.  She fell at home and source of her fall was unclear.  She was dehydrated at the time of her admission.  X-rays revealed L1 compression fracture.  Consultation with neurosurgery and they recommended LSO brace which she is wearing regularly.  CT head no acute findings.  Monitoring showed no arrhythmias.  Echocardiogram normal EF with no acute findings.  Patient went to rehab at [facility] for about 20 days and was just recently discharged.  She does admit that she does not drink a lot of fluids.  She was taken off benazepril  and HCTZ during her recent admission.  Does remain on amlodipine .  She seems somewhat unsure of medications today.  Recent acute on chronic kidney disease with initial creatinine 2.15 and BUN of 39 and improved to creatinine 1.16 after IV fluids and prior to discharge.  Past Medical History:  Diagnosis Date   Acute ischemic colitis 1/12   Arthritis    Bleeding ulcer    22 years ago   Blood in stool    Cardiac arrhythmia due to congenital heart disease    Depression    Diverticulitis    Dyslipidemia    Headache(784.0)    Hyperlipidemia    Hypertension    Migraine    UTI (urinary tract infection)    Past Surgical History:  Procedure Laterality Date   bleeding ulcers     22 years ago    vein procedures     right and left lower extremity vein procedures    reports that she quit smoking about 40 years ago. Her smoking use included cigarettes. She started smoking about 50 years ago. She has a 20 pack-year smoking history. She has never used smokeless tobacco. She reports that she does not drink alcohol and does not use drugs. family history includes Diabetes in her mother; Heart disease in her mother; Hypertension in her mother and sister. Allergies  Allergen Reactions   Avelox [Moxifloxacin Hcl In Nacl] Shortness Of Breath   Mucinex [Guaifenesin Er] Other (See Comments)    Makes my heart flutter   Other     Other reaction(s): Unknown   Sulfa  Antibiotics Hives   Albuterol  Swelling and Palpitations   Cefdinir Rash    Review of Systems  Constitutional:  Negative for chills, fever and malaise/fatigue.  Eyes:  Negative for blurred vision.  Respiratory:  Negative for shortness of breath.   Cardiovascular:  Negative for chest pain.  Gastrointestinal:  Negative for abdominal pain.  Genitourinary:  Negative for dysuria.  Musculoskeletal:  Positive for back pain.  Neurological:  Negative for dizziness, weakness and headaches.      Objective:     BP 105/60 (BP Location: Right Arm, Cuff Size: Normal)   Pulse 97   Temp 97.7 F (36.5 C) (Oral)   Wt 141 lb 12.8 oz (64.3 kg)  SpO2 94%   BMI 24.34 kg/m  {Vitals History (Optional):23777}  Physical Exam Vitals reviewed.  Constitutional:      General: She is not in acute distress.    Appearance: She is well-developed. She is not ill-appearing.  Eyes:     Pupils: Pupils are equal, round, and reactive to light.  Neck:     Thyroid : No thyromegaly.     Vascular: No JVD.  Cardiovascular:     Rate and Rhythm: Normal rate and regular rhythm.     Heart sounds:     No gallop.  Pulmonary:     Effort: Pulmonary effort is normal. No respiratory distress.     Breath sounds: Normal breath sounds. No wheezing or rales.   Musculoskeletal:     Cervical back: Neck supple.     Comments: Some mild tenderness over L1 region.  Neurological:     Mental Status: She is alert.      No results found for any visits on 10/19/23.  {Labs (Optional):23779}  The ASCVD Risk score (Arnett DK, et al., 2019) failed to calculate for the following reasons:   The 2019 ASCVD risk score is only valid for ages 48 to 72    Assessment & Plan:   #1 recent fall at home with acute compression fracture L1.  Patient still having pain but slightly improved.  She is currently on tramadol  and is advised to supplement with Tylenol  as needed.  Continue close follow-up with neurosurgery.  Discussed importance of getting adequate calcium  and vitamin D .  Sounds like she has very poor diet currently  #2 hypertension currently stable on amlodipine  5 mg daily.  No orthostatic change in blood pressure today.  Continue to hold HCTZ and benazepril  if blood pressure remains stable  #3 acute on chronic renal failure with recent creatinine bump to 2.15 related to dehydration.  Improved with fluids.  Recheck basic metabolic panel  #4 history of chronic normocytic anemia.  Recheck CBC today   Return in about 1 month (around 11/19/2023).    Wolm Scarlet, MD

## 2023-10-20 LAB — CBC WITH DIFFERENTIAL/PLATELET
Basophils Absolute: 0.1 K/uL (ref 0.0–0.1)
Basophils Relative: 1.3 % (ref 0.0–3.0)
Eosinophils Absolute: 0.3 K/uL (ref 0.0–0.7)
Eosinophils Relative: 3.5 % (ref 0.0–5.0)
HCT: 32.7 % — ABNORMAL LOW (ref 36.0–46.0)
Hemoglobin: 10.7 g/dL — ABNORMAL LOW (ref 12.0–15.0)
Lymphocytes Relative: 18.6 % (ref 12.0–46.0)
Lymphs Abs: 1.8 K/uL (ref 0.7–4.0)
MCHC: 32.8 g/dL (ref 30.0–36.0)
MCV: 87.4 fl (ref 78.0–100.0)
Monocytes Absolute: 0.6 K/uL (ref 0.1–1.0)
Monocytes Relative: 5.7 % (ref 3.0–12.0)
Neutro Abs: 6.9 K/uL (ref 1.4–7.7)
Neutrophils Relative %: 70.9 % (ref 43.0–77.0)
Platelets: 309 K/uL (ref 150.0–400.0)
RBC: 3.73 Mil/uL — ABNORMAL LOW (ref 3.87–5.11)
RDW: 13.9 % (ref 11.5–15.5)
WBC: 9.7 K/uL (ref 4.0–10.5)

## 2023-10-20 LAB — BASIC METABOLIC PANEL WITH GFR
BUN: 19 mg/dL (ref 6–23)
CO2: 26 meq/L (ref 19–32)
Calcium: 9.7 mg/dL (ref 8.4–10.5)
Chloride: 95 meq/L — ABNORMAL LOW (ref 96–112)
Creatinine, Ser: 1.58 mg/dL — ABNORMAL HIGH (ref 0.40–1.20)
GFR: 30.49 mL/min — ABNORMAL LOW (ref >60.00)
Glucose, Bld: 85 mg/dL (ref 70–99)
Potassium: 3.5 meq/L (ref 3.5–5.1)
Sodium: 134 meq/L — ABNORMAL LOW (ref 135–145)

## 2023-10-22 ENCOUNTER — Ambulatory Visit: Payer: Self-pay | Admitting: Family Medicine

## 2023-10-22 DIAGNOSIS — N183 Chronic kidney disease, stage 3 unspecified: Secondary | ICD-10-CM

## 2023-10-24 DIAGNOSIS — E559 Vitamin D deficiency, unspecified: Secondary | ICD-10-CM | POA: Diagnosis not present

## 2023-10-24 DIAGNOSIS — Z6824 Body mass index (BMI) 24.0-24.9, adult: Secondary | ICD-10-CM | POA: Diagnosis not present

## 2023-10-24 DIAGNOSIS — Z9181 History of falling: Secondary | ICD-10-CM | POA: Diagnosis not present

## 2023-10-24 DIAGNOSIS — D631 Anemia in chronic kidney disease: Secondary | ICD-10-CM | POA: Diagnosis not present

## 2023-10-24 DIAGNOSIS — Z87891 Personal history of nicotine dependence: Secondary | ICD-10-CM | POA: Diagnosis not present

## 2023-10-24 DIAGNOSIS — E1122 Type 2 diabetes mellitus with diabetic chronic kidney disease: Secondary | ICD-10-CM | POA: Diagnosis not present

## 2023-10-24 DIAGNOSIS — I129 Hypertensive chronic kidney disease with stage 1 through stage 4 chronic kidney disease, or unspecified chronic kidney disease: Secondary | ICD-10-CM | POA: Diagnosis not present

## 2023-10-24 DIAGNOSIS — S32010D Wedge compression fracture of first lumbar vertebra, subsequent encounter for fracture with routine healing: Secondary | ICD-10-CM | POA: Diagnosis not present

## 2023-10-24 DIAGNOSIS — M1990 Unspecified osteoarthritis, unspecified site: Secondary | ICD-10-CM | POA: Diagnosis not present

## 2023-10-24 DIAGNOSIS — G43909 Migraine, unspecified, not intractable, without status migrainosus: Secondary | ICD-10-CM | POA: Diagnosis not present

## 2023-10-24 DIAGNOSIS — N1832 Chronic kidney disease, stage 3b: Secondary | ICD-10-CM | POA: Diagnosis not present

## 2023-10-24 DIAGNOSIS — E782 Mixed hyperlipidemia: Secondary | ICD-10-CM | POA: Diagnosis not present

## 2023-10-24 DIAGNOSIS — E1165 Type 2 diabetes mellitus with hyperglycemia: Secondary | ICD-10-CM | POA: Diagnosis not present

## 2023-10-24 DIAGNOSIS — N179 Acute kidney failure, unspecified: Secondary | ICD-10-CM | POA: Diagnosis not present

## 2023-10-24 DIAGNOSIS — D72829 Elevated white blood cell count, unspecified: Secondary | ICD-10-CM | POA: Diagnosis not present

## 2023-10-24 DIAGNOSIS — E039 Hypothyroidism, unspecified: Secondary | ICD-10-CM | POA: Diagnosis not present

## 2023-10-27 DIAGNOSIS — M545 Low back pain, unspecified: Secondary | ICD-10-CM | POA: Diagnosis not present

## 2023-10-27 DIAGNOSIS — R2689 Other abnormalities of gait and mobility: Secondary | ICD-10-CM | POA: Diagnosis not present

## 2023-10-27 DIAGNOSIS — M1712 Unilateral primary osteoarthritis, left knee: Secondary | ICD-10-CM | POA: Diagnosis not present

## 2023-10-27 DIAGNOSIS — M25562 Pain in left knee: Secondary | ICD-10-CM | POA: Diagnosis not present

## 2023-10-27 DIAGNOSIS — Z9181 History of falling: Secondary | ICD-10-CM | POA: Diagnosis not present

## 2023-10-28 DIAGNOSIS — E559 Vitamin D deficiency, unspecified: Secondary | ICD-10-CM | POA: Diagnosis not present

## 2023-10-28 DIAGNOSIS — G43909 Migraine, unspecified, not intractable, without status migrainosus: Secondary | ICD-10-CM | POA: Diagnosis not present

## 2023-10-28 DIAGNOSIS — N179 Acute kidney failure, unspecified: Secondary | ICD-10-CM | POA: Diagnosis not present

## 2023-10-28 DIAGNOSIS — E1165 Type 2 diabetes mellitus with hyperglycemia: Secondary | ICD-10-CM | POA: Diagnosis not present

## 2023-10-28 DIAGNOSIS — D72829 Elevated white blood cell count, unspecified: Secondary | ICD-10-CM | POA: Diagnosis not present

## 2023-10-28 DIAGNOSIS — E782 Mixed hyperlipidemia: Secondary | ICD-10-CM | POA: Diagnosis not present

## 2023-10-28 DIAGNOSIS — I129 Hypertensive chronic kidney disease with stage 1 through stage 4 chronic kidney disease, or unspecified chronic kidney disease: Secondary | ICD-10-CM | POA: Diagnosis not present

## 2023-10-28 DIAGNOSIS — N1832 Chronic kidney disease, stage 3b: Secondary | ICD-10-CM | POA: Diagnosis not present

## 2023-10-28 DIAGNOSIS — S32010D Wedge compression fracture of first lumbar vertebra, subsequent encounter for fracture with routine healing: Secondary | ICD-10-CM | POA: Diagnosis not present

## 2023-10-28 DIAGNOSIS — E1122 Type 2 diabetes mellitus with diabetic chronic kidney disease: Secondary | ICD-10-CM | POA: Diagnosis not present

## 2023-10-28 DIAGNOSIS — M1990 Unspecified osteoarthritis, unspecified site: Secondary | ICD-10-CM | POA: Diagnosis not present

## 2023-10-28 DIAGNOSIS — Z87891 Personal history of nicotine dependence: Secondary | ICD-10-CM | POA: Diagnosis not present

## 2023-10-28 DIAGNOSIS — Z6824 Body mass index (BMI) 24.0-24.9, adult: Secondary | ICD-10-CM | POA: Diagnosis not present

## 2023-10-28 DIAGNOSIS — Z9181 History of falling: Secondary | ICD-10-CM | POA: Diagnosis not present

## 2023-10-28 DIAGNOSIS — E039 Hypothyroidism, unspecified: Secondary | ICD-10-CM | POA: Diagnosis not present

## 2023-10-28 DIAGNOSIS — D631 Anemia in chronic kidney disease: Secondary | ICD-10-CM | POA: Diagnosis not present

## 2023-10-31 DIAGNOSIS — N179 Acute kidney failure, unspecified: Secondary | ICD-10-CM | POA: Diagnosis not present

## 2023-10-31 DIAGNOSIS — M1990 Unspecified osteoarthritis, unspecified site: Secondary | ICD-10-CM | POA: Diagnosis not present

## 2023-10-31 DIAGNOSIS — G43909 Migraine, unspecified, not intractable, without status migrainosus: Secondary | ICD-10-CM | POA: Diagnosis not present

## 2023-10-31 DIAGNOSIS — Z87891 Personal history of nicotine dependence: Secondary | ICD-10-CM | POA: Diagnosis not present

## 2023-10-31 DIAGNOSIS — D631 Anemia in chronic kidney disease: Secondary | ICD-10-CM | POA: Diagnosis not present

## 2023-10-31 DIAGNOSIS — E782 Mixed hyperlipidemia: Secondary | ICD-10-CM | POA: Diagnosis not present

## 2023-10-31 DIAGNOSIS — E039 Hypothyroidism, unspecified: Secondary | ICD-10-CM | POA: Diagnosis not present

## 2023-10-31 DIAGNOSIS — Z9181 History of falling: Secondary | ICD-10-CM | POA: Diagnosis not present

## 2023-10-31 DIAGNOSIS — I129 Hypertensive chronic kidney disease with stage 1 through stage 4 chronic kidney disease, or unspecified chronic kidney disease: Secondary | ICD-10-CM | POA: Diagnosis not present

## 2023-10-31 DIAGNOSIS — S32010D Wedge compression fracture of first lumbar vertebra, subsequent encounter for fracture with routine healing: Secondary | ICD-10-CM | POA: Diagnosis not present

## 2023-10-31 DIAGNOSIS — E1122 Type 2 diabetes mellitus with diabetic chronic kidney disease: Secondary | ICD-10-CM | POA: Diagnosis not present

## 2023-10-31 DIAGNOSIS — E1165 Type 2 diabetes mellitus with hyperglycemia: Secondary | ICD-10-CM | POA: Diagnosis not present

## 2023-10-31 DIAGNOSIS — D72829 Elevated white blood cell count, unspecified: Secondary | ICD-10-CM | POA: Diagnosis not present

## 2023-10-31 DIAGNOSIS — N1832 Chronic kidney disease, stage 3b: Secondary | ICD-10-CM | POA: Diagnosis not present

## 2023-10-31 DIAGNOSIS — Z6824 Body mass index (BMI) 24.0-24.9, adult: Secondary | ICD-10-CM | POA: Diagnosis not present

## 2023-10-31 DIAGNOSIS — E559 Vitamin D deficiency, unspecified: Secondary | ICD-10-CM | POA: Diagnosis not present

## 2023-11-01 ENCOUNTER — Other Ambulatory Visit: Payer: Self-pay | Admitting: Family Medicine

## 2023-11-21 ENCOUNTER — Other Ambulatory Visit

## 2023-11-22 ENCOUNTER — Ambulatory Visit: Payer: Self-pay | Admitting: Family Medicine

## 2023-11-22 ENCOUNTER — Other Ambulatory Visit (INDEPENDENT_AMBULATORY_CARE_PROVIDER_SITE_OTHER)

## 2023-11-22 DIAGNOSIS — N183 Chronic kidney disease, stage 3 unspecified: Secondary | ICD-10-CM | POA: Diagnosis not present

## 2023-11-22 LAB — BASIC METABOLIC PANEL WITH GFR
BUN: 16 mg/dL (ref 6–23)
CO2: 26 meq/L (ref 19–32)
Calcium: 10 mg/dL (ref 8.4–10.5)
Chloride: 101 meq/L (ref 96–112)
Creatinine, Ser: 1.04 mg/dL (ref 0.40–1.20)
GFR: 50.34 mL/min — ABNORMAL LOW (ref >60.00)
Glucose, Bld: 109 mg/dL — ABNORMAL HIGH (ref 70–99)
Potassium: 3.5 meq/L (ref 3.5–5.1)
Sodium: 137 meq/L (ref 135–145)

## 2023-12-04 ENCOUNTER — Other Ambulatory Visit: Payer: Self-pay | Admitting: Family Medicine

## 2023-12-04 DIAGNOSIS — K5900 Constipation, unspecified: Secondary | ICD-10-CM
